# Patient Record
Sex: Female | Born: 1985
Health system: Southern US, Community
[De-identification: ages and names within clinical notes are randomized; demographics above are authoritative.]

## PROBLEM LIST (undated history)

## (undated) ENCOUNTER — Inpatient Hospital Stay (HOSPITAL_COMMUNITY): Payer: Self-pay

## (undated) DIAGNOSIS — R112 Nausea with vomiting, unspecified: Secondary | ICD-10-CM

## (undated) DIAGNOSIS — E063 Autoimmune thyroiditis: Secondary | ICD-10-CM

## (undated) DIAGNOSIS — F32A Depression, unspecified: Secondary | ICD-10-CM

## (undated) DIAGNOSIS — N979 Female infertility, unspecified: Secondary | ICD-10-CM

## (undated) DIAGNOSIS — K219 Gastro-esophageal reflux disease without esophagitis: Secondary | ICD-10-CM

## (undated) DIAGNOSIS — F458 Other somatoform disorders: Secondary | ICD-10-CM

## (undated) DIAGNOSIS — F419 Anxiety disorder, unspecified: Secondary | ICD-10-CM

## (undated) DIAGNOSIS — E349 Endocrine disorder, unspecified: Secondary | ICD-10-CM

## (undated) DIAGNOSIS — Z9889 Other specified postprocedural states: Secondary | ICD-10-CM

## (undated) DIAGNOSIS — E288 Other ovarian dysfunction: Secondary | ICD-10-CM

## (undated) DIAGNOSIS — N939 Abnormal uterine and vaginal bleeding, unspecified: Secondary | ICD-10-CM

## (undated) DIAGNOSIS — F329 Major depressive disorder, single episode, unspecified: Secondary | ICD-10-CM

## (undated) DIAGNOSIS — N912 Amenorrhea, unspecified: Secondary | ICD-10-CM

## (undated) HISTORY — DX: Endocrine disorder, unspecified: E34.9

## (undated) HISTORY — DX: Depression, unspecified: F32.A

## (undated) HISTORY — PX: WISDOM TOOTH EXTRACTION: SHX21

## (undated) HISTORY — DX: Amenorrhea, unspecified: N91.2

## (undated) HISTORY — PX: DILATION AND CURETTAGE OF UTERUS: SHX78

## (undated) HISTORY — PX: OTHER SURGICAL HISTORY: SHX169

## (undated) HISTORY — DX: Female infertility, unspecified: N97.9

## (undated) HISTORY — DX: Anxiety disorder, unspecified: F41.9

## (undated) HISTORY — PX: TONSILLECTOMY AND ADENOIDECTOMY: SUR1326

## (undated) HISTORY — PX: TONSILLECTOMY: SUR1361

## (undated) HISTORY — DX: Other ovarian dysfunction: E28.8

## (undated) HISTORY — DX: Abnormal uterine and vaginal bleeding, unspecified: N93.9

## (undated) HISTORY — DX: Major depressive disorder, single episode, unspecified: F32.9

---
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
## 2012-08-01 ENCOUNTER — Emergency Department (HOSPITAL_COMMUNITY)
Admission: EM | Admit: 2012-08-01 | Discharge: 2012-08-01 | Disposition: A | Discharge status: Home / Self Care | Payer: Managed Care, Other (non HMO) | Attending: Emergency Medicine | Admitting: Emergency Medicine

## 2012-08-01 ENCOUNTER — Encounter (HOSPITAL_COMMUNITY): Payer: Self-pay | Admitting: Emergency Medicine

## 2012-08-01 DIAGNOSIS — R0602 Shortness of breath: Secondary | ICD-10-CM | POA: Insufficient documentation

## 2012-08-01 DIAGNOSIS — R221 Localized swelling, mass and lump, neck: Secondary | ICD-10-CM | POA: Insufficient documentation

## 2012-08-01 DIAGNOSIS — R209 Unspecified disturbances of skin sensation: Secondary | ICD-10-CM | POA: Insufficient documentation

## 2012-08-01 DIAGNOSIS — R220 Localized swelling, mass and lump, head: Secondary | ICD-10-CM | POA: Insufficient documentation

## 2012-08-01 DIAGNOSIS — T7840XA Allergy, unspecified, initial encounter: Secondary | ICD-10-CM

## 2012-08-01 DIAGNOSIS — Z79899 Other long term (current) drug therapy: Secondary | ICD-10-CM | POA: Insufficient documentation

## 2012-08-01 DIAGNOSIS — R1310 Dysphagia, unspecified: Secondary | ICD-10-CM | POA: Insufficient documentation

## 2012-08-01 DIAGNOSIS — T4995XA Adverse effect of unspecified topical agent, initial encounter: Secondary | ICD-10-CM | POA: Insufficient documentation

## 2012-08-01 DIAGNOSIS — F17200 Nicotine dependence, unspecified, uncomplicated: Secondary | ICD-10-CM | POA: Insufficient documentation

## 2012-08-01 MED ORDER — PREDNISONE 20 MG PO TABS
60.0000 mg | ORAL_TABLET | Freq: Once | ORAL | Status: AC
  Administered 2012-08-01: 60 mg via ORAL
  Filled 2012-08-01: qty 3

## 2012-08-01 MED ORDER — EPINEPHRINE 0.3 MG/0.3ML IJ DEVI: 0.3000 mg | INTRAMUSCULAR | Status: DC | PRN

## 2012-08-01 MED ORDER — PREDNISONE 20 MG PO TABS: 60.0000 mg | ORAL_TABLET | Freq: Every day | ORAL | Status: DC

## 2012-08-01 NOTE — ED Provider Notes (Signed)
History     CSN: [id number]  Arrival date & time 08/01/12  0204   First MD Initiated Contact with Patient 08/01/12 [phone]      Chief Complaint  Patient presents with  . Allergic Reaction    (Consider location/radiation/quality/duration/timing/severity/associated sxs/prior treatment) Patient is a 26 y.o. female presenting with allergic reaction. The history is provided by the patient.  Allergic Reaction The primary symptoms are  shortness of breath. The primary symptoms do not include abdominal pain, nausea, vomiting, diarrhea or rash.   patient states around midnight today she developed swelling and numbness to her face. She states there was difficulty breathing and some difficulty swallowing. She states that she was walled lower jaw. She states that her jaw felt numb. She states she was watching TV what happened. She took Advil p.m. after it she states that she feels better now. No history of previous allergic reaction. No clear allergens. Patient states she has been under a lot of stress and does have a history of panic attack. No rash. She denies possibility of pregnancy.  History reviewed. No pertinent past medical history.  Past Surgical History  Procedure Date  . Tonsillectomy     No family history on file.  History  Substance Use Topics  . Smoking status: Current Some Day Smoker  . Smokeless tobacco: Not on file  . Alcohol Use: Yes     Comment: occasional    OB History    Grav Para Term Preterm Abortions TAB SAB Ect Mult Living                  Review of Systems  Constitutional: Negative for activity change and appetite change.  HENT: Positive for facial swelling and trouble swallowing. Negative for drooling and neck stiffness.   Eyes: Negative for pain.  Respiratory: Positive for shortness of breath. Negative for chest tightness.   Cardiovascular: Negative for chest pain and leg swelling.  Gastrointestinal: Negative for nausea, vomiting, abdominal pain and  diarrhea.  Genitourinary: Negative for flank pain.  Musculoskeletal: Negative for back pain.  Skin: Negative for rash.  Neurological: Negative for weakness, numbness and headaches.  Psychiatric/Behavioral: Negative for behavioral problems.    Allergies  Penicillins  Home Medications   Current Outpatient Rx  Name  Route  Sig  Dispense  Refill  . IBUPROFEN-DIPHENHYDRAMINE HCL 200-25 MG PO CAPS   Oral   Take 1 tablet by mouth at bedtime as needed. For sleep         . LORATADINE-PSEUDOEPHEDRINE ER 10-240 MG PO TB24   Oral   Take 1 tablet by mouth daily.         Marland Kitchen EPINEPHRINE 0.3 MG/0.3ML IJ DEVI   Intramuscular   Inject 0.3 mLs (0.3 mg total) into the muscle as needed.   2 Device   1   . PREDNISONE 20 MG PO TABS   Oral   Take 3 tablets (60 mg total) by mouth daily.   3 tablet   0     BP 131/98  Pulse 104  Temp 97.8 F (36.6 C) (Oral)  Resp 18  Ht 5\' 1"  (1.549 m)  Wt 215 lb (97.523 kg)  BMI 40.62 kg/m2  SpO2 98%  Physical Exam  Nursing note and vitals reviewed. Constitutional: She is oriented to person, place, and time. She appears well-developed and well-nourished.  HENT:  Head: Normocephalic and atraumatic.       Patient states that she is swollen the lower jaw. There is no clear  edema. No swelling of floor of mouth. Good dentition no stridor  Eyes: EOM are normal. Pupils are equal, round, and reactive to light.  Neck: Normal range of motion. Neck supple.  Cardiovascular: Normal rate, regular rhythm and normal heart sounds.   No murmur heard. Pulmonary/Chest: Effort normal and breath sounds normal. No respiratory distress. She has no wheezes. She has no rales.  Abdominal: Soft. Bowel sounds are normal. She exhibits no distension. There is no tenderness. There is no rebound and no guarding.  Musculoskeletal: Normal range of motion.  Neurological: She is alert and oriented to person, place, and time. No cranial nerve deficit.  Skin: Skin is warm and dry.    Psychiatric: She has a normal mood and affect. Her speech is normal.    ED Course  Procedures (including critical care time)  Labs Reviewed - No data to display No results found.   1. Allergic reaction       MDM  Patient with possible allergic reaction. Patient states that her jaw and upper neck was swollen. No clear visible swelling now. We'll treat patient has allergic reaction to the possible swelling. Posterior pharynx is normal. She has no stridor. She had had Benadryl prior to arrival. She was given steroids here and a dose for tomorrow. She's given EpiPen to follow with an allergist        Juliet Rude. Rubin Payor, MD 08/01/12 [phone]

## 2012-08-01 NOTE — ED Notes (Signed)
Patient is alert and oriented x3.  She was given DC instructions and follow up visit instructions.  Patient gave verbal understanding. She was DC ambulatory under her own power to home.  V/S stable.  He was not showing any signs of distress on DC 

## 2012-08-01 NOTE — ED Notes (Signed)
MD at bedside. 

## 2012-08-01 NOTE — ED Notes (Signed)
Pt c/o swelling and numbness to throat and face x 2 hours, pt states it is now difficult to take a deep breath. Unsure of allergin, pt states she took Advil PM @ midnight,

## 2013-02-10 ENCOUNTER — Encounter: Payer: Self-pay | Admitting: Family Medicine

## 2013-02-10 ENCOUNTER — Ambulatory Visit (INDEPENDENT_AMBULATORY_CARE_PROVIDER_SITE_OTHER): Payer: Managed Care, Other (non HMO) | Admitting: Family Medicine

## 2013-02-10 VITALS — BP 116/64 | HR 90 | Temp 98.6°F | Wt 234.0 lb

## 2013-02-10 DIAGNOSIS — R1013 Epigastric pain: Secondary | ICD-10-CM

## 2013-02-10 DIAGNOSIS — K219 Gastro-esophageal reflux disease without esophagitis: Secondary | ICD-10-CM

## 2013-02-10 DIAGNOSIS — R1011 Right upper quadrant pain: Secondary | ICD-10-CM

## 2013-02-10 LAB — BASIC METABOLIC PANEL
BUN: 16 mg/dL (ref 6–23)
CO2: 31 mEq/L (ref 19–32)
Calcium: 9.5 mg/dL (ref 8.4–10.5)
Chloride: 105 mEq/L (ref 96–112)
Creatinine, Ser: 0.7 mg/dL (ref 0.4–1.2)
GFR: 116.93 mL/min (ref >60.00)
Glucose, Bld: 90 mg/dL (ref 70–99)
Potassium: 4.4 mEq/L (ref 3.5–5.1)
Sodium: 142 mEq/L (ref 135–145)

## 2013-02-10 LAB — HEPATIC FUNCTION PANEL
ALT: 33 U/L (ref 0–35)
AST: 15 U/L (ref 0–37)
Albumin: 4 g/dL (ref 3.5–5.2)
Alkaline Phosphatase: 55 U/L (ref 39–117)
Bilirubin, Direct: 0 mg/dL (ref 0.0–0.3)
Total Bilirubin: 0.5 mg/dL (ref 0.3–1.2)
Total Protein: 7.1 g/dL (ref 6.0–8.3)

## 2013-02-10 LAB — CBC WITH DIFFERENTIAL/PLATELET
Basophils Absolute: 0.1 10*3/uL (ref 0.0–0.1)
Basophils Relative: 0.6 % (ref 0.0–3.0)
Eosinophils Absolute: 0.3 10*3/uL (ref 0.0–0.7)
Eosinophils Relative: 3.7 % (ref 0.0–5.0)
HCT: 44.5 % (ref 36.0–46.0)
Hemoglobin: 15 g/dL (ref 12.0–15.0)
Lymphocytes Relative: 29.1 % (ref 12.0–46.0)
Lymphs Abs: 2.5 10*3/uL (ref 0.7–4.0)
MCHC: 33.6 g/dL (ref 30.0–36.0)
MCV: 92 fl (ref 78.0–100.0)
Monocytes Absolute: 0.5 10*3/uL (ref 0.1–1.0)
Monocytes Relative: 5.8 % (ref 3.0–12.0)
Neutro Abs: 5.2 10*3/uL (ref 1.4–7.7)
Neutrophils Relative %: 60.8 % (ref 43.0–77.0)
Platelets: 231 10*3/uL (ref 150.0–400.0)
RBC: 4.84 Mil/uL (ref 3.87–5.11)
RDW: 12 % (ref 11.5–14.6)
WBC: 8.5 10*3/uL (ref 4.5–10.5)

## 2013-02-10 LAB — H. PYLORI ANTIBODY, IGG: H Pylori IgG: NEGATIVE

## 2013-02-10 MED ORDER — GI COCKTAIL ~~LOC~~
30.0000 mL | Freq: Once | ORAL | Status: AC
  Administered 2013-02-10: 30 mL via ORAL

## 2013-02-10 MED ORDER — OMEPRAZOLE MAGNESIUM 20 MG PO TBEC: 20.0000 mg | DELAYED_RELEASE_TABLET | Freq: Every day | ORAL | Status: DC

## 2013-02-10 MED ORDER — GI COCKTAIL ~~LOC~~: 30.0000 mL | Freq: Once | ORAL | Status: DC

## 2013-02-10 NOTE — Patient Instructions (Addendum)

## 2013-02-10 NOTE — Progress Notes (Signed)
  Subjective:     Jessica Silva is an 27 y.o. female who presents for evaluation of heartburn. This has been associated with belching, deep pressure at base of neck, difficulty swallowing, heartburn and midespigastric pain. She denies bilious reflux, chest pain, choking on food, fullness after meals, hematemesis, hoarseness, laryngitis, melena and nausea. Symptoms have been present for several days. She has had dysphagia for solids. She has not lost weight. She denies melena, hematochezia, hematemesis, and coffee ground emesis. Medical therapy in the past has included: antacids.  The following portions of the patient's history were reviewed and updated as appropriate:  She  has no past medical history on file. She  does not have a problem list on file. She  has past surgical history that includes Tonsillectomy. Her family history includes Hiatal hernia in her maternal grandmother and mother; Hypertension in her sister; and Irritable bowel syndrome in her brothers. She  reports that she has been smoking.  She does not have any smokeless tobacco history on file. She reports that she drinks about 1.2 ounces of alcohol per week. She reports that she does not use illicit drugs. She has a current medication list which includes the following prescription(s): epinephrine, loratadine-pseudoephedrine, and omeprazole. Current Outpatient Prescriptions on File Prior to Visit  Medication Sig Dispense Refill  . EPINEPHrine (EPIPEN) 0.3 mg/0.3 mL DEVI Inject 0.3 mLs (0.3 mg total) into the muscle as needed.  2 Device  1  . loratadine-pseudoephedrine (CLARITIN-D 24-HOUR) 10-240 MG per 24 hr tablet Take 1 tablet by mouth daily.       No current facility-administered medications on file prior to visit.   She is allergic to penicillins..  Review of Systems Pertinent items are noted in HPI.   Objective:     BP 116/64  Pulse 90  Temp(Src) 98.6 F (37 C) (Oral)  Wt 234 lb (106.142 kg)  BMI 44.24 kg/m2  SpO2  97% General appearance: alert, cooperative, appears stated age and no distress Throat: lips, mucosa, and tongue normal; teeth and gums normal Lungs: clear to auscultation bilaterally Heart: S1, S2 normal Abdomen: abnormal findings:  moderate tenderness in the epigastrium   Assessment:    Gastroesophageal Reflux Disease     Plan:    Nonpharmacologic treatments were discussed including: eating smaller meals, elevation of the head of bed at night, avoidance of caffeine, chocolate, nicotine and peppermint, and avoiding tight fitting clothing. Will start a trial of proton pump inhibitors. Follow up in a few weeks or sooner as needed. [place] ordered  To ER if pain worsens

## 2013-02-11 ENCOUNTER — Encounter: Payer: Self-pay | Admitting: Family Medicine

## 2013-02-11 MED ORDER — ALPRAZOLAM 0.25 MG PO TABS: 0.2500 mg | ORAL_TABLET | Freq: Three times a day (TID) | ORAL | Status: DC | PRN

## 2013-02-12 ENCOUNTER — Encounter: Payer: Self-pay | Admitting: General Practice

## 2013-02-22 ENCOUNTER — Encounter: Payer: Self-pay | Admitting: Family Medicine

## 2013-03-24 ENCOUNTER — Ambulatory Visit: Payer: Managed Care, Other (non HMO) | Admitting: Family Medicine

## 2013-04-30 ENCOUNTER — Other Ambulatory Visit: Payer: Self-pay | Admitting: Family Medicine

## 2013-04-30 MED ORDER — ALPRAZOLAM 0.25 MG PO TABS: 0.2500 mg | ORAL_TABLET | Freq: Three times a day (TID) | ORAL | Status: DC | PRN

## 2013-04-30 NOTE — Telephone Encounter (Signed)
Last seen 02/10/13 and filled 02/11/13 #60. Please advise      KP

## 2013-05-02 ENCOUNTER — Encounter: Payer: Self-pay | Admitting: Family Medicine

## 2013-06-14 ENCOUNTER — Encounter: Payer: Managed Care, Other (non HMO) | Admitting: Family Medicine

## 2013-06-15 ENCOUNTER — Encounter: Payer: Self-pay | Admitting: Lab

## 2013-06-15 ENCOUNTER — Telehealth: Payer: Self-pay

## 2013-06-15 NOTE — Telephone Encounter (Signed)
LM for CB  HM reviewed. Due as noted:  PAP ? Flu Vaccine

## 2013-06-16 ENCOUNTER — Encounter: Payer: Managed Care, Other (non HMO) | Admitting: Family Medicine

## 2013-06-16 DIAGNOSIS — Z0289 Encounter for other administrative examinations: Secondary | ICD-10-CM

## 2013-06-16 NOTE — Telephone Encounter (Signed)
Unable to reach prior to visit  

## 2013-07-14 ENCOUNTER — Encounter: Payer: Self-pay | Admitting: Gastroenterology

## 2013-07-14 ENCOUNTER — Encounter: Payer: Self-pay | Admitting: Family Medicine

## 2013-07-14 ENCOUNTER — Ambulatory Visit (INDEPENDENT_AMBULATORY_CARE_PROVIDER_SITE_OTHER): Payer: Managed Care, Other (non HMO) | Admitting: Family Medicine

## 2013-07-14 VITALS — BP 110/78 | HR 111 | Temp 97.0°F | Wt 252.0 lb

## 2013-07-14 DIAGNOSIS — R109 Unspecified abdominal pain: Secondary | ICD-10-CM

## 2013-07-14 DIAGNOSIS — R635 Abnormal weight gain: Secondary | ICD-10-CM

## 2013-07-14 LAB — CBC WITH DIFFERENTIAL/PLATELET
Basophils Absolute: 0 10*3/uL (ref 0.0–0.1)
Basophils Relative: 0.5 % (ref 0.0–3.0)
Eosinophils Absolute: 0.4 10*3/uL (ref 0.0–0.7)
Eosinophils Relative: 5.1 % — ABNORMAL HIGH (ref 0.0–5.0)
HCT: 42.1 % (ref 36.0–46.0)
Hemoglobin: 14.4 g/dL (ref 12.0–15.0)
Lymphocytes Relative: 33.5 % (ref 12.0–46.0)
Lymphs Abs: 2.5 10*3/uL (ref 0.7–4.0)
MCHC: 34.1 g/dL (ref 30.0–36.0)
MCV: 89.8 fl (ref 78.0–100.0)
Monocytes Absolute: 0.4 10*3/uL (ref 0.1–1.0)
Monocytes Relative: 5.5 % (ref 3.0–12.0)
Neutro Abs: 4.1 10*3/uL (ref 1.4–7.7)
Neutrophils Relative %: 55.4 % (ref 43.0–77.0)
Platelets: 247 10*3/uL (ref 150.0–400.0)
RBC: 4.68 Mil/uL (ref 3.87–5.11)
RDW: 12.1 % (ref 11.5–14.6)
WBC: 7.4 10*3/uL (ref 4.5–10.5)

## 2013-07-14 LAB — HEPATIC FUNCTION PANEL
ALT: 49 U/L — ABNORMAL HIGH (ref 0–35)
AST: 25 U/L (ref 0–37)
Albumin: 4 g/dL (ref 3.5–5.2)
Alkaline Phosphatase: 55 U/L (ref 39–117)
Bilirubin, Direct: 0 mg/dL (ref 0.0–0.3)
Total Bilirubin: 0.3 mg/dL (ref 0.3–1.2)
Total Protein: 7 g/dL (ref 6.0–8.3)

## 2013-07-14 LAB — BASIC METABOLIC PANEL
BUN: 12 mg/dL (ref 6–23)
CO2: 27 mEq/L (ref 19–32)
Calcium: 9.2 mg/dL (ref 8.4–10.5)
Chloride: 104 mEq/L (ref 96–112)
Creatinine, Ser: 0.7 mg/dL (ref 0.4–1.2)
GFR: 100.35 mL/min (ref >60.00)
Glucose, Bld: 86 mg/dL (ref 70–99)
Potassium: 4 mEq/L (ref 3.5–5.1)
Sodium: 139 mEq/L (ref 135–145)

## 2013-07-14 LAB — TSH: TSH: 0.67 u[IU]/mL (ref 0.35–5.50)

## 2013-07-14 LAB — T4, FREE: Free T4: 0.9 ng/dL (ref 0.60–1.60)

## 2013-07-14 LAB — T3, FREE: T3, Free: 3 pg/mL (ref 2.3–4.2)

## 2013-07-14 MED ORDER — OMEPRAZOLE 40 MG PO CPDR: 40.0000 mg | DELAYED_RELEASE_CAPSULE | Freq: Every day | ORAL | Status: DC

## 2013-07-14 NOTE — Progress Notes (Signed)
  Subjective:     Jessica Silva is a 27 y.o. female who presents for evaluation of abdominal pain. Onset was 5 months ago. Symptoms have been gradually worsening. The pain is described as aching, sharp and bloated feeling, and is 9/10 in intensity. Pain is located in the RUQ, LUQ and epigastric region without radiation.  Aggravating factors: carbs /sugar.  Alleviating factors: antacids. Associated symptoms: belching, diarrhea, flatus and nausea. The patient denies anorexia, arthralagias, chills, constipation, dysuria, fever, frequency, headache, hematochezia, hematuria, melena, myalgias, sweats and vomiting.  The patient's history has been marked as reviewed and updated as appropriate.  Review of Systems Pertinent items are noted in HPI.     Objective:    BP 110/78  Pulse 111  Temp(Src) 97 F (36.1 C) (Oral)  Wt 252 lb (114.306 kg)  SpO2 97% General appearance: alert, cooperative, appears stated age and no distress Throat: lips, mucosa, and tongue normal; teeth and gums normal Lungs: clear to auscultation bilaterally Heart: S1, S2 normal Abdomen: abnormal findings:  moderate tenderness in the epigastrium, in the RUQ and in the LUQ    Assessment:    Abdominal pan---  ? gerd .    Plan:    See orders for lab and imaging studies. Adhere to simple, bland diet. Initiate empiric trial of acid suppression; see orders. Further follow-up plans will be based on outcome of lab/imaging studies; see orders. refer GI

## 2013-07-14 NOTE — Assessment & Plan Note (Signed)
Check labs D/w pt diet and exercise

## 2013-07-14 NOTE — Patient Instructions (Addendum)
F/u prn   Diet for Gastroesophageal Reflux Disease, Adult Reflux (acid reflux) is when acid from your stomach flows up into the esophagus. When acid comes in contact with the esophagus, the acid causes irritation and soreness (inflammation) in the esophagus. When reflux happens often or so severely that it causes damage to the esophagus, it is called gastroesophageal reflux disease (GERD). Nutrition therapy can help ease the discomfort of GERD. FOODS OR DRINKS TO AVOID OR LIMIT  Smoking or chewing tobacco. Nicotine is one of the most potent stimulants to acid production in the gastrointestinal tract.  Caffeinated and decaffeinated coffee and black tea.  Regular or low-calorie carbonated beverages or energy drinks (caffeine-free carbonated beverages are allowed).   Strong spices, such as black pepper, white pepper, red pepper, cayenne, curry powder, and chili powder.  Peppermint or spearmint.  Chocolate.  High-fat foods, including meats and fried foods. Extra added fats including oils, butter, salad dressings, and nuts. Limit these to less than 8 tsp per day.  Fruits and vegetables if they are not tolerated, such as citrus fruits or tomatoes.  Alcohol.  Any food that seems to aggravate your condition. If you have questions regarding your diet, call your caregiver or a registered dietitian. OTHER THINGS THAT MAY HELP GERD INCLUDE:   Eating your meals slowly, in a relaxed setting.  Eating 5 to 6 small meals per day instead of 3 large meals.  Eliminating food for a period of time if it causes distress.  Not lying down until 3 hours after eating a meal.  Keeping the head of your bed raised 6 to 9 inches (15 to 23 cm) by using a foam wedge or blocks under the legs of the bed. Lying flat may make symptoms worse.  Being physically active. Weight loss may be helpful in reducing reflux in overweight or obese adults.  Wear loose fitting clothing EXAMPLE MEAL PLAN This meal plan is  approximately 2,000 calories based on https://www.bernard.org/ meal planning guidelines. Breakfast   cup cooked oatmeal.  1 cup strawberries.  1 cup low-fat milk.  1 oz almonds. Snack  1 cup cucumber slices.  6 oz yogurt (made from low-fat or fat-free milk). Lunch  2 slice whole-wheat bread.  2 oz sliced [place].  2 tsp mayonnaise.  1 cup blueberries.  1 cup snap peas. Snack  6 whole-wheat crackers.  1 oz string cheese. Dinner   cup brown rice.  1 cup mixed veggies.  1 tsp olive oil.  3 oz grilled fish. Document Released: 08/26/2005 Document Revised: 11/18/2011 Document Reviewed: 07/12/2011 [facility] Patient Information 2014 [place], [place].

## 2013-07-15 ENCOUNTER — Other Ambulatory Visit: Payer: Self-pay

## 2013-08-11 ENCOUNTER — Telehealth: Payer: Self-pay | Admitting: Gastroenterology

## 2013-08-11 ENCOUNTER — Ambulatory Visit: Payer: Managed Care, Other (non HMO) | Admitting: Gastroenterology

## 2013-08-11 NOTE — Telephone Encounter (Signed)
No charge this time. 

## 2013-08-11 NOTE — Telephone Encounter (Signed)
Do you want to charge? 

## 2013-09-13 ENCOUNTER — Ambulatory Visit: Payer: Managed Care, Other (non HMO) | Admitting: Gastroenterology

## 2013-10-21 ENCOUNTER — Encounter: Payer: Self-pay | Admitting: Family Medicine

## 2014-01-21 ENCOUNTER — Telehealth: Payer: Self-pay | Admitting: *Deleted

## 2014-01-21 ENCOUNTER — Other Ambulatory Visit: Payer: Self-pay | Admitting: Family Medicine

## 2014-01-21 NOTE — Telephone Encounter (Signed)
Xanax .25mg   Last OV- 07/14/13 Last refilled- 8//22/14 #60 / 0 rf  UDS- 08/10/13 contract.

## 2014-01-24 MED ORDER — ALPRAZOLAM 0.25 MG PO TABS: 0.2500 mg | ORAL_TABLET | Freq: Three times a day (TID) | ORAL | Status: DC | PRN

## 2014-01-24 NOTE — Telephone Encounter (Signed)
Last seen 07/14/13 and filled 04/30/13 #60 No UDS but contract has been signed.  Please advise      KP

## 2014-01-24 NOTE — Telephone Encounter (Signed)
Refill x1 

## 2014-01-24 NOTE — Telephone Encounter (Signed)
RX faxed to [facility] [address]    KP

## 2014-01-24 NOTE — Telephone Encounter (Signed)
duplicate

## 2014-01-25 ENCOUNTER — Other Ambulatory Visit: Payer: Self-pay | Admitting: Family Medicine

## 2014-01-25 NOTE — Telephone Encounter (Signed)
I called pharmacy and tested the fax machine and although we get a confirmation on our end the Medications are not going through to [facility]. Rx for Xanax 0.25 1 po tid #60 with no refills has been phoned in to the pharmacy.      KP

## 2014-11-09 ENCOUNTER — Telehealth: Payer: Self-pay | Admitting: Family Medicine

## 2014-11-09 ENCOUNTER — Telehealth: Payer: Self-pay | Admitting: *Deleted

## 2014-11-09 NOTE — Telephone Encounter (Signed)
Pt needs ov. 

## 2014-11-09 NOTE — Telephone Encounter (Signed)
Appointment Request From: Delle ReiningJena Shaver       With Provider: Loreen FreudYvonne Lowne, DO [-Primary Care Physician-]      Preferred Date Range: From 11/11/2014 To 11/11/2014      Preferred Times: Friday Morning, Friday Afternoon        Reason for visit: Office Visit      Comments:   I've been feeling really strange lately and I cannot tell if it is stress related or something more serious. I am planning a wedding, so stress is at a high, but my heart rate has been consistently over 100 for the past week or so. I've felt lightheaded and shaky at times with headaches frequently.      Forwarded this on to Margaretmary DysAshley L., RN

## 2014-11-09 NOTE — Telephone Encounter (Signed)
FYI Patient left MyChart note stating:  Comments:    I've been feeling really strange lately and I cannot tell if it is stress related or something more serious. I am planning a wedding, so stress is at a high, but my heart rate has been consistently over 100 for the past week or so. I've felt lightheaded and shaky at times with headaches frequently.        Called patient and she denies lightheadedness or shortness of breath at the moment.  She reports her HR has been 110-115 for several months.  No chest pain, blurred vision.  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  She recently quit smoking (6 mo ago) and this is around the time symptoms began.  She states she is experiencing a lot of stress with job and wedding.  She is staying hydrated and has no diet or medication changes, but does report that she feels lightheaded after eating.  She reports that she ran out of Xanax 6 months ago as well which she was using for acute anxiety.   Appt made for Friday 11/11/14 at 4pm.

## 2014-11-11 ENCOUNTER — Telehealth: Payer: Self-pay | Admitting: Family Medicine

## 2014-11-11 ENCOUNTER — Encounter: Payer: Self-pay | Admitting: Family Medicine

## 2014-11-11 ENCOUNTER — Ambulatory Visit (INDEPENDENT_AMBULATORY_CARE_PROVIDER_SITE_OTHER): Payer: 59 | Admitting: Family Medicine

## 2014-11-11 VITALS — BP 108/60 | HR 112 | Temp 98.6°F | Wt 269.4 lb

## 2014-11-11 DIAGNOSIS — F419 Anxiety disorder, unspecified: Secondary | ICD-10-CM

## 2014-11-11 DIAGNOSIS — E569 Vitamin deficiency, unspecified: Secondary | ICD-10-CM

## 2014-11-11 LAB — CBC WITH DIFFERENTIAL/PLATELET
Basophils Absolute: 0 10*3/uL (ref 0.0–0.1)
Basophils Relative: 0 % (ref 0–1)
Eosinophils Absolute: 0.2 10*3/uL (ref 0.0–0.7)
Eosinophils Relative: 2 % (ref 0–5)
HCT: 42.7 % (ref 36.0–46.0)
Hemoglobin: 14.5 g/dL (ref 12.0–15.0)
Lymphocytes Relative: 39 % (ref 12–46)
Lymphs Abs: 3.1 10*3/uL (ref 0.7–4.0)
MCH: 30 pg (ref 26.0–34.0)
MCHC: 34 g/dL (ref 30.0–36.0)
MCV: 88.4 fL (ref 78.0–100.0)
MPV: 9.6 fL (ref 8.6–12.4)
Monocytes Absolute: 0.5 10*3/uL (ref 0.1–1.0)
Monocytes Relative: 6 % (ref 3–12)
Neutro Abs: 4.2 10*3/uL (ref 1.7–7.7)
Neutrophils Relative %: 53 % (ref 43–77)
Platelets: 277 10*3/uL (ref 150–400)
RBC: 4.83 MIL/uL (ref 3.87–5.11)
RDW: 12.5 % (ref 11.5–15.5)
WBC: 8 10*3/uL (ref 4.0–10.5)

## 2014-11-11 MED ORDER — ALPRAZOLAM 0.25 MG PO TABS: 0.2500 mg | ORAL_TABLET | Freq: Three times a day (TID) | ORAL | Status: DC | PRN

## 2014-11-11 NOTE — Progress Notes (Signed)
Pre visit review using our clinic review tool, if applicable. No additional management support is needed unless otherwise documented below in the visit note. 

## 2014-11-11 NOTE — Patient Instructions (Signed)
Generalized Anxiety Disorder Generalized anxiety disorder (GAD) is a mental disorder. It interferes with life functions, including relationships, work, and school. GAD is different from normal anxiety, which everyone experiences at some point in their lives in response to specific life events and activities. Normal anxiety actually helps us prepare for and get through these life events and activities. Normal anxiety goes away after the event or activity is over.  GAD causes anxiety that is not necessarily related to specific events or activities. It also causes excess anxiety in proportion to specific events or activities. The anxiety associated with GAD is also difficult to control. GAD can vary from mild to severe. People with severe GAD can have intense waves of anxiety with physical symptoms (panic attacks).  SYMPTOMS The anxiety and worry associated with GAD are difficult to control. This anxiety and worry are related to many life events and activities and also occur more days than not for 6 months or longer. People with GAD also have three or more of the following symptoms (one or more in children):  Restlessness.   Fatigue.  Difficulty concentrating.   Irritability.  Muscle tension.  Difficulty sleeping or unsatisfying sleep. DIAGNOSIS GAD is diagnosed through an assessment by your health care provider. Your health care provider will ask you questions aboutyour mood,physical symptoms, and events in your life. Your health care provider may ask you about your medical history and use of alcohol or drugs, including prescription medicines. Your health care provider may also do a physical exam and blood tests. Certain medical conditions and the use of certain substances can cause symptoms similar to those associated with GAD. Your health care provider may refer you to a mental health specialist for further evaluation. TREATMENT The following therapies are usually used to treat GAD:    Medication. Antidepressant medication usually is prescribed for long-term daily control. Antianxiety medicines may be added in severe cases, especially when panic attacks occur.   Talk therapy (psychotherapy). Certain types of talk therapy can be helpful in treating GAD by providing support, education, and guidance. A form of talk therapy called cognitive behavioral therapy can teach you healthy ways to think about and react to daily life events and activities.  Stress managementtechniques. These include yoga, meditation, and exercise and can be very helpful when they are practiced regularly. A mental health specialist can help determine which treatment is best for you. Some people see improvement with one therapy. However, other people require a combination of therapies. Document Released: 12/21/2012 Document Revised: 01/10/2014 Document Reviewed: 12/21/2012 [employer] Patient Information 2015 [employer]. This information is not intended to replace advice given to you by your health care provider. Make sure you discuss any questions you have with your health care provider.  

## 2014-11-11 NOTE — Telephone Encounter (Signed)
emmi emailed °

## 2014-11-12 LAB — HEPATIC FUNCTION PANEL
ALT: 40 U/L — ABNORMAL HIGH (ref 0–35)
AST: 26 U/L (ref 0–37)
Albumin: 4.3 g/dL (ref 3.5–5.2)
Alkaline Phosphatase: 65 U/L (ref 39–117)
Bilirubin, Direct: 0.1 mg/dL (ref 0.0–0.3)
Indirect Bilirubin: 0.2 mg/dL (ref 0.2–1.2)
Total Bilirubin: 0.3 mg/dL (ref 0.2–1.2)
Total Protein: 6.7 g/dL (ref 6.0–8.3)

## 2014-11-12 LAB — BASIC METABOLIC PANEL
BUN: 11 mg/dL (ref 6–23)
CO2: 26 mEq/L (ref 19–32)
Calcium: 9.4 mg/dL (ref 8.4–10.5)
Chloride: 104 mEq/L (ref 96–112)
Creat: 0.74 mg/dL (ref 0.50–1.10)
Glucose, Bld: 90 mg/dL (ref 70–99)
Potassium: 3.9 mEq/L (ref 3.5–5.3)
Sodium: 140 mEq/L (ref 135–145)

## 2014-11-12 LAB — VITAMIN B12: Vitamin B-12: 1123 pg/mL — ABNORMAL HIGH (ref 211–911)

## 2014-11-12 LAB — THYROID PANEL WITH TSH
Free Thyroxine Index: 2 (ref 1.4–3.8)
T3 Uptake: 28 % (ref 22–35)
T4, Total: 7 ug/dL (ref 4.5–12.0)
TSH: 2.483 u[IU]/mL (ref 0.350–4.500)

## 2014-11-13 NOTE — Progress Notes (Signed)
Patient ID: Jessica Silva, female    DOB: 07-02-1986  Age: 29 y.o. MRN: [id number]    Subjective:  Subjective HPI Jessica Silva presents for f/u anxiety.  She is complaing about stress over planning a wedding  Review of Systems  Constitutional: Negative for activity change, appetite change, fatigue and unexpected weight change.  Respiratory: Negative for cough and shortness of breath.   Cardiovascular: Negative for chest pain and palpitations.  Psychiatric/Behavioral: Negative for behavioral problems and dysphoric mood. The patient is not nervous/anxious.     History History reviewed. No pertinent past medical history.  She has past surgical history that includes Tonsillectomy.   Her family history includes Hiatal hernia in her maternal grandmother and mother; Hypertension in her sister; Irritable bowel syndrome in her brother and brother.She reports that she has been smoking.  She does not have any smokeless tobacco history on file. She reports that she drinks about 1.2 oz of alcohol per week. She reports that she does not use illicit drugs.  Current Outpatient Prescriptions on File Prior to Visit  Medication Sig Dispense Refill  . EPINEPHrine (EPIPEN) 0.3 mg/0.3 mL DEVI Inject 0.3 mLs (0.3 mg total) into the muscle as needed. (Patient not taking: Reported on 11/11/2014) 2 Device 1  . loratadine-pseudoephedrine (CLARITIN-D 24-HOUR) 10-240 MG per 24 hr tablet Take 1 tablet by mouth daily.     No current facility-administered medications on file prior to visit.     Objective:  Objective Physical Exam  Constitutional: She is oriented to person, place, and time. She appears well-developed and well-nourished. No distress.  HENT:  Right Ear: External ear normal.  Left Ear: External ear normal.  Nose: Nose normal.  Mouth/Throat: Oropharynx is clear and moist.  Eyes: EOM are normal. Pupils are equal, round, and reactive to light.  Neck: Normal range of motion. Neck supple.  Cardiovascular:  Normal rate, regular rhythm and normal heart sounds.   No murmur heard. Pulmonary/Chest: Effort normal and breath sounds normal. No respiratory distress. She has no wheezes. She has no rales. She exhibits no tenderness.  Neurological: She is alert and oriented to person, place, and time.  Psychiatric: She has a normal mood and affect. Her behavior is normal. Judgment and thought content normal.   BP 108/60 mmHg  Pulse 112  Temp(Src) 98.6 F (37 C) (Oral)  Wt 269 lb 6.4 oz (122.199 kg)  SpO2 97% Wt Readings from Last 3 Encounters:  11/11/14 269 lb 6.4 oz (122.199 kg)  07/14/13 252 lb (114.306 kg)  02/10/13 234 lb (106.142 kg)     Lab Results  Component Value Date   WBC 8.0 11/11/2014   HGB 14.5 11/11/2014   HCT 42.7 11/11/2014   PLT 277 11/11/2014   GLUCOSE 90 11/11/2014   ALT 40* 11/11/2014   AST 26 11/11/2014   NA 140 11/11/2014   K 3.9 11/11/2014   CL 104 11/11/2014   CREATININE 0.74 11/11/2014   BUN 11 11/11/2014   CO2 26 11/11/2014   TSH 2.483 11/11/2014    No results found.   Assessment & Plan:  Plan I have discontinued Jessica Silva's omeprazole. I am also having her maintain her loratadine-pseudoephedrine, EPINEPHrine, and ALPRAZolam.  Meds ordered this encounter  Medications  . ALPRAZolam (XANAX) 0.25 MG tablet    Sig: Take 1 tablet (0.25 mg total) by mouth 3 (three) times daily as needed for sleep.    Dispense:  60 tablet    Refill:  0    Problem List Items  Addressed This Visit    None    Visit Diagnoses    Anxiety disorder, unspecified anxiety disorder type    -  Primary    Relevant Medications    ALPRAZolam  Prudy Feeler) tablet    Other Relevant Orders    Basic metabolic panel (Completed)    CBC with Differential/Platelet (Completed)    Hepatic function panel (Completed)    Thyroid Panel With TSH (Completed)    B12 (Completed)    Vitamin deficiency        Relevant Orders    B12 (Completed)       Follow-up: Return in about 2 months (around  01/11/2015), or if symptoms worsen or fail to improve.  Loreen Freud, DO

## 2015-01-13 ENCOUNTER — Ambulatory Visit: Payer: 59 | Admitting: Family Medicine

## 2015-05-01 ENCOUNTER — Other Ambulatory Visit: Payer: Self-pay | Admitting: Family Medicine

## 2015-05-01 DIAGNOSIS — F419 Anxiety disorder, unspecified: Secondary | ICD-10-CM

## 2015-05-02 MED ORDER — ALPRAZOLAM 0.25 MG PO TABS: 0.2500 mg | ORAL_TABLET | Freq: Three times a day (TID) | ORAL | Status: DC | PRN

## 2015-05-02 NOTE — Telephone Encounter (Signed)
Last seen and filled 11/11/14 #60 UDS 07/14/13 low risk   Please advise     KP

## 2015-06-12 ENCOUNTER — Ambulatory Visit: Payer: Self-pay | Admitting: Family Medicine

## 2015-07-17 ENCOUNTER — Ambulatory Visit (INDEPENDENT_AMBULATORY_CARE_PROVIDER_SITE_OTHER): Payer: BLUE CROSS/BLUE SHIELD | Admitting: Family Medicine

## 2015-07-17 ENCOUNTER — Encounter: Payer: Self-pay | Admitting: Family Medicine

## 2015-07-17 VITALS — BP 110/80 | HR 98 | Temp 98.9°F | Ht 60.0 in | Wt 256.0 lb

## 2015-07-17 DIAGNOSIS — R1310 Dysphagia, unspecified: Secondary | ICD-10-CM | POA: Diagnosis not present

## 2015-07-17 DIAGNOSIS — F329 Major depressive disorder, single episode, unspecified: Secondary | ICD-10-CM | POA: Diagnosis not present

## 2015-07-17 DIAGNOSIS — N912 Amenorrhea, unspecified: Secondary | ICD-10-CM | POA: Diagnosis not present

## 2015-07-17 DIAGNOSIS — F32A Depression, unspecified: Secondary | ICD-10-CM

## 2015-07-17 DIAGNOSIS — F339 Major depressive disorder, recurrent, unspecified: Secondary | ICD-10-CM | POA: Insufficient documentation

## 2015-07-17 LAB — POCT URINE PREGNANCY: Preg Test, Ur: NEGATIVE

## 2015-07-17 MED ORDER — BUPROPION HCL ER (XL) 300 MG PO TB24: 300.0000 mg | ORAL_TABLET | Freq: Every day | ORAL | Status: DC

## 2015-07-17 MED ORDER — PANTOPRAZOLE SODIUM 40 MG PO TBEC
40.0000 mg | DELAYED_RELEASE_TABLET | Freq: Every day | ORAL | Status: DC
Start: 2015-07-17 — End: 2015-08-15

## 2015-07-17 MED ORDER — BUPROPION HCL ER (XL) 150 MG PO TB24: ORAL_TABLET | ORAL | Status: DC

## 2015-07-17 NOTE — Progress Notes (Signed)
Pre visit review using our clinic review tool, if applicable. No additional management support is needed unless otherwise documented below in the visit note. 

## 2015-07-17 NOTE — Assessment & Plan Note (Signed)
Start wellbutrin F/u 4-6 weekws

## 2015-07-17 NOTE — Patient Instructions (Signed)
Bupropion extended-release tablets (Depression/Mood Disorders)  What is this medicine?  BUPROPION (byoo PROE pee on) is used to treat depression.  This medicine may be used for other purposes; ask your health care provider or pharmacist if you have questions.  What should I tell my health care provider before I take this medicine?  They need to know if you have any of these conditions:  -an eating disorder, such as anorexia or bulimia  -bipolar disorder or psychosis  -diabetes or high blood sugar, treated with medication  -glaucoma  -head injury or brain tumor  -heart disease, previous heart attack, or irregular heart beat  -high blood pressure  -kidney or liver disease  -seizures (convulsions)  -suicidal thoughts or a previous suicide attempt  -Tourette's syndrome  -weight loss  -an unusual or allergic reaction to bupropion, other medicines, foods, dyes, or preservatives  -breast-feeding  -pregnant or trying to become pregnant  How should I use this medicine?  Take this medicine by mouth with a glass of water. Follow the directions on the prescription label. You can take it with or without food. If it upsets your stomach, take it with food. Do not crush, chew, or cut these tablets. This medicine is taken once daily at the same time each day. Do not take your medicine more often than directed. Do not stop taking this medicine suddenly except upon the advice of your doctor. Stopping this medicine too quickly may cause serious side effects or your condition may worsen.  A special MedGuide will be given to you by the pharmacist with each prescription and refill. Be sure to read this information carefully each time.  Talk to your pediatrician regarding the use of this medicine in children. Special care may be needed.  Overdosage: If you think you have taken too much of this medicine contact a poison control center or emergency room at once.  NOTE: This medicine is only for you. Do not share this medicine with  others.  What if I miss a dose?  If you miss a dose, skip the missed dose and take your next tablet at the regular time. Do not take double or extra doses.  What may interact with this medicine?  Do not take this medicine with any of the following medications:  -linezolid  -MAOIs like Azilect, Carbex, Eldepryl, Marplan, Nardil, and Parnate  -methylene blue (injected into a vein)  -other medicines that contain bupropion like Zyban  This medicine may also interact with the following medications:  -alcohol  -certain medicines for anxiety or sleep  -certain medicines for blood pressure like metoprolol, propranolol  -certain medicines for depression or psychotic disturbances  -certain medicines for HIV or AIDS like efavirenz, lopinavir, nelfinavir, ritonavir  -certain medicines for irregular heart beat like propafenone, flecainide  -certain medicines for Parkinson's disease like amantadine, levodopa  -certain medicines for seizures like carbamazepine, phenytoin, phenobarbital  -cimetidine  -clopidogrel  -cyclophosphamide  -furazolidone  -isoniazid  -nicotine  -orphenadrine  -procarbazine  -steroid medicines like prednisone or cortisone  -stimulant medicines for attention disorders, weight loss, or to stay awake  -tamoxifen  -theophylline  -thiotepa  -ticlopidine  -tramadol  -warfarin  This list may not describe all possible interactions. Give your health care provider a list of all the medicines, herbs, non-prescription drugs, or dietary supplements you use. Also tell them if you smoke, drink alcohol, or use illegal drugs. Some items may interact with your medicine.  What should I watch for while using this medicine?    Tell your doctor if your symptoms do not get better or if they get worse. Visit your doctor or health care professional for regular checks on your progress. Because it may take several weeks to see the full effects of this medicine, it is important to continue your treatment as prescribed by your  doctor.  Patients and their families should watch out for new or worsening thoughts of suicide or depression. Also watch out for sudden changes in feelings such as feeling anxious, agitated, panicky, irritable, hostile, aggressive, impulsive, severely restless, overly excited and hyperactive, or not being able to sleep. If this happens, especially at the beginning of treatment or after a change in dose, call your health care professional.  Avoid alcoholic drinks while taking this medicine. Drinking large amounts of alcoholic beverages, using sleeping or anxiety medicines, or quickly stopping the use of these agents while taking this medicine may increase your risk for a seizure.  Do not drive or use heavy machinery until you know how this medicine affects you. This medicine can impair your ability to perform these tasks.  Do not take this medicine close to bedtime. It may prevent you from sleeping.  Your mouth may get dry. Chewing sugarless gum or sucking hard candy, and drinking plenty of water may help. Contact your doctor if the problem does not go away or is severe.  The tablet shell for some brands of this medicine does not dissolve. This is normal. The tablet shell may appear whole in the stool. This is not a cause for concern.  What side effects may I notice from receiving this medicine?  Side effects that you should report to your doctor or health care professional as soon as possible:  -allergic reactions like skin rash, itching or hives, swelling of the face, lips, or tongue  -breathing problems  -changes in vision  -confusion  -fast or irregular heartbeat  -hallucinations  -increased blood pressure  -redness, blistering, peeling or loosening of the skin, including inside the mouth  -seizures  -suicidal thoughts or other mood changes  -unusually weak or tired  -vomiting  Side effects that usually do not require medical attention (report to your doctor or health care professional if they continue or are  bothersome):  -change in sex drive or performance  -constipation  -headache  -loss of appetite  -nausea  -tremors  -weight loss  This list may not describe all possible side effects. Call your doctor for medical advice about side effects. You may report side effects to FDA at 1-800-FDA-1088.  Where should I keep my medicine?  Keep out of the reach of children.  Store at room temperature between 15 and 30 degrees C (59 and 86 degrees F). Throw away any unused medicine after the expiration date.  NOTE: This sheet is a summary. It may not cover all possible information. If you have questions about this medicine, talk to your doctor, pharmacist, or health care provider.     © 2016, [employer] Standard. (2013-03-19 12:39:42)

## 2015-07-17 NOTE — Progress Notes (Signed)
Patient ID: Jessica Silva, female    DOB: [birth year]  Age: 29 y.o. MRN: [id number]    Subjective:  Subjective HPI Jessica Silva presents for multiple complaints.  She c/o sob that feels like when she had issues with a possible hiatal hernia--- (she was never dx).  She has been taking omeprazole with no relief.   She also c/o depression She is also c/o a Magazine features editor.    Review of Systems  Constitutional: Negative for diaphoresis, appetite change, fatigue and unexpected weight change.  HENT: Positive for ear discharge and ear pain. Negative for congestion and dental problem.   Eyes: Negative for pain, redness and visual disturbance.  Respiratory: Negative for cough, chest tightness, shortness of breath and wheezing.   Cardiovascular: Negative for chest pain, palpitations and leg swelling.  Endocrine: Negative for cold intolerance, heat intolerance, polydipsia, polyphagia and polyuria.  Genitourinary: Negative for dysuria, frequency and difficulty urinating.  Neurological: Negative for dizziness, light-headedness, numbness and headaches.  Psychiatric/Behavioral: Positive for dysphoric mood. The patient is nervous/anxious.     History No past medical history on file.  She has past surgical history that includes Tonsillectomy.   Her family history includes Hiatal hernia in her maternal grandmother and mother; Hypertension in her sister; Irritable bowel syndrome in her brother and brother.She reports that she has been smoking.  She does not have any smokeless tobacco history on file. She reports that she drinks about 1.2 oz of alcohol per week. She reports that she does not use illicit drugs.  Current Outpatient Prescriptions on File Prior to Visit  Medication Sig Dispense Refill  . ALPRAZolam (XANAX) 0.25 MG tablet Take 1 tablet (0.25 mg total) by mouth 3 (three) times daily as needed for sleep. 60 tablet 0  . EPINEPHrine (EPIPEN) 0.3 mg/0.3 mL DEVI Inject 0.3 mLs (0.3 mg total) into the  muscle as needed. 2 Device 1   No current facility-administered medications on file prior to visit.     Objective:  Objective Physical Exam  HENT:  Right Ear: Hearing, tympanic membrane, external ear and ear canal normal.  Left Ear: Hearing, tympanic membrane, external ear and ear canal normal.  Nose: Nose normal.  Mouth/Throat: Oropharynx is clear and moist. No oropharyngeal exudate or posterior oropharyngeal erythema.  Psychiatric: Her behavior is normal. Thought content normal. Her mood appears not anxious. Her affect is not angry, not blunt, not labile and not inappropriate. She exhibits a depressed mood.  Nursing note and vitals reviewed.  BP 110/80 mmHg  Pulse 98  Temp(Src) 98.9 F (37.2 C) (Oral)  Ht 5' (1.524 m)  Wt 256 lb (116.121 kg)  BMI 50.00 kg/m2  SpO2 98% Wt Readings from Last 3 Encounters:  07/17/15 256 lb (116.121 kg)  11/11/14 269 lb 6.4 oz (122.199 kg)  07/14/13 252 lb (114.306 kg)     Lab Results  Component Value Date   WBC 8.0 11/11/2014   HGB 14.5 11/11/2014   HCT 42.7 11/11/2014   PLT 277 11/11/2014   GLUCOSE 90 11/11/2014   ALT 40* 11/11/2014   AST 26 11/11/2014   NA 140 11/11/2014   K 3.9 11/11/2014   CL 104 11/11/2014   CREATININE 0.74 11/11/2014   BUN 11 11/11/2014   CO2 26 11/11/2014   TSH 2.483 11/11/2014    No results found.   Assessment & Plan:  Plan I have discontinued Jessica Silva's loratadine-pseudoephedrine. I am also having her start on buPROPion, buPROPion, and pantoprazole. Additionally, I am having her  maintain her EPINEPHrine and ALPRAZolam.  Meds ordered this encounter  Medications  . buPROPion (WELLBUTRIN XL) 150 MG 24 hr tablet    Sig: 1 po qd x 1 week then 2 po qd    Dispense:  60 tablet    Refill:  0  . buPROPion (WELLBUTRIN XL) 300 MG 24 hr tablet    Sig: Take 1 tablet (300 mg total) by mouth daily.    Dispense:  90 tablet    Refill:  1  . pantoprazole (PROTONIX) 40 MG tablet    Sig: Take 1 tablet (40 mg  total) by mouth daily.    Dispense:  30 tablet    Refill:  3    Problem List Items Addressed This Visit    Depression    Start wellbutrin F/u 4-6 weekws      Relevant Medications   buPROPion (WELLBUTRIN XL) 150 MG 24 hr tablet   buPROPion (WELLBUTRIN XL) 300 MG 24 hr tablet    Other Visit Diagnoses    Amenorrhea    -  Primary    Relevant Orders    POCT urine pregnancy (Completed)    Dysphagia        Relevant Medications    pantoprazole (PROTONIX) 40 MG tablet    Other Relevant Orders    Comp Met (CMET)    CBC with Differential/Platelet    TSH    H. pylori antibody, IgG       Follow-up: Return in about 6 weeks (around 08/28/2015), or if symptoms worsen or fail to improve, for f/u depression.  Jessica Koyanagi, DO

## 2015-07-18 LAB — TSH: TSH: 2.56 u[IU]/mL (ref 0.35–4.50)

## 2015-07-18 LAB — COMPREHENSIVE METABOLIC PANEL
ALT: 38 U/L — ABNORMAL HIGH (ref 0–35)
AST: 20 U/L (ref 0–37)
Albumin: 4.4 g/dL (ref 3.5–5.2)
Alkaline Phosphatase: 64 U/L (ref 39–117)
BUN: 14 mg/dL (ref 6–23)
CO2: 27 mEq/L (ref 19–32)
Calcium: 9.8 mg/dL (ref 8.4–10.5)
Chloride: 104 mEq/L (ref 96–112)
Creatinine, Ser: 0.71 mg/dL (ref 0.40–1.20)
GFR: 103.72 mL/min (ref >60.00)
Glucose, Bld: 70 mg/dL (ref 70–99)
Potassium: 3.9 mEq/L (ref 3.5–5.1)
Sodium: 141 mEq/L (ref 135–145)
Total Bilirubin: 0.4 mg/dL (ref 0.2–1.2)
Total Protein: 7.4 g/dL (ref 6.0–8.3)

## 2015-07-18 LAB — CBC WITH DIFFERENTIAL/PLATELET
Basophils Absolute: 0.1 10*3/uL (ref 0.0–0.1)
Basophils Relative: 0.8 % (ref 0.0–3.0)
Eosinophils Absolute: 0.2 10*3/uL (ref 0.0–0.7)
Eosinophils Relative: 2.1 % (ref 0.0–5.0)
HCT: 44 % (ref 36.0–46.0)
Hemoglobin: 14.8 g/dL (ref 12.0–15.0)
Lymphocytes Relative: 33.4 % (ref 12.0–46.0)
Lymphs Abs: 3.3 10*3/uL (ref 0.7–4.0)
MCHC: 33.6 g/dL (ref 30.0–36.0)
MCV: 89.3 fl (ref 78.0–100.0)
Monocytes Absolute: 0.5 10*3/uL (ref 0.1–1.0)
Monocytes Relative: 5.2 % (ref 3.0–12.0)
Neutro Abs: 5.7 10*3/uL (ref 1.4–7.7)
Neutrophils Relative %: 58.5 % (ref 43.0–77.0)
Platelets: 289 10*3/uL (ref 150.0–400.0)
RBC: 4.93 Mil/uL (ref 3.87–5.11)
RDW: 11.8 % (ref 11.5–15.5)
WBC: 9.8 10*3/uL (ref 4.0–10.5)

## 2015-07-18 LAB — H. PYLORI ANTIBODY, IGG: H Pylori IgG: NEGATIVE

## 2015-07-20 ENCOUNTER — Encounter: Payer: Self-pay | Admitting: Family Medicine

## 2015-07-24 ENCOUNTER — Encounter: Payer: Self-pay | Admitting: Family Medicine

## 2015-08-08 ENCOUNTER — Encounter: Payer: Self-pay | Admitting: Family Medicine

## 2015-08-08 DIAGNOSIS — F329 Major depressive disorder, single episode, unspecified: Secondary | ICD-10-CM

## 2015-08-08 DIAGNOSIS — F32A Depression, unspecified: Secondary | ICD-10-CM

## 2015-08-09 MED ORDER — BUPROPION HCL ER (XL) 300 MG PO TB24: 300.0000 mg | ORAL_TABLET | Freq: Every day | ORAL | Status: DC

## 2015-08-09 NOTE — Addendum Note (Signed)
Addended by: Marcelline MatesMARTIN, Iridian Reader on: 08/09/2015 12:04 PM   Modules accepted: Orders

## 2015-08-15 ENCOUNTER — Telehealth: Payer: Self-pay

## 2015-08-15 DIAGNOSIS — R1310 Dysphagia, unspecified: Secondary | ICD-10-CM

## 2015-08-15 MED ORDER — PANTOPRAZOLE SODIUM 40 MG PO TBEC: 40.0000 mg | DELAYED_RELEASE_TABLET | Freq: Every day | ORAL | Status: DC

## 2015-08-15 NOTE — Telephone Encounter (Signed)
Pharmacy request sent for 90 day supply on pantoprazole (PROTONIX) 40 MG tablet  90 day supply sent.

## 2015-08-24 ENCOUNTER — Encounter: Payer: Self-pay | Admitting: Family Medicine

## 2015-08-28 ENCOUNTER — Ambulatory Visit: Payer: BLUE CROSS/BLUE SHIELD | Admitting: Family Medicine

## 2015-10-16 ENCOUNTER — Encounter: Payer: Self-pay | Admitting: Family Medicine

## 2015-10-16 NOTE — Telephone Encounter (Signed)
i don't know who they would be calling but I have not gotten any messages

## 2016-01-05 ENCOUNTER — Other Ambulatory Visit: Payer: Self-pay | Admitting: Family Medicine

## 2016-01-05 DIAGNOSIS — R1310 Dysphagia, unspecified: Secondary | ICD-10-CM

## 2016-01-05 DIAGNOSIS — F419 Anxiety disorder, unspecified: Secondary | ICD-10-CM

## 2016-01-05 MED ORDER — PANTOPRAZOLE SODIUM 40 MG PO TBEC: 40.0000 mg | DELAYED_RELEASE_TABLET | Freq: Every day | ORAL | Status: DC

## 2016-01-05 NOTE — Telephone Encounter (Signed)
Needs OV prior to additional refills.  

## 2016-01-05 NOTE — Telephone Encounter (Signed)
Last alprazolam Rx 05/10/15, #30 No UDS on file Last OV:  07/2015 Next OV: past due, Advised f/u 3-4 weeks after last visit. Will need to schedule pt follow up.  Please advise Rx request?

## 2016-02-15 ENCOUNTER — Other Ambulatory Visit: Payer: Self-pay | Admitting: Physician Assistant

## 2016-02-15 NOTE — Telephone Encounter (Signed)
Will defer further refills of patient's medications to PCP  

## 2016-03-18 ENCOUNTER — Ambulatory Visit (INDEPENDENT_AMBULATORY_CARE_PROVIDER_SITE_OTHER): Payer: BLUE CROSS/BLUE SHIELD | Admitting: Family Medicine

## 2016-03-18 VITALS — BP 108/70 | HR 101 | Temp 98.3°F | Ht 60.0 in | Wt 238.0 lb

## 2016-03-18 DIAGNOSIS — R1012 Left upper quadrant pain: Secondary | ICD-10-CM | POA: Diagnosis not present

## 2016-03-18 DIAGNOSIS — E872 Acidosis: Secondary | ICD-10-CM

## 2016-03-18 DIAGNOSIS — R0989 Other specified symptoms and signs involving the circulatory and respiratory systems: Secondary | ICD-10-CM

## 2016-03-18 LAB — CBC WITH DIFFERENTIAL/PLATELET
Basophils Absolute: 0 10*3/uL (ref 0.0–0.1)
Basophils Relative: 0.4 % (ref 0.0–3.0)
Eosinophils Absolute: 0.2 10*3/uL (ref 0.0–0.7)
Eosinophils Relative: 2.7 % (ref 0.0–5.0)
HCT: 42.1 % (ref 36.0–46.0)
Hemoglobin: 14.4 g/dL (ref 12.0–15.0)
Lymphocytes Relative: 34.7 % (ref 12.0–46.0)
Lymphs Abs: 3 10*3/uL (ref 0.7–4.0)
MCHC: 34.1 g/dL (ref 30.0–36.0)
MCV: 88.5 fl (ref 78.0–100.0)
Monocytes Absolute: 0.5 10*3/uL (ref 0.1–1.0)
Monocytes Relative: 6.2 % (ref 3.0–12.0)
Neutro Abs: 4.8 10*3/uL (ref 1.4–7.7)
Neutrophils Relative %: 56 % (ref 43.0–77.0)
Platelets: 298 10*3/uL (ref 150.0–400.0)
RBC: 4.76 Mil/uL (ref 3.87–5.11)
RDW: 11.9 % (ref 11.5–15.5)
WBC: 8.6 10*3/uL (ref 4.0–10.5)

## 2016-03-18 LAB — HEPATIC FUNCTION PANEL
ALT: 19 U/L (ref 0–35)
AST: 13 U/L (ref 0–37)
Albumin: 4.4 g/dL (ref 3.5–5.2)
Alkaline Phosphatase: 71 U/L (ref 39–117)
Bilirubin, Direct: 0.1 mg/dL (ref 0.0–0.3)
Total Bilirubin: 0.4 mg/dL (ref 0.2–1.2)
Total Protein: 6.8 g/dL (ref 6.0–8.3)

## 2016-03-18 LAB — LIPASE: Lipase: 24 U/L (ref 11.0–59.0)

## 2016-03-18 NOTE — Progress Notes (Signed)
Pre visit review using our clinic review tool, if applicable. No additional management support is needed unless otherwise documented below in the visit note. 

## 2016-03-18 NOTE — Progress Notes (Signed)
   Subjective:    Patient ID: Jessica Silva, female    DOB: May 04, 1986, 30 y.o.   MRN: [id number]  HPI Acute visit for left upper quadrant abdominal pain. Onset about 1-2 weeks ago. Pain is somewhat intermittent. 5 out of 10 intensity at it's worst. Pain seems to be worse with sitting. She sometimes feels "full" especially after eating. Denies any vomiting. Minimal inermittent nausea. No change in appetite. No alleviating factors. Denies associated fever, chills, cough.   She thinks she may have had some slightly looser stools over past couple days otherwise no change in bowel habits. No known injury. She has done some reading online and was concerned about possible pancreatitis though she has not had any vomiting and no epigastric pain.  Patient has also had recent sensation of air hunger with sitting at rest. No dyspnea whatsoever with exertion. No chest pains. No pleuritic pain. Denies any new stressors.  No past medical history on file. Past Surgical History  Procedure Laterality Date  . Tonsillectomy      reports that she has been smoking.  She does not have any smokeless tobacco history on file. She reports that she drinks about 1.2 oz of alcohol per week. She reports that she does not use illicit drugs. family history includes Hiatal hernia in her maternal grandmother and mother; Hypertension in her sister; Irritable bowel syndrome in her brother and brother. Allergies  Allergen Reactions  . Penicillins Hives       Review of Systems  Constitutional: Negative for fever, chills, appetite change and unexpected weight change.  Respiratory: Negative for cough and shortness of breath.   Cardiovascular: Negative for chest pain.  Gastrointestinal: Positive for abdominal pain. Negative for vomiting, diarrhea, constipation, blood in stool and abdominal distention.  Genitourinary: Negative for dysuria.  Neurological: Negative for weakness.       Objective:   Physical Exam    Constitutional: She appears well-developed and well-nourished. No distress.  Neck: Neck supple. No thyromegaly present.  Cardiovascular: Normal rate and regular rhythm.   Pulmonary/Chest: Effort normal and breath sounds normal. No respiratory distress. She has no wheezes. She has no rales.  Abdominal: Soft. Bowel sounds are normal. She exhibits no distension and no mass. There is no tenderness. There is no rebound and no guarding.  Musculoskeletal: She exhibits no edema.  Skin: No rash noted.          Assessment & Plan:  Abdominal pain left upper quadrant. Symptoms have been very intermittent and relatively mild. Not reproducible on exam today. Etiology unclear. Check labs- CBC, lipase, hepatic panel. Follow-up promptly for any fever, progressive pain, or new symptoms  She is describing some intermittent air hunger which occurs only at rest and never with exertion. This sounds to be mostly anxiety related.  Pulse oximetry 99%. We discussed trying to re-direct her focus when noting those symptoms   Kristian CoveyBruce W Kamora Vossler MD [facility]

## 2016-03-18 NOTE — Patient Instructions (Signed)

## 2016-03-19 ENCOUNTER — Encounter: Payer: Self-pay | Admitting: Family Medicine

## 2016-04-11 ENCOUNTER — Ambulatory Visit (INDEPENDENT_AMBULATORY_CARE_PROVIDER_SITE_OTHER): Payer: BLUE CROSS/BLUE SHIELD | Admitting: Obstetrics and Gynecology

## 2016-04-11 ENCOUNTER — Encounter: Payer: Self-pay | Admitting: Obstetrics and Gynecology

## 2016-04-11 VITALS — BP 122/80 | HR 72 | Resp 12 | Ht 60.5 in | Wt 235.4 lb

## 2016-04-11 DIAGNOSIS — Z124 Encounter for screening for malignant neoplasm of cervix: Secondary | ICD-10-CM | POA: Diagnosis not present

## 2016-04-11 DIAGNOSIS — N926 Irregular menstruation, unspecified: Secondary | ICD-10-CM

## 2016-04-11 DIAGNOSIS — Z0000 Encounter for general adult medical examination without abnormal findings: Secondary | ICD-10-CM

## 2016-04-11 DIAGNOSIS — Z8742 Personal history of other diseases of the female genital tract: Secondary | ICD-10-CM | POA: Diagnosis not present

## 2016-04-11 DIAGNOSIS — Z01419 Encounter for gynecological examination (general) (routine) without abnormal findings: Secondary | ICD-10-CM

## 2016-04-11 DIAGNOSIS — E663 Overweight: Secondary | ICD-10-CM

## 2016-04-11 LAB — LIPID PANEL
Cholesterol: 171 mg/dL (ref 125–200)
HDL: 50 mg/dL (ref >=46)
LDL Cholesterol: 98 mg/dL (ref <130)
Total CHOL/HDL Ratio: 3.4 Ratio (ref <=5.0)
Triglycerides: 117 mg/dL (ref <150)
VLDL: 23 mg/dL (ref <30)

## 2016-04-11 LAB — TSH: TSH: 2 mIU/L

## 2016-04-11 LAB — POCT URINE PREGNANCY: Preg Test, Ur: NEGATIVE

## 2016-04-11 NOTE — Progress Notes (Signed)
30 y.o. G0P0000 MarriedCaucasianF here for annual exam.  Patient wants to discuss Premature Ovarian Failure dx. She was told she had POF as a teen, she had an elevated FSH and ultrasound with minimal follicles.  She was never put on medication. She has been on OCP's for short periods in the past. Didn't like them.  She had one cycle at the age of 11-12, she had 2 other random cycles and then started having monthly cycles in the last year. Cycles started after she started Wellbutrin around 11/16. She has lost about 50 lbs in the last year.  Cycles coming monthly. 2 x in the last 6 months she has bleed over 10 days. Not sure if she has had bleeding between her cycles.  No acne or abnormal hair growth. No h/o hot flashes, night sweats or vaginal dryness at any time.  In high school she did bleed after a course of provera, then went on Yaz for a few months.  She has been sexually active with her husband for 3-4 years, used condoms until a little over a year ago. Wants to get pregnant. Not actively trying, not preventing.  Period Cycle (Days): 30 Period Duration (Days): 4-10 Period Pattern: Regular Menstrual Flow: Light Menstrual Control: Maxi pad Menstrual Control Change Freq (Hours): 2-3 times a day Dysmenorrhea: (!) Mild Dysmenorrhea Symptoms: Cramping  Patient's last menstrual period was 03/13/2016.          Sexually active: Yes.    The current method of family planning is none.    Exercising: Yes.    walking Smoker:  no  Health Maintenance: Pap:  2013 WNL per patient History of abnormal Pap:  no MMG:  n/a Colonoscopy:  n/a BMD:   n/a TDaP:  Unsure, thinks UTD Gardasil: No.    reports that she has quit smoking. She has a 0.10 pack-year smoking history. She has never used smokeless tobacco. She reports that she drinks alcohol. She reports that she does not use drugs.rare ETOH. She is a substance abuse counselor.   Past Medical History:  Diagnosis Date  . Abnormal uterine bleeding    . Amenorrhea   . Anxiety   . Depression   . Hormone disorder   . Infertility, female   . Premature ovarian failure 2002  . Premature ovarian failure 2002    Past Surgical History:  Procedure Laterality Date  . TONSILLECTOMY    . WISDOM TOOTH EXTRACTION      Current Outpatient Prescriptions  Medication Sig Dispense Refill  . buPROPion (WELLBUTRIN XL) 300 MG 24 hr tablet TAKE 1 TABLET (300 MG TOTAL) BY MOUTH DAILY. 90 tablet 0  . EPINEPHrine (EPIPEN) 0.3 mg/0.3 mL DEVI Inject 0.3 mLs (0.3 mg total) into the muscle as needed. 2 Device 1   No current facility-administered medications for this visit.   Allergy to bee stings  Family History  Problem Relation Age of Onset  . Hiatal hernia Mother   . Hypertension Sister   . Polycystic ovary syndrome Sister   . Irritable bowel syndrome Brother   . Hiatal hernia Maternal Grandmother   . Irritable bowel syndrome Brother     Review of Systems  Constitutional: Negative.   HENT: Negative.   Eyes: Negative.   Respiratory: Negative.   Gastrointestinal: Negative.   Endocrine: Negative.   Genitourinary: Negative.   Musculoskeletal: Negative.   Skin: Negative.   Allergic/Immunologic: Negative.   Neurological: Negative.   Hematological: Negative.   Psychiatric/Behavioral: Negative.     Exam:  BP 122/80 (BP Location: Right Arm, Patient Position: Sitting, Cuff Size: Large)   Pulse 72   Resp 12   Ht 5' 0.5" (1.537 m)   Wt 235 lb 6.4 oz (106.8 kg)   LMP 03/13/2016   BMI 45.22 kg/m   Weight change: @WEIGHTCHANGE @ Height:   Height: 5' 0.5" (153.7 cm)  Ht Readings from Last 3 Encounters:  04/11/16 5' 0.5" (1.537 m)  03/18/16 5' (1.524 m)  07/17/15 5' (1.524 m)    General appearance: alert, cooperative and appears stated age Head: Normocephalic, without obvious abnormality, atraumatic Neck: no adenopathy, supple, symmetrical, trachea midline and thyroid normal to inspection and palpation Lungs: clear to auscultation  bilaterally Breasts: normal appearance, no masses or tenderness Heart: regular rate and rhythm Abdomen: soft, non-tender; bowel sounds normal; no masses,  no organomegaly Extremities: extremities normal, atraumatic, no cyanosis or edema Skin: Skin color, texture, turgor normal. No rashes or lesions Lymph nodes: Cervical, supraclavicular, and axillary nodes normal. No abnormal inguinal nodes palpated Neurologic: Grossly normal   Pelvic: External genitalia:  no lesions              Urethra:  normal appearing urethra with no masses, tenderness or lesions              Bartholins and Skenes: normal                 Vagina: normal appearing vagina with normal color and discharge, no lesions              Cervix: no lesions               Bimanual Exam:  Uterus:  normal size, contour, position, consistency, mobility, non-tender              Adnexa: no mass, fullness, tenderness               Rectovaginal: Confirms               Anus:  normal sphincter tone, no lesions  Chaperone was present for exam.  A:  Well Woman with normal exam   Irregular cycles, after years of amenorrhea  H/O "premature ovarian failure", diagnosis seems unlikely. She had a w/d to provera in her teens. She has never  had a DEXA, no symptoms of menopause, no long term hormonal tretment  No pregnancy with over a year of unprotected intercourse, infertility. Suspect anovulation  Overweight, working on weight loss   P:   UPT negative  Start Prenatal vitamins  TSH, prolactin, FSH, estradiol and AMH  Lipids   Other labs with her primary (normal glucose in the last year)  Calendar all bleeding  BBT charts given  Discussed getting an ultrasound to look at her lining and her ovaries  If she doesn't get an ultrasound now, may need to schedule after further review of her cycles in 3 months

## 2016-04-11 NOTE — Patient Instructions (Signed)

## 2016-04-12 ENCOUNTER — Telehealth: Payer: Self-pay | Admitting: Obstetrics and Gynecology

## 2016-04-12 ENCOUNTER — Encounter: Payer: Self-pay | Admitting: Obstetrics and Gynecology

## 2016-04-12 ENCOUNTER — Other Ambulatory Visit: Payer: Self-pay | Admitting: Obstetrics and Gynecology

## 2016-04-12 DIAGNOSIS — E2839 Other primary ovarian failure: Secondary | ICD-10-CM

## 2016-04-12 DIAGNOSIS — N912 Amenorrhea, unspecified: Secondary | ICD-10-CM

## 2016-04-12 DIAGNOSIS — E288 Other ovarian dysfunction: Principal | ICD-10-CM

## 2016-04-12 LAB — PROLACTIN: Prolactin: 3.7 ng/mL

## 2016-04-12 LAB — FOLLICLE STIMULATING HORMONE: FSH: 45.7 m[IU]/mL

## 2016-04-12 LAB — ESTRADIOL: Estradiol: 27 pg/mL

## 2016-04-12 LAB — IPS PAP TEST WITH REFLEX TO HPV

## 2016-04-12 NOTE — Telephone Encounter (Signed)
I spoke with the patient, recommended she come in for further blood work and an ultrasound. She will also need a DEXA. I will discuss this with her at her next appointment.  I also recommended she get in to see an Infertility specialist, I will set this up for her after completing her w/u.

## 2016-04-15 LAB — ANTI MULLERIAN HORMONE: AMH AssessR: <0.03 ng/mL

## 2016-04-15 NOTE — Addendum Note (Signed)
Addended by: Joeseph AmorFAST, Bryn Perkin L on: 04/15/2016 12:27 PM   Modules accepted: Orders

## 2016-04-16 ENCOUNTER — Other Ambulatory Visit: Payer: Self-pay | Admitting: Obstetrics and Gynecology

## 2016-04-16 ENCOUNTER — Ambulatory Visit (INDEPENDENT_AMBULATORY_CARE_PROVIDER_SITE_OTHER): Payer: BLUE CROSS/BLUE SHIELD | Admitting: Obstetrics and Gynecology

## 2016-04-16 ENCOUNTER — Ambulatory Visit (INDEPENDENT_AMBULATORY_CARE_PROVIDER_SITE_OTHER): Payer: BLUE CROSS/BLUE SHIELD

## 2016-04-16 ENCOUNTER — Encounter: Payer: Self-pay | Admitting: Obstetrics and Gynecology

## 2016-04-16 VITALS — BP 110/80 | HR 100 | Ht 60.5 in | Wt 231.0 lb

## 2016-04-16 DIAGNOSIS — E2839 Other primary ovarian failure: Secondary | ICD-10-CM

## 2016-04-16 DIAGNOSIS — N939 Abnormal uterine and vaginal bleeding, unspecified: Secondary | ICD-10-CM | POA: Diagnosis not present

## 2016-04-16 DIAGNOSIS — N912 Amenorrhea, unspecified: Secondary | ICD-10-CM

## 2016-04-16 DIAGNOSIS — E288 Other ovarian dysfunction: Secondary | ICD-10-CM

## 2016-04-16 DIAGNOSIS — N979 Female infertility, unspecified: Secondary | ICD-10-CM

## 2016-04-16 LAB — T4, FREE: Free T4: 1.3 ng/dL (ref 0.8–1.8)

## 2016-04-16 NOTE — Progress Notes (Signed)
    ADDENDUM ABOVE

## 2016-04-16 NOTE — Progress Notes (Signed)
GYNECOLOGY  VISIT   HPI: 30 y.o.   Married  Caucasian  female   G0P0000 with Patient's last menstrual period was 03/13/2016.   here for   Ultrasound for h/o POF and recent onset of monthly bleeding (not calendaring). Her FSH, estradiol and AMH are all in a postmenopausal range.   GYNECOLOGIC HISTORY: Patient's last menstrual period was 03/13/2016. Contraception: None Menopausal hormone therapy: None        OB History    Gravida Para Term Preterm AB Living   0 0 0 0 0 0   SAB TAB Ectopic Multiple Live Births   0 0 0 0 0         Patient Active Problem List   Diagnosis Date Noted  . Depression 07/17/2015  . Weight gain 07/14/2013    Past Medical History:  Diagnosis Date  . Abnormal uterine bleeding   . Amenorrhea   . Anxiety   . Depression   . Hormone disorder   . Infertility, female   . Premature ovarian failure 2002  . Premature ovarian failure 2002    Past Surgical History:  Procedure Laterality Date  . TONSILLECTOMY    . WISDOM TOOTH EXTRACTION      Current Outpatient Prescriptions  Medication Sig Dispense Refill  . buPROPion (WELLBUTRIN XL) 300 MG 24 hr tablet TAKE 1 TABLET (300 MG TOTAL) BY MOUTH DAILY. 90 tablet 0  . EPINEPHrine (EPIPEN) 0.3 mg/0.3 mL DEVI Inject 0.3 mLs (0.3 mg total) into the muscle as needed. (Patient not taking: Reported on 04/16/2016) 2 Device 1   No current facility-administered medications for this visit.      ALLERGIES: Penicillins  Family History  Problem Relation Age of Onset  . Hiatal hernia Mother   . Hypertension Sister   . Polycystic ovary syndrome Sister   . Irritable bowel syndrome Brother   . Hiatal hernia Maternal Grandmother   . Irritable bowel syndrome Brother     Social History   Social History  . Marital status: Married    Spouse name: N/A  . Number of children: N/A  . Years of education: N/A   Occupational History  . [employer]   Social History Main Topics  . Smoking status:  Former Smoker    Packs/day: 0.10    Years: 1.00  . Smokeless tobacco: Never Used     Comment: has not smoked last 2 days  . Alcohol use 0.0 - 0.6 oz/week     Comment: occasional  . Drug use: No  . Sexual activity: Yes    Partners: Male    Birth control/ protection: None     Comment: has premature ovarian failure   Other Topics Concern  . Not on file   Social History Narrative  . No narrative on file    ROS  PHYSICAL EXAMINATION:    BP 110/80 (BP Location: Left Arm, Patient Position: Sitting, Cuff Size: Large)   Pulse 100   Ht 5' 0.5" (1.537 m)   Wt 231 lb (104.8 kg)   LMP 03/13/2016   BMI 44.37 kg/m     General appearance: alert, cooperative and appears stated age   Pelvic: External genitalia:  no lesions              Urethra:  normal appearing urethra with no masses, tenderness or lesions              Bartholins and Skenes: normal  Vagina: normal appearing vagina with normal color and discharge, no lesions              Cervix: no lesions   UPT: negative            Sonohysterogram The procedure and risks of the procedure were reviewed with the patient, consent form was signed. A speculum was placed in the vagina and the cervix was cleansed with betadine. The sonohysterogram catheter was inserted into the uterine cavity without difficulty. Saline was infused under direct observation with the ultrasound. A large endometrial polyp was noted. The catheter was removed.     Chaperone was present for exam.  ASSESSMENT Premature ovarian failure Irregular vaginal bleeding, endometrial polyp seen on ultrasound Desire for fertility    PLAN Lab work today to check for autoimmune disorders, karyotype, fragile X Recommend hysteroscopy, polypectomy, dilation and curettage Will then send her for evaluation with infertility, will likely need donor egg Reviewed risks of bleeding, infection, uterine perforation, fluid overload     An After Visit Summary was  printed and given to the patient.  20 minutes face to face time of which over 50% was spent in counseling.   Addendum: Patient will need a DEXA, called to tell her this, no answer and mailbox not set up. Will call again

## 2016-04-17 LAB — THYROGLOBULIN ANTIBODY PANEL
Thyroglobulin Ab: 1 IU/mL (ref <2)
Thyroglobulin: 4.5 ng/mL (ref 2.8–40.9)
Thyroperoxidase Ab SerPl-aCnc: 26 IU/mL — ABNORMAL HIGH (ref <9)

## 2016-04-18 ENCOUNTER — Telehealth: Payer: Self-pay | Admitting: *Deleted

## 2016-04-18 ENCOUNTER — Encounter: Payer: Self-pay | Admitting: Obstetrics and Gynecology

## 2016-04-18 NOTE — Telephone Encounter (Signed)
Call to patient. Confirmed [facility] date [facility]. Surgery instructions reviewed with patient and printed instruction sheet mailed. See copy scanned to chart.   Routing to provider for final review. Patient agreeable to disposition. Will close encounter.

## 2016-04-18 NOTE — Telephone Encounter (Signed)
Spoke with Harland DingwallSuzy Dixon regarding patient's [employer] Group[employer] message. Thomasene LotSuzy is aware the patient can be reached today before 2:30 pm and will be contacting her today.

## 2016-04-22 ENCOUNTER — Encounter: Payer: Self-pay | Admitting: Obstetrics and Gynecology

## 2016-04-23 ENCOUNTER — Telehealth: Payer: Self-pay

## 2016-04-23 LAB — FRAGILE X WITH REFLEX PCR

## 2016-04-23 NOTE — Telephone Encounter (Signed)
Routing to Dr.Jertson for review and advise.  Visit Follow-Up Question  Message [phone]  From [place] BeachJena R Sagun To Romualdo BolkJill Evelyn Jertson, MD Sent 04/22/2016 12:08 PM  Hey Dr Oscar LaJertson,  I don't know if we are waiting on other test results to come back, but I don't remember if we talked about what was actually seen on the ultrasound aside from the polyp. [facility] and it looks pretty dismal, but I was wondering if there were any eggs or follicles on the ultrasound from what you could see.   Responsible Party   Pool - [facility] Clinical Pool No one has taken responsibility for this message.  No actions have been taken on this message.

## 2016-04-23 NOTE — Telephone Encounter (Signed)
Phone message created for encounter.   Will close encounter.

## 2016-04-23 NOTE — Telephone Encounter (Signed)
I spoke with the patient. Discussed her ultrasound visualization of her ovaries without follicles. Discussed that most her labs are still pending. 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  Once the rest of her labs return, I will set her up to see the Infertility specialist and an Endocrinologist.

## 2016-04-25 ENCOUNTER — Encounter: Payer: Self-pay | Admitting: Obstetrics and Gynecology

## 2016-04-25 LAB — CHROMOSOME ANALYSIS, PERIPHERAL BLOOD

## 2016-04-25 LAB — 21-HYDROXYLASE ANTIBODIES: 21-Hydroxylase Antibody: NEGATIVE

## 2016-04-26 LAB — ADRENAL ANTIBODY SCREEN RFLX TITER: Adrenal Ab: NEGATIVE

## 2016-04-26 NOTE — Telephone Encounter (Signed)
Additional My Chart response from patient:  Just as a follow up to the test results.Jessica Silva.Jessica Silva.I'm so glad to see there aren't chromosomal abnormalities. I'm interested to see what the endo will say about the thyroid antibodies. I've been progressively more fatigued with a ton of brain fog over the past year or two. 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 Hopefully I can find some answers !

## 2016-04-27 ENCOUNTER — Telehealth: Payer: Self-pay | Admitting: Obstetrics and Gynecology

## 2016-04-27 DIAGNOSIS — E288 Other ovarian dysfunction: Principal | ICD-10-CM

## 2016-04-27 DIAGNOSIS — E2839 Other primary ovarian failure: Secondary | ICD-10-CM

## 2016-04-27 NOTE — Telephone Encounter (Signed)
Please contact the patient and tell her I recommend she have a bone density secondary to her history of premature ovarian failure. This increases her risk of bone loss and early osteoporosis.  Please set up the dexa.  Thanks

## 2016-04-27 NOTE — H&P (Addendum)
GYNECOLOGY  VISIT   HPI: 30 y.o.   Married  Caucasian  female   G0P0000 with Patient's last menstrual period was 03/13/2016.   here for   Ultrasound for h/o POF and recent onset of monthly bleeding (not calendaring). Her FSH, estradiol and AMH are all in a postmenopausal range.   GYNECOLOGIC HISTORY: Patient's last menstrual period was 03/13/2016. Contraception: None Menopausal hormone therapy: None                OB History    Gravida Para Term Preterm AB Living   0 0 0 0 0 0   SAB TAB Ectopic Multiple Live Births   0 0 0 0 0             Patient Active Problem List   Diagnosis Date Noted  . Depression 07/17/2015  . Weight gain 07/14/2013        Past Medical History:  Diagnosis Date  . Abnormal uterine bleeding   . Amenorrhea   . Anxiety   . Depression   . Hormone disorder   . Infertility, female   . Premature ovarian failure 2002  . Premature ovarian failure 2002         Past Surgical History:  Procedure Laterality Date  . TONSILLECTOMY    . WISDOM TOOTH EXTRACTION            Current Outpatient Prescriptions  Medication Sig Dispense Refill  . buPROPion (WELLBUTRIN XL) 300 MG 24 hr tablet TAKE 1 TABLET (300 MG TOTAL) BY MOUTH DAILY. 90 tablet 0  . EPINEPHrine (EPIPEN) 0.3 mg/0.3 mL DEVI Inject 0.3 mLs (0.3 mg total) into the muscle as needed. (Patient not taking: Reported on 04/16/2016) 2 Device 1   No current facility-administered medications for this visit.      ALLERGIES: Penicillins       Family History  Problem Relation Age of Onset  . Hiatal hernia Mother   . Hypertension Sister   . Polycystic ovary syndrome Sister   . Irritable bowel syndrome Brother   . Hiatal hernia Maternal Grandmother   . Irritable bowel syndrome Brother     Social History        Social History  . Marital status: Married    Spouse name: N/A  . Number of children: N/A  . Years of education: N/A         Occupational History  . [employer]         Social History Main Topics  . Smoking status: Former Smoker    Packs/day: 0.10    Years: 1.00  . Smokeless tobacco: Never Used     Comment: has not smoked last 2 days  . Alcohol use 0.0 - 0.6 oz/week     Comment: occasional  . Drug use: No  . Sexual activity: Yes    Partners: Male    Birth control/ protection: None     Comment: has premature ovarian failure   Other Topics Concern  . Not on file      Social History Narrative  . No narrative on file    ROS  PHYSICAL EXAMINATION:    BP 110/80 (BP Location: Left Arm, Patient Position: Sitting, Cuff Size: Large)   Pulse 100   Ht 5' 0.5" (1.537 m)   Wt 231 lb (104.8 kg)   LMP 03/13/2016   BMI 44.37 kg/m     General appearance: alert, cooperative and appears stated age   Pelvic:  External genitalia:  no lesions              Urethra:  normal appearing urethra with no masses, tenderness or lesions              Bartholins and Skenes: normal                 Vagina: normal appearing vagina with normal color and discharge, no lesions              Cervix: no lesions   UPT: negative            Sonohysterogram The procedure and risks of the procedure were reviewed with the patient, consent form was signed. A speculum was placed in the vagina and the cervix was cleansed with betadine. The sonohysterogram catheter was inserted into the uterine cavity without difficulty. Saline was infused under direct observation with the ultrasound. A large endometrial polyp was noted. The catheter was removed.     Chaperone was present for exam.  ASSESSMENT Premature ovarian failure Irregular vaginal bleeding, endometrial polyp seen on ultrasound Desire for fertility    PLAN Lab work today to check for autoimmune disorders, karyotype, fragile X Recommend hysteroscopy, polypectomy, dilation and curettage Will then send her for evaluation with  infertility, will likely need donor egg Reviewed risks of bleeding, infection, uterine perforation, fluid overload     An After Visit Summary was printed and given to the patient.  20 minutes face to face time of which over 50% was spent in counseling.   Addendum: Patient will need a DEXA, called to tell her this, no answer and mailbox not set up. Will call again          GYNECOLOGY  VISIT   HPI: 30 y.o.   Married  Caucasian  female   G0P0000 with Patient's last menstrual period was 03/13/2016.   here for   Ultrasound for h/o POF and recent onset of monthly bleeding (not calendaring). Her FSH, estradiol and AMH are all in a postmenopausal range.   GYNECOLOGIC HISTORY: Patient's last menstrual period was 03/13/2016. Contraception: None Menopausal hormone therapy: None                OB History    Gravida Para Term Preterm AB Living   0 0 0 0 0 0   SAB TAB Ectopic Multiple Live Births   0 0 0 0 0             Patient Active Problem List   Diagnosis Date Noted  . Depression 07/17/2015  . Weight gain 07/14/2013        Past Medical History:  Diagnosis Date  . Abnormal uterine bleeding   . Amenorrhea   . Anxiety   . Depression   . Hormone disorder   . Infertility, female   . Premature ovarian failure 2002  . Premature ovarian failure 2002         Past Surgical History:  Procedure Laterality Date  . TONSILLECTOMY    . WISDOM TOOTH EXTRACTION            Current Outpatient Prescriptions  Medication Sig Dispense Refill  . buPROPion (WELLBUTRIN XL) 300 MG 24 hr tablet TAKE 1 TABLET (300 MG TOTAL) BY MOUTH DAILY. 90 tablet 0  . EPINEPHrine (EPIPEN) 0.3 mg/0.3 mL DEVI Inject 0.3 mLs (0.3 mg total) into the muscle as needed. (Patient not taking: Reported on 04/16/2016) 2 Device 1   No current facility-administered medications for this  visit.      ALLERGIES: Penicillins       Family History  Problem Relation Age of Onset    . Hiatal hernia Mother   . Hypertension Sister   . Polycystic ovary syndrome Sister   . Irritable bowel syndrome Brother   . Hiatal hernia Maternal Grandmother   . Irritable bowel syndrome Brother     Social History        Social History  . Marital status: Married    Spouse name: N/A  . Number of children: N/A  . Years of education: N/A       Occupational History  . [employer]         Social History Main Topics  . Smoking status: Former Smoker    Packs/day: 0.10    Years: 1.00  . Smokeless tobacco: Never Used     Comment: has not smoked last 2 days  . Alcohol use 0.0 - 0.6 oz/week     Comment: occasional  . Drug use: No  . Sexual activity: Yes    Partners: Male    Birth control/ protection: None     Comment: has premature ovarian failure   Other Topics Concern  . Not on file      Social History Narrative  . No narrative on file    ROS  PHYSICAL EXAMINATION:    BP 110/80 (BP Location: Left Arm, Patient Position: Sitting, Cuff Size: Large)   Pulse 100   Ht 5' 0.5" (1.537 m)   Wt 231 lb (104.8 kg)   LMP 03/13/2016   BMI 44.37 kg/m     General appearance: alert, cooperative and appears stated age   Pelvic: External genitalia:  no lesions              Urethra:  normal appearing urethra with no masses, tenderness or lesions              Bartholins and Skenes: normal                 Vagina: normal appearing vagina with normal color and discharge, no lesions              Cervix: no lesions   UPT: negative            Sonohysterogram The procedure and risks of the procedure were reviewed with the patient, consent form was signed. A speculum was placed in the vagina and the cervix was cleansed with betadine. The sonohysterogram catheter was inserted into the uterine cavity without difficulty. Saline was infused under direct observation with the ultrasound. A large endometrial polyp was noted. The catheter  was removed.     Chaperone was present for exam.  ASSESSMENT Premature ovarian failure Irregular vaginal bleeding, endometrial polyp seen on ultrasound Desire for fertility    PLAN Lab work today to check for autoimmune disorders, karyotype, fragile X Recommend hysteroscopy, polypectomy, dilation and curettage Will then send her for evaluation with infertility, will likely need donor egg Reviewed risks of bleeding, infection, uterine perforation, fluid overload     An After Visit Summary was printed and given to the patient.  20 minutes face to face time of which over 50% was spent in counseling.   Addendum: Patient will need a DEXA, called to tell her this, no answer and mailbox not set up. Will call again          GYNECOLOGY  VISIT   HPI: 30 y.o.   Married  Caucasian  female   G0P0000 with Patient's last menstrual period was 03/13/2016.   here for   Ultrasound for h/o POF and recent onset of monthly bleeding (not calendaring). Her FSH, estradiol and AMH are all in a postmenopausal range.   GYNECOLOGIC HISTORY: Patient's last menstrual period was 03/13/2016. Contraception: None Menopausal hormone therapy: None                OB History    Gravida Para Term Preterm AB Living   0 0 0 0 0 0   SAB TAB Ectopic Multiple Live Births   0 0 0 0 0             Patient Active Problem List   Diagnosis Date Noted  . Depression 07/17/2015  . Weight gain 07/14/2013        Past Medical History:  Diagnosis Date  . Abnormal uterine bleeding   . Amenorrhea   . Anxiety   . Depression   . Hormone disorder   . Infertility, female   . Premature ovarian failure 2002  . Premature ovarian failure 2002         Past Surgical History:  Procedure Laterality Date  . TONSILLECTOMY    . WISDOM TOOTH EXTRACTION            Current Outpatient Prescriptions  Medication Sig Dispense Refill  . buPROPion (WELLBUTRIN XL) 300 MG 24 hr  tablet TAKE 1 TABLET (300 MG TOTAL) BY MOUTH DAILY. 90 tablet 0  . EPINEPHrine (EPIPEN) 0.3 mg/0.3 mL DEVI Inject 0.3 mLs (0.3 mg total) into the muscle as needed. (Patient not taking: Reported on 04/16/2016) 2 Device 1   No current facility-administered medications for this visit.      ALLERGIES: Penicillins       Family History  Problem Relation Age of Onset  . Hiatal hernia Mother   . Hypertension Sister   . Polycystic ovary syndrome Sister   . Irritable bowel syndrome Brother   . Hiatal hernia Maternal Grandmother   . Irritable bowel syndrome Brother     Social History        Social History  . Marital status: Married    Spouse name: N/A  . Number of children: N/A  . Years of education: N/A       Occupational History  . [employer]         Social History Main Topics  . Smoking status: Former Smoker    Packs/day: 0.10    Years: 1.00  . Smokeless tobacco: Never Used     Comment: has not smoked last 2 days  . Alcohol use 0.0 - 0.6 oz/week     Comment: occasional  . Drug use: No  . Sexual activity: Yes    Partners: Male    Birth control/ protection: None     Comment: has premature ovarian failure   Other Topics Concern  . Not on file      Social History Narrative  . No narrative on file    ROS  PHYSICAL EXAMINATION:    BP 110/80 (BP Location: Left Arm, Patient Position: Sitting, Cuff Size: Large)   Pulse 100   Ht 5' 0.5" (1.537 m)   Wt 231 lb (104.8 kg)   LMP 03/13/2016   BMI 44.37 kg/m     General appearance: alert, cooperative and appears stated age   Pelvic: External genitalia:  no lesions  Urethra:  normal appearing urethra with no masses, tenderness or lesions              Bartholins and Skenes: normal                 Vagina: normal appearing vagina with normal color and discharge, no lesions              Cervix: no lesions   UPT: negative            Sonohysterogram The  procedure and risks of the procedure were reviewed with the patient, consent form was signed. A speculum was placed in the vagina and the cervix was cleansed with betadine. The sonohysterogram catheter was inserted into the uterine cavity without difficulty. Saline was infused under direct observation with the ultrasound. A large endometrial polyp was noted. The catheter was removed.     Chaperone was present for exam.  ASSESSMENT Premature ovarian failure Irregular vaginal bleeding, endometrial polyp seen on ultrasound Desire for fertility    PLAN Lab work today to check for autoimmune disorders, karyotype, fragile X Recommend hysteroscopy, polypectomy, dilation and curettage Will then send her for evaluation with infertility, will likely need donor egg Reviewed risks of bleeding, infection, uterine perforation, fluid overload     An After Visit Summary was printed and given to the patient.  20 minutes face to face time of which over 50% was spent in counseling.   Addendum: Patient will need a DEXA, called to tell her this, no answer and mailbox not set up. Will call again      Addendum: Full exam from 04/11/16 General appearance: alert, cooperative and appears stated age Head: Normocephalic, without obvious abnormality, atraumatic Neck: no adenopathy, supple, symmetrical, trachea midline and thyroid normal to inspection and palpation Lungs: clear to auscultation bilaterally Breasts: normal appearance, no masses or tenderness Heart: regular rate and rhythm Abdomen: soft, non-tender; bowel sounds normal; no masses,  no organomegaly Extremities: extremities normal, atraumatic, no cyanosis or edema Skin: Skin color, texture, turgor normal. No rashes or lesions Lymph nodes: Cervical, supraclavicular, and axillary nodes normal. No abnormal inguinal nodes palpated Neurologic: Grossly normal

## 2016-04-29 NOTE — Telephone Encounter (Signed)
Spoke with patient. Advised of message as seen below from Dr.Jertson. She is agreeable. Reports her next day off following surgery with Dr.Jertson is 05/13/2016 requesting to schedule BMD on this day. Advised that is labor day and imaging location will not be open. Reports she will have to check her schedule and return call to schedule at a later date.  Routing to provider for final review. Patient agreeable to disposition. Will close encounter.

## 2016-04-30 LAB — FRAGILE X REFLEX

## 2016-04-30 NOTE — Patient Instructions (Signed)
Your procedure is scheduled on:  Tuesday, May 07, 2016  Enter through the Main Entrance of [facility] at:  6:00 AM  Pick up the phone at the desk and dial [phone].  Call this number if you have problems the morning of surgery: [phone].  Remember: Do NOT eat food or drink after:  Midnight Monday  Take these medicines the morning of surgery with a SIP OF WATER:  Wellbutrin  Do NOT wear jewelry (body piercing), metal hair clips/bobby pins, make-up, or nail polish. Do NOT wear lotions, powders, or perfumes.  You may wear deodorant. Do NOT shave for 48 hours prior to surgery. Do NOT bring valuables to the hospital. Contacts, dentures, or bridgework may not be worn into surgery.  Have a responsible adult drive you home and stay with you for 24 hours after your procedure

## 2016-05-01 ENCOUNTER — Other Ambulatory Visit: Payer: Self-pay | Admitting: *Deleted

## 2016-05-01 ENCOUNTER — Encounter (HOSPITAL_COMMUNITY): Payer: Self-pay

## 2016-05-01 ENCOUNTER — Encounter (HOSPITAL_COMMUNITY)
Admission: RE | Admit: 2016-05-01 | Discharge: 2016-05-01 | Disposition: A | Discharge status: Home / Self Care | Payer: BLUE CROSS/BLUE SHIELD | Source: Ambulatory Visit | Attending: Obstetrics and Gynecology | Admitting: Obstetrics and Gynecology

## 2016-05-01 DIAGNOSIS — E2839 Other primary ovarian failure: Secondary | ICD-10-CM | POA: Insufficient documentation

## 2016-05-01 DIAGNOSIS — Z01812 Encounter for preprocedural laboratory examination: Secondary | ICD-10-CM | POA: Insufficient documentation

## 2016-05-01 DIAGNOSIS — E288 Other ovarian dysfunction: Principal | ICD-10-CM

## 2016-05-01 HISTORY — DX: Other specified postprocedural states: R11.2

## 2016-05-01 HISTORY — DX: Nausea with vomiting, unspecified: Z98.890

## 2016-05-01 HISTORY — DX: Gastro-esophageal reflux disease without esophagitis: K21.9

## 2016-05-01 HISTORY — DX: Other somatoform disorders: F45.8

## 2016-05-01 LAB — CBC
HCT: 40.9 % (ref 36.0–46.0)
Hemoglobin: 14.5 g/dL (ref 12.0–15.0)
MCH: 31 pg (ref 26.0–34.0)
MCHC: 35.5 g/dL (ref 30.0–36.0)
MCV: 87.6 fL (ref 78.0–100.0)
Platelets: 270 10*3/uL (ref 150–400)
RBC: 4.67 MIL/uL (ref 3.87–5.11)
RDW: 11.8 % (ref 11.5–15.5)
WBC: 8.6 10*3/uL (ref 4.0–10.5)

## 2016-05-06 ENCOUNTER — Encounter: Payer: Self-pay | Admitting: Family Medicine

## 2016-05-06 ENCOUNTER — Telehealth: Payer: Self-pay | Admitting: Obstetrics and Gynecology

## 2016-05-06 NOTE — Anesthesia Preprocedure Evaluation (Addendum)
Anesthesia Evaluation  Patient identified by MRN, date of birth, ID band Patient awake    Reviewed: Allergy & Precautions, NPO status , Patient's Chart, lab work & pertinent test results  History of Anesthesia Complications (+) PONV and history of anesthetic complications  Airway Mallampati: III  TM Distance: >3 FB Neck ROM: Full    Dental no notable dental hx.    Pulmonary former smoker,    Pulmonary exam normal        Cardiovascular negative cardio ROS Normal cardiovascular exam     Neuro/Psych PSYCHIATRIC DISORDERS Anxiety Depression    GI/Hepatic Neg liver ROS, GERD  ,  Endo/Other  Morbid obesity  Renal/GU negative Renal ROS     Musculoskeletal   Abdominal   Peds  Hematology negative hematology ROS (+)   Anesthesia Other Findings   Reproductive/Obstetrics                            Anesthesia Physical Anesthesia Plan  ASA: II  Anesthesia Plan: General   Post-op Pain Management:    Induction: Intravenous  Airway Management Planned: LMA  Additional Equipment:   Intra-op Plan:   Post-operative Plan: Extubation in OR  Informed Consent: I have reviewed the patients History and Physical, chart, labs and discussed the procedure including the risks, benefits and alternatives for the proposed anesthesia with the patient or authorized representative who has indicated his/her understanding and acceptance.   Dental advisory given  Plan Discussed with: CRNA and Surgeon  Anesthesia Plan Comments:        Anesthesia Quick Evaluation scopalamine patch, zofran, decadron, and propofol gtt for PONV prophylaxis

## 2016-05-06 NOTE — Telephone Encounter (Signed)
Left voicemail regarding referral appointment. The information is listed below. Should the patient need to cancel or reschedule this appointment, please advise them to call the office they've been referred to in order to reschedule.  [facility] [address] Phone: [phone]  Appointment with Dr Talmage NapBalan  07/04/16 at 10am  Please arrive 15 minutes early with your insurance card, picture ID and any co-pay or deductible that is required by your insurance.

## 2016-05-07 ENCOUNTER — Encounter (HOSPITAL_COMMUNITY)
Admission: RE | Disposition: A | Discharge status: Home / Self Care | Payer: Self-pay | Source: Ambulatory Visit | Attending: Obstetrics and Gynecology

## 2016-05-07 ENCOUNTER — Ambulatory Visit (HOSPITAL_COMMUNITY): Payer: BLUE CROSS/BLUE SHIELD | Admitting: Anesthesiology

## 2016-05-07 ENCOUNTER — Encounter (HOSPITAL_COMMUNITY): Payer: Self-pay

## 2016-05-07 ENCOUNTER — Ambulatory Visit (HOSPITAL_COMMUNITY)
Admission: RE | Admit: 2016-05-07 | Discharge: 2016-05-07 | Disposition: A | Discharge status: Home / Self Care | Payer: BLUE CROSS/BLUE SHIELD | Source: Ambulatory Visit | Attending: Obstetrics and Gynecology | Admitting: Obstetrics and Gynecology

## 2016-05-07 DIAGNOSIS — Z87891 Personal history of nicotine dependence: Secondary | ICD-10-CM | POA: Diagnosis not present

## 2016-05-07 DIAGNOSIS — N840 Polyp of corpus uteri: Secondary | ICD-10-CM | POA: Diagnosis not present

## 2016-05-07 DIAGNOSIS — K219 Gastro-esophageal reflux disease without esophagitis: Secondary | ICD-10-CM | POA: Insufficient documentation

## 2016-05-07 DIAGNOSIS — N939 Abnormal uterine and vaginal bleeding, unspecified: Secondary | ICD-10-CM | POA: Diagnosis not present

## 2016-05-07 DIAGNOSIS — Z6841 Body Mass Index (BMI) 40.0 and over, adult: Secondary | ICD-10-CM | POA: Diagnosis not present

## 2016-05-07 DIAGNOSIS — N938 Other specified abnormal uterine and vaginal bleeding: Secondary | ICD-10-CM | POA: Insufficient documentation

## 2016-05-07 DIAGNOSIS — E2839 Other primary ovarian failure: Secondary | ICD-10-CM | POA: Insufficient documentation

## 2016-05-07 HISTORY — PX: DILATATION & CURETTAGE/HYSTEROSCOPY WITH MYOSURE: SHX6511

## 2016-05-07 LAB — PREGNANCY, URINE: Preg Test, Ur: NEGATIVE

## 2016-05-07 SURGERY — DILATATION & CURETTAGE/HYSTEROSCOPY WITH MYOSURE
Anesthesia: General | Site: Vagina

## 2016-05-07 MED ORDER — HYDROMORPHONE HCL 1 MG/ML IJ SOLN
0.2500 mg | INTRAMUSCULAR | Status: DC | PRN
  Administered 2016-05-07: 0.25 mg via INTRAVENOUS
  Administered 2016-05-07: 0.5 mg via INTRAVENOUS
  Administered 2016-05-07: 0.25 mg via INTRAVENOUS

## 2016-05-07 MED ORDER — PHENYLEPHRINE HCL 10 MG/ML IJ SOLN: INTRAMUSCULAR | Status: DC | PRN

## 2016-05-07 MED ORDER — HYDROMORPHONE HCL 1 MG/ML IJ SOLN
INTRAMUSCULAR | Status: AC
  Administered 2016-05-07: 0.25 mg via INTRAVENOUS
  Filled 2016-05-07: qty 1

## 2016-05-07 MED ORDER — LIDOCAINE HCL (CARDIAC) 20 MG/ML IV SOLN
INTRAVENOUS | Status: AC
  Filled 2016-05-07: qty 5

## 2016-05-07 MED ORDER — PROMETHAZINE HCL 25 MG/ML IJ SOLN
6.2500 mg | INTRAMUSCULAR | Status: DC | PRN
  Administered 2016-05-07: 6.25 mg via INTRAVENOUS

## 2016-05-07 MED ORDER — KETOROLAC TROMETHAMINE 30 MG/ML IJ SOLN
INTRAMUSCULAR | Status: DC | PRN
  Administered 2016-05-07: 30 mg via INTRAVENOUS

## 2016-05-07 MED ORDER — LACTATED RINGERS IV SOLN: INTRAVENOUS | Status: DC

## 2016-05-07 MED ORDER — LACTATED RINGERS IV SOLN
INTRAVENOUS | Status: DC
  Administered 2016-05-07 (×3): via INTRAVENOUS

## 2016-05-07 MED ORDER — DEXAMETHASONE SODIUM PHOSPHATE 10 MG/ML IJ SOLN
INTRAMUSCULAR | Status: DC | PRN
  Administered 2016-05-07: 4 mg via INTRAVENOUS

## 2016-05-07 MED ORDER — MIDAZOLAM HCL 2 MG/2ML IJ SOLN
INTRAMUSCULAR | Status: DC | PRN
  Administered 2016-05-07: 2 mg via INTRAVENOUS

## 2016-05-07 MED ORDER — PROMETHAZINE HCL 25 MG/ML IJ SOLN
INTRAMUSCULAR | Status: AC
  Administered 2016-05-07: 6.25 mg via INTRAVENOUS
  Filled 2016-05-07: qty 1

## 2016-05-07 MED ORDER — ONDANSETRON HCL 4 MG/2ML IJ SOLN
INTRAMUSCULAR | Status: DC | PRN
  Administered 2016-05-07: 4 mg via INTRAVENOUS

## 2016-05-07 MED ORDER — PROPOFOL 10 MG/ML IV BOLUS
INTRAVENOUS | Status: DC | PRN
  Administered 2016-05-07: 200 mg via INTRAVENOUS
  Administered 2016-05-07 (×2): 20 mg via INTRAVENOUS

## 2016-05-07 MED ORDER — PROPOFOL 10 MG/ML IV BOLUS
INTRAVENOUS | Status: AC
  Filled 2016-05-07: qty 40

## 2016-05-07 MED ORDER — SCOPOLAMINE 1 MG/3DAYS TD PT72
1.0000 | MEDICATED_PATCH | Freq: Once | TRANSDERMAL | Status: DC
  Administered 2016-05-07: 1.5 mg via TRANSDERMAL

## 2016-05-07 MED ORDER — SCOPOLAMINE 1 MG/3DAYS TD PT72
MEDICATED_PATCH | TRANSDERMAL | Status: AC
  Administered 2016-05-07: 1.5 mg via TRANSDERMAL
  Filled 2016-05-07: qty 1

## 2016-05-07 MED ORDER — PHENYLEPHRINE 40 MCG/ML (10ML) SYRINGE FOR IV PUSH (FOR BLOOD PRESSURE SUPPORT)
PREFILLED_SYRINGE | INTRAVENOUS | Status: AC
  Filled 2016-05-07: qty 10

## 2016-05-07 MED ORDER — FENTANYL CITRATE (PF) 100 MCG/2ML IJ SOLN
INTRAMUSCULAR | Status: AC
  Filled 2016-05-07: qty 2

## 2016-05-07 MED ORDER — FENTANYL CITRATE (PF) 100 MCG/2ML IJ SOLN
INTRAMUSCULAR | Status: DC | PRN
  Administered 2016-05-07: 100 ug via INTRAVENOUS

## 2016-05-07 MED ORDER — PHENYLEPHRINE HCL 10 MG/ML IJ SOLN
INTRAMUSCULAR | Status: DC | PRN
  Administered 2016-05-07: 80 ug via INTRAVENOUS

## 2016-05-07 MED ORDER — MIDAZOLAM HCL 2 MG/2ML IJ SOLN
INTRAMUSCULAR | Status: AC
  Filled 2016-05-07: qty 2

## 2016-05-07 MED ORDER — DEXAMETHASONE SODIUM PHOSPHATE 4 MG/ML IJ SOLN
INTRAMUSCULAR | Status: AC
  Filled 2016-05-07: qty 1

## 2016-05-07 MED ORDER — ONDANSETRON HCL 4 MG/2ML IJ SOLN
INTRAMUSCULAR | Status: AC
  Filled 2016-05-07: qty 2

## 2016-05-07 MED ORDER — LIDOCAINE HCL (CARDIAC) 20 MG/ML IV SOLN
INTRAVENOUS | Status: DC | PRN
  Administered 2016-05-07: 60 mg via INTRAVENOUS

## 2016-05-07 SURGICAL SUPPLY — 19 items
CANISTER SUCT 3000ML (MISCELLANEOUS) ×2 IMPLANT
CATH ROBINSON RED A/P 16FR (CATHETERS) ×2 IMPLANT
CLOTH BEACON ORANGE TIMEOUT ST (SAFETY) ×2 IMPLANT
CONTAINER PREFILL 10% NBF 60ML (FORM) ×6 IMPLANT
DEVICE MYOSURE LITE (MISCELLANEOUS) ×2 IMPLANT
DEVICE MYOSURE REACH (MISCELLANEOUS) IMPLANT
FILTER ARTHROSCOPY CONVERTOR (FILTER) ×2 IMPLANT
GLOVE BIO SURGEON STRL SZ 6.5 (GLOVE) ×2 IMPLANT
GLOVE BIOGEL PI IND STRL 7.0 (GLOVE) ×2 IMPLANT
GLOVE BIOGEL PI INDICATOR 7.0 (GLOVE) ×2
GOWN STRL REUS W/TWL LRG LVL3 (GOWN DISPOSABLE) ×4 IMPLANT
PACK VAGINAL MINOR WOMEN LF (CUSTOM PROCEDURE TRAY) ×2 IMPLANT
PAD OB MATERNITY 4.3X12.25 (PERSONAL CARE ITEMS) ×2 IMPLANT
SEAL ROD LENS SCOPE MYOSURE (ABLATOR) ×2 IMPLANT
SYR 20CC LL (SYRINGE) IMPLANT
TOWEL OR 17X24 6PK STRL BLUE (TOWEL DISPOSABLE) ×4 IMPLANT
TUBING AQUILEX INFLOW (TUBING) ×2 IMPLANT
TUBING AQUILEX OUTFLOW (TUBING) ×2 IMPLANT
WATER STERILE IRR 1000ML POUR (IV SOLUTION) ×2 IMPLANT

## 2016-05-07 NOTE — Transfer of Care (Signed)
Immediate Anesthesia Transfer of Care Note  Patient: Jessica Silva  Procedure(s) Performed: Procedure(s): DILATATION & CURETTAGE/HYSTEROSCOPY WITH MYOSURE (N/A)  Patient Location: PACU  Anesthesia Type:General  Level of Consciousness: awake, alert  and oriented  Airway & Oxygen Therapy: Patient Spontanous Breathing and Patient connected to nasal cannula oxygen  Post-op Assessment: Report given to RN and Post -op Vital signs reviewed and stable  Post vital signs: Reviewed and stable  Last Vitals:  Vitals:   05/07/16 0622  BP: 113/67  Pulse: 76  Resp: 16  Temp: 36.9 C    Last Pain:  Vitals:   05/07/16 0622  TempSrc: Oral      Patients Stated Pain Goal: 3 (05/07/16 0622)  Complications: No apparent anesthesia complications

## 2016-05-07 NOTE — Anesthesia Procedure Notes (Signed)
Procedure Name: LMA Insertion Performed by: Nyima Vanacker M Pre-anesthesia Checklist: Patient identified, Emergency Drugs available, Suction available, Patient being monitored and Timeout performed Patient Re-evaluated:Patient Re-evaluated prior to inductionOxygen Delivery Method: Circle system utilized Preoxygenation: Pre-oxygenation with 100% oxygen Intubation Type: IV induction LMA: LMA inserted LMA Size: 4.0 Number of attempts: 1 Placement Confirmation: positive ETCO2 and breath sounds checked- equal and bilateral Tube secured with: Tape Dental Injury: Teeth and Oropharynx as per pre-operative assessment        

## 2016-05-07 NOTE — Op Note (Signed)
Preoperative Diagnosis: Premature ovarian failure, abnormal vaginal bleeding, endometrial polyp  Postoperative Diagnosis: Same  Procedure: Hysteroscopy, polypectomy, dilation and curettage  Surgeon: Dr Gertie ExonJill Briann Sarchet  Assistants: None  Anesthesia: General via LMA  EBL: 5 cc  Fluids: 1,500 cc LR  Fluid deficit: 115 cc  Urine output: 200 cc  Indications for surgery: The patient is a 30 yo female, who presented with premature ovarian failure and vaginal bleeding. Work up included labs consistent with premature ovarian failure and a sonohysterogram that revealed a large endometrial polyp The risks of the surgery were reviewed with the patient and the consent form was signed prior to her surgery.  Findings: EUA: normal sized uterus, no adnexal masses. Hysteroscopy: large endometrial polyp, otherwise thin endometrium, normal tubal ostia bilaterally  Specimens: endometrial polyp, endocervical curettage, endometrial curettage    Procedure: The patient was taken to the operating room with an IV in place. She was placed in the dorsal lithotomy position and anesthesia was administered. She was prepped and draped in the usual sterile fashion for a vaginal procedure. She was straight catheterized. A weighted speculum was placed in the vagina and a single tooth tenaculum was placed on the anterior lip of the cervix. The cervix was dilated to a #7 hagar dilator. The uterus was sounded to 7 cm. The myosure hysteroscope was inserted into the uterine cavity. With continuous infusion of normal saline, the uterine cavity was visualized with the above findings. The myosure light was used to resect the endometrial polyp. The myosure was then removed. The endocervical curettage was obtained. The endometrial cavity was then curetted with the small sharp curette, minimal tissue was obtained. The cavity had the characteristically gritty texture at the end of the procedure. The curette and the single tooth tenaculum  were removed. Oozing from the tenaculum site was stopped with pressure. The speculum was removed. The patients perineum was cleansed of betadine and she was taken out of the dorsal lithotomy position.  Upon awakening the LMA was removed and the patient was transferred to the recovery room in stable and awake condition.  The sponge and instrument count were correct. There were no complications.

## 2016-05-07 NOTE — Interval H&P Note (Signed)
History and Physical Interval Note:  05/07/2016 7:14 AM  Jessica Silva  has presented today for surgery, with the diagnosis of endometrial polyp  The various methods of treatment have been discussed with the patient and family. After consideration of risks, benefits and other options for treatment, the patient has consented to  Procedure(s): DILATATION & CURETTAGE/HYSTEROSCOPY WITH MYOSURE (N/A) as a surgical intervention .  The patient's history has been reviewed, patient examined, no change in status, stable for surgery.  I have reviewed the patient's chart and labs.  Questions were answered to the patient's satisfaction.     Romualdo BolkJill Evelyn Krystopher Silva

## 2016-05-07 NOTE — Discharge Instructions (Signed)
PATIENT INSTRUCTIONS POST-ANESTHESIA  IMMEDIATELY FOLLOWING SURGERY:  Do not drive or operate machinery for the first twenty four hours after surgery.  Do not make any important decisions for twenty four hours after surgery or while taking narcotic pain medications or sedatives.  If you develop intractable nausea and vomiting or a severe headache please notify your doctor immediately.  FOLLOW-UP:  Please make an appointment with your surgeon as instructed. You do not need to follow up with anesthesia unless specifically instructed to do so.  QUESTIONS?:  Please feel free to call your physician or the hospital operator if you have any questions, and they will be happy to assist you.      Dilation and Curettage or Vacuum Curettage, Care After Refer to this sheet in the next few weeks. These instructions provide you with information on caring for yourself after your procedure. Your health care provider may also give you more specific instructions. Your treatment has been planned according to current medical practices, but problems sometimes occur. Call your health care provider if you have any problems or questions after your procedure. WHAT TO EXPECT AFTER THE PROCEDURE After your procedure, it is typical to have light cramping and bleeding. This may last for 2 days to 2 weeks after the procedure. HOME CARE INSTRUCTIONS   Do not drive for 24 hours.  Wait 1 week before returning to strenuous activities.  Take your temperature 2 times a day for 4 days and write it down. Provide these temperatures to your health care provider if you develop a fever.  Avoid long periods of standing.  Avoid heavy lifting, pushing, or pulling. Do not lift anything heavier than 10 pounds (4.5 kg).  Limit stair climbing to once or twice a day.  Take rest periods often.  You may resume your usual diet.  Drink enough fluids to keep your urine clear or pale yellow.  Your usual bowel function should return. If  you have constipation, you may:  Take a mild laxative with permission from your health care provider.  Add fruit and bran to your diet.  Drink more fluids.  Take showers instead of baths until your health care provider gives you permission to take baths.  Do not go swimming or use a hot tub until your health care provider approves.  Try to have someone with you or available to you the first 24-48 hours, especially if you were given a general anesthetic.  Do not douche, use tampons, or have sex (intercourse) for 2 weeks after the procedure.  Only take over-the-counter or prescription medicines as directed by your health care provider. Do not take aspirin. It can cause bleeding.  Follow up with your health care provider as directed. SEEK MEDICAL CARE IF:   You have increasing cramps or pain that is not relieved with medicine.  You have abdominal pain that does not seem to be related to the same area of earlier cramping and pain.  You have bad smelling vaginal discharge.  You have a rash.  You are having problems with any medicine. SEEK IMMEDIATE MEDICAL CARE IF:   You have bleeding that is heavier than a normal menstrual period.  You have a fever.  You have chest pain.  You have shortness of breath.  You feel dizzy or feel like fainting.  You pass out.  You have pain in your shoulder strap area.  You have heavy vaginal bleeding with or without blood clots. MAKE SURE YOU:   Understand these instructions.  Will watch your condition.  Will get help right away if you are not doing well or get worse.   This information is not intended to replace advice given to you by your health care provider. Make sure you discuss any questions you have with your health care provider.   Document Released: 08/23/2000 Document Revised: 08/31/2013 Document Reviewed: 03/25/2013 Elsevier Interactive Patient Education 2016 [employer]   Post Anesthesia Home Care  Instructions  Activity: Get plenty of rest for the remainder of the day. A responsible adult should stay with you for 24 hours following the procedure.  For the next 24 hours, DO NOT: -Drive a car -Advertising copywriterperate machinery -Drink alcoholic beverages -Take any medication unless instructed by your physician -Make any legal decisions or sign important papers.  Meals: Start with liquid foods such as gelatin or soup. Progress to regular foods as tolerated. Avoid greasy, spicy, heavy foods. If nausea and/or vomiting occur, drink only clear liquids until the nausea and/or vomiting subsides. Call your physician if vomiting continues.  Special Instructions/Symptoms: Your throat may feel dry or sore from the anesthesia or the breathing tube placed in your throat during surgery. If this causes discomfort, gargle with warm salt water. The discomfort should disappear within 24 hours.  If you had a scopolamine patch placed behind your ear for the management of post- operative nausea and/or vomiting:  1. The medication in the patch is effective for 72 hours, after which it should be removed.  Wrap patch in a tissue and discard in the trash. Wash hands thoroughly with soap and water. 2. You may remove the patch earlier than 72 hours if you experience unpleasant side effects which may include dry mouth, dizziness or visual disturbances. 3. Avoid touching the patch. Wash your hands with soap and water after contact with the patch.

## 2016-05-07 NOTE — Anesthesia Postprocedure Evaluation (Signed)
Anesthesia Post Note  Patient: Jessica Silva  Procedure(s) Performed: Procedure(s) (LRB): DILATATION & CURETTAGE/HYSTEROSCOPY WITH MYOSURE (N/A)  Patient location during evaluation: PACU Anesthesia Type: General Level of consciousness: awake and alert Pain management: pain level controlled Vital Signs Assessment: post-procedure vital signs reviewed and stable Respiratory status: spontaneous breathing, nonlabored ventilation, respiratory function stable and patient connected to nasal cannula oxygen Cardiovascular status: blood pressure returned to baseline and stable Postop Assessment: no signs of nausea or vomiting Anesthetic complications: no     Last Vitals:  Vitals:   05/07/16 0815 05/07/16 0830  BP: 115/73 (!) 114/57  Pulse: 96 88  Resp: 13 14  Temp:      Last Pain:  Vitals:   05/07/16 0830  TempSrc:   PainSc: 6    Pain Goal: Patients Stated Pain Goal: 3 (05/07/16 0622)               Bonita Quinichard S Carsin Randazzo

## 2016-05-08 ENCOUNTER — Encounter (HOSPITAL_COMMUNITY): Payer: Self-pay | Admitting: Obstetrics and Gynecology

## 2016-05-10 ENCOUNTER — Telehealth: Payer: Self-pay

## 2016-05-10 DIAGNOSIS — E288 Other ovarian dysfunction: Principal | ICD-10-CM

## 2016-05-10 DIAGNOSIS — E2839 Other primary ovarian failure: Secondary | ICD-10-CM

## 2016-05-10 DIAGNOSIS — N979 Female infertility, unspecified: Secondary | ICD-10-CM

## 2016-05-10 NOTE — Telephone Encounter (Signed)
-----   Message from Romualdo BolkJill Evelyn Jertson, MD sent at 05/09/2016  5:23 PM EDT ----- Spoke with the patient, she has felt slightly dizzy since she took the scopolamine patch off today. The bleeding has stopped, slight cramping. She will call if the dizziness doesn't improve by tomorrow.   Please set the patient up to see the infertility specialist for premature ovarian failure and associated infertility. She would like to get pregnant.

## 2016-05-10 NOTE — Telephone Encounter (Signed)
Referral entered into EPIC for patient to be seen with Dr.Yalcinkaya. Routing to Braxton Feathersebecca Frahm for referral scheduling.  Cc: Dr.Jertson  Routing to provider for final review. Patient agreeable to disposition. Will close encounter.

## 2016-05-20 ENCOUNTER — Ambulatory Visit (INDEPENDENT_AMBULATORY_CARE_PROVIDER_SITE_OTHER): Payer: BLUE CROSS/BLUE SHIELD | Admitting: Obstetrics and Gynecology

## 2016-05-20 ENCOUNTER — Encounter: Payer: Self-pay | Admitting: Obstetrics and Gynecology

## 2016-05-20 VITALS — BP 118/80 | HR 74 | Resp 12 | Ht 61.0 in | Wt 235.6 lb

## 2016-05-20 DIAGNOSIS — E288 Other ovarian dysfunction: Secondary | ICD-10-CM | POA: Diagnosis not present

## 2016-05-20 DIAGNOSIS — E2839 Other primary ovarian failure: Secondary | ICD-10-CM | POA: Insufficient documentation

## 2016-05-20 DIAGNOSIS — Z9889 Other specified postprocedural states: Secondary | ICD-10-CM | POA: Diagnosis not present

## 2016-05-20 NOTE — Progress Notes (Signed)
GYNECOLOGY  VISIT   HPI: 30 y.o.   Married  Caucasian  female   [id number] with Patient's last menstrual period was 04/19/2016 (exact date).   here for 2 week f/u of D&C Hysteroscopy with Myosure. The patient has a h/o premature ovarian failure, she was having irregular bleeding. Evaluation revealed a large endometrial polyp. She is 2 weeks s/p hysteroscopy, polypectomy, dilation and curettage. Pathology was benign. Doing well since surgery, no c/o.  GYNECOLOGIC HISTORY: Patient's last menstrual period was 04/19/2016 (exact date). Contraception:none Menopausal hormone therapy: none        OB History    Gravida Para Term Preterm AB Living   0 0 0 0 0 0   SAB TAB Ectopic Multiple Live Births   0 0 0 0 0         Patient Active Problem List   Diagnosis Date Noted  . Depression 07/17/2015  . Weight gain 07/14/2013    Past Medical History:  Diagnosis Date  . Abnormal uterine bleeding   . Amenorrhea   . Anxiety   . Depression   . GERD (gastroesophageal reflux disease)   . Hormone disorder   . Infertility, female   . PONV (postoperative nausea and vomiting)   . Premature ovarian failure 2002  . Premature ovarian failure 2002  . Psychogenic air hunger     Past Surgical History:  Procedure Laterality Date  . DILATATION & CURETTAGE/HYSTEROSCOPY WITH MYOSURE N/A 05/07/2016   Procedure: DILATATION & CURETTAGE/HYSTEROSCOPY WITH MYOSURE;  Surgeon: Romualdo BolkJill Evelyn Jertson, MD;  Location: [facility];  Service: Gynecology;  Laterality: N/A;  . TONSILLECTOMY    . TONSILLECTOMY AND ADENOIDECTOMY    . WISDOM TOOTH EXTRACTION      Current Outpatient Prescriptions  Medication Sig Dispense Refill  . buPROPion (WELLBUTRIN XL) 300 MG 24 hr tablet TAKE 1 TABLET (300 MG TOTAL) BY MOUTH DAILY. 90 tablet 0  . calcium carbonate (TUMS - DOSED IN MG ELEMENTAL CALCIUM) 500 MG chewable tablet Chew 1 tablet by mouth daily as needed for indigestion or heartburn.    Marland Kitchen. EPINEPHrine (EPIPEN) 0.3 mg/0.3 mL DEVI  Inject 0.3 mLs (0.3 mg total) into the muscle as needed. 2 Device 1  . ibuprofen (ADVIL,MOTRIN) 200 MG tablet Take 400 mg by mouth every 6 (six) hours as needed for mild pain.    . Prenatal Vit-Fe Fumarate-FA (PRENATAL MULTIVITAMIN) TABS tablet Take 1 tablet by mouth daily at 12 noon.     No current facility-administered medications for this visit.      ALLERGIES: Bee venom and Penicillins  Family History  Problem Relation Age of Onset  . Hiatal hernia Mother   . Hypertension Sister   . Polycystic ovary syndrome Sister   . Irritable bowel syndrome Brother   . Hiatal hernia Maternal Grandmother   . Irritable bowel syndrome Brother     Social History   Social History  . Marital status: Married    Spouse name: N/A  . Number of children: N/A  . Years of education: N/A   Occupational History  . [employer]   Social History Main Topics  . Smoking status: Former Smoker    Packs/day: 0.10    Years: 1.00  . Smokeless tobacco: Never Used     Comment: has not smoked last 2 days  . Alcohol use 0.0 - 0.6 oz/week     Comment: occasional  . Drug use: No  . Sexual activity: Yes    Partners: Male  Birth control/ protection: None     Comment: has premature ovarian failure   Other Topics Concern  . Not on file   Social History Narrative  . No narrative on file    Review of Systems  Constitutional: Negative.   HENT: Negative.   Eyes: Negative.   Respiratory: Negative.   Cardiovascular: Negative.   Gastrointestinal: Negative.   Genitourinary: Negative.   Musculoskeletal: Negative.   Skin: Negative.   Neurological: Negative.   Endo/Heme/Allergies: Negative.   Psychiatric/Behavioral: Negative.     PHYSICAL EXAMINATION:    BP 118/80 (BP Location: Right Arm, Patient Position: Sitting, Cuff Size: Large)   Pulse 74   Resp 12   Ht 5\' 1"  (1.549 m)   Wt 235 lb 9.6 oz (106.9 kg)   LMP 04/19/2016 (Exact Date)   BMI 44.52 kg/m     General appearance: alert,  cooperative and appears stated age Abdomen: soft, non-tender;  no masses,  no organomegaly   ASSESSMENT S/P hysteroscopy, polypectomy, D&C. Benign pathology. Thin endometrium c/w POF Premature ovarian failure, she does have some thyroid antibodies Desires pregnancy    PLAN She has an appointment to f/u with endocrinology She has an appointment to f/u with the infertility specialist, interested in donor egg She will schedule her DEXA, (aware of the risk of bone loss) We discussed the importance of being on OCP's or HRT if she is not undergoing infertility treatment She wants to hold off on OCP's until after infertility evaluation She will call with any bleeding   An After Visit Summary was printed and given to the patient.  15 minutes face to face time of which over 50% was spent in counseling.

## 2016-05-24 NOTE — Telephone Encounter (Signed)
Call to patient to discuss attempts to refer to Dr Lorenza ChickYalcinkayas office. Attempts unreturned. Left voicemail to return call.

## 2016-05-28 ENCOUNTER — Other Ambulatory Visit: Payer: Self-pay | Admitting: Family Medicine

## 2016-07-04 DIAGNOSIS — E063 Autoimmune thyroiditis: Secondary | ICD-10-CM | POA: Diagnosis not present

## 2016-07-04 DIAGNOSIS — E319 Polyglandular dysfunction, unspecified: Secondary | ICD-10-CM | POA: Diagnosis not present

## 2016-07-04 DIAGNOSIS — E288 Other ovarian dysfunction: Secondary | ICD-10-CM | POA: Diagnosis not present

## 2016-07-11 DIAGNOSIS — E2839 Other primary ovarian failure: Secondary | ICD-10-CM | POA: Diagnosis not present

## 2016-08-12 ENCOUNTER — Other Ambulatory Visit: Payer: Self-pay | Admitting: Family Medicine

## 2016-08-15 ENCOUNTER — Other Ambulatory Visit: Payer: Self-pay | Admitting: Family Medicine

## 2016-08-16 MED ORDER — BUPROPION HCL ER (XL) 300 MG PO TB24: 300.0000 mg | ORAL_TABLET | Freq: Every day | ORAL | 0 refills | Status: DC

## 2016-08-16 NOTE — Telephone Encounter (Signed)
Jessica Silva. Beals would like a refill of the following medications:     buPROPion (WELLBUTRIN XL) 300 MG 24 hr tablet [Yvonne R Carollee Herter, DO]   Patient Comment: I was told I need a doctor visit before a refill can be made. I've been to multiple doctors for endocrinology purposes since we last met and blood work has been done. I cannot make it to an appointment before January, but I don't want to run out and experience the symptoms that come from that. What can I do?    Preferred pharmacy: [facility], [place] [address]   See Pt message above. Please advise.

## 2016-08-29 DIAGNOSIS — E2839 Other primary ovarian failure: Secondary | ICD-10-CM | POA: Diagnosis not present

## 2016-08-29 DIAGNOSIS — E039 Hypothyroidism, unspecified: Secondary | ICD-10-CM | POA: Diagnosis not present

## 2016-09-30 DIAGNOSIS — N8500 Endometrial hyperplasia, unspecified: Secondary | ICD-10-CM | POA: Diagnosis not present

## 2016-09-30 DIAGNOSIS — Z113 Encounter for screening for infections with a predominantly sexual mode of transmission: Secondary | ICD-10-CM | POA: Diagnosis not present

## 2016-09-30 DIAGNOSIS — Z3141 Encounter for fertility testing: Secondary | ICD-10-CM | POA: Diagnosis not present

## 2016-09-30 DIAGNOSIS — Z319 Encounter for procreative management, unspecified: Secondary | ICD-10-CM | POA: Diagnosis not present

## 2016-10-03 ENCOUNTER — Encounter: Payer: Self-pay | Admitting: Obstetrics and Gynecology

## 2016-10-09 ENCOUNTER — Telehealth: Payer: Self-pay

## 2016-10-09 NOTE — Telephone Encounter (Signed)
Yes, please give her those recommendations. Let her know I'm very excited for her and ask her to please let me know how it goes.

## 2016-10-09 NOTE — Telephone Encounter (Signed)
Non-Urgent Medical Question  Message [phone]  From [place] To Romualdo BolkJill Evelyn Jertson, MD Sent 10/03/2016 11:49 AM  Hey Dr. Oscar LaJertson!  I wanted to let you know that we are in the process of starting our IVF with donor egg treatment. We are looking at a transfer date of Mar 10th at this point. I absolutely loved working with you, but I know you are gynecology only. Do you have a recommendation of who I can go see for OB?   Thanks!  [place]   Responsible Party   Pool - [facility] Pool No one has taken responsibility for this message.  No actions have been taken on this message.   Dr.Jertson, okay to refer patient to [facility] or [facility]?

## 2016-10-09 NOTE — Telephone Encounter (Signed)
Left message to call Kaitlyn at [phone]. 

## 2016-10-09 NOTE — Telephone Encounter (Signed)
Telephone encounter created to review with Dr.Jertson. 

## 2016-10-16 NOTE — Telephone Encounter (Signed)
Spoke with patient. Advised of recommendations to see [facility] [phone] or [facility] [phone]. Patient is agreeable and verbalizes understanding. Will call to give Dr.Jertson an update on how IVF goes.  Routing to provider for final review. Patient agreeable to disposition. Will close encounter.

## 2016-11-18 ENCOUNTER — Other Ambulatory Visit: Payer: Self-pay | Admitting: Family Medicine

## 2016-11-19 ENCOUNTER — Telehealth: Payer: Self-pay | Admitting: Family Medicine

## 2016-11-19 NOTE — Telephone Encounter (Signed)
OK with me.

## 2016-11-19 NOTE — Telephone Encounter (Signed)
Pt would like to switch to Dr Caryl NeverBurchette from Dr Laury AxonLowne.   Pt made her own appointment on Mychart for 3/19 to switch providers, but she only saw Dr Caryl NeverBurchette for an acute issue 03/1716.  She states her husband sees you. Please advise if ok for pt to switch.  Thanks!

## 2016-11-20 NOTE — Telephone Encounter (Signed)
Ok with you dr Laury AxonLowne for pt to switch to Dr Caryl NeverBurchette?

## 2016-11-20 NOTE — Telephone Encounter (Signed)
Fine with me

## 2016-11-22 NOTE — Telephone Encounter (Signed)
Pt is aware.  

## 2016-11-25 ENCOUNTER — Ambulatory Visit (INDEPENDENT_AMBULATORY_CARE_PROVIDER_SITE_OTHER): Payer: BLUE CROSS/BLUE SHIELD | Admitting: Family Medicine

## 2016-11-25 ENCOUNTER — Encounter: Payer: Self-pay | Admitting: Family Medicine

## 2016-11-25 VITALS — BP 102/70 | HR 96 | Temp 98.8°F | Wt 245.6 lb

## 2016-11-25 DIAGNOSIS — R5383 Other fatigue: Secondary | ICD-10-CM

## 2016-11-25 DIAGNOSIS — Z113 Encounter for screening for infections with a predominantly sexual mode of transmission: Secondary | ICD-10-CM | POA: Diagnosis not present

## 2016-11-25 MED ORDER — BUPROPION HCL ER (XL) 150 MG PO TB24: 150.0000 mg | ORAL_TABLET | Freq: Every day | ORAL | 3 refills | Status: DC

## 2016-11-25 NOTE — Patient Instructions (Signed)
Go ahead and reduce the Wellbutrin to 150 mg. Let me know if you have an excessive fatigue or increased depressed mood.

## 2016-11-25 NOTE — Progress Notes (Signed)
Pre visit review using our clinic review tool, if applicable. No additional management support is needed unless otherwise documented below in the visit note. 

## 2016-11-25 NOTE — Progress Notes (Signed)
Subjective:     Patient ID: Jessica Silva, female   DOB: November 20, 1985, 31 y.o.   MRN: [id number]  HPI Patient is seen to establish care. Have seen her husband for several years. She has history of hypothyroidism and is currently undergoing infertility treatment.  She was placed on Wellbutrin XL 300 mg daily about 2 and one half years ago and apparently this was more for lethargy symptoms and maybe some difficulty focusing but only mild depression. Denies any chronic anxiety issues. She is interested in trying to taper this back. She sometimes notices increased pulse. No other side effects. Denies any history of major depression  Past Medical History:  Diagnosis Date  . Abnormal uterine bleeding   . Amenorrhea   . Anxiety   . Depression   . GERD (gastroesophageal reflux disease)   . Hormone disorder   . Infertility, female   . PONV (postoperative nausea and vomiting)   . Premature ovarian failure 2002  . Premature ovarian failure 2002  . Psychogenic air hunger    Past Surgical History:  Procedure Laterality Date  . DILATATION & CURETTAGE/HYSTEROSCOPY WITH MYOSURE N/A 05/07/2016   Procedure: DILATATION & CURETTAGE/HYSTEROSCOPY WITH MYOSURE;  Surgeon: Romualdo BolkJill Evelyn Jertson, MD;  Location: [facility];  Service: Gynecology;  Laterality: N/A;  . TONSILLECTOMY    . TONSILLECTOMY AND ADENOIDECTOMY    . WISDOM TOOTH EXTRACTION      reports that she has quit smoking. She has a 0.10 pack-year smoking history. She has never used smokeless tobacco. She reports that she drinks alcohol. She reports that she does not use drugs. family history includes Hiatal hernia in her maternal grandmother and mother; Hypertension in her sister; Irritable bowel syndrome in her brother and brother; Polycystic ovary syndrome in her sister. Allergies  Allergen Reactions  . Bee Venom Hives  . Penicillins Hives    Has patient had a PCN reaction causing immediate rash, facial/tongue/throat swelling, SOB or lightheadedness  with hypotension: unknown Has patient had a PCN reaction causing severe rash involving mucus membranes or skin necrosis: unknown Has patient had a PCN reaction that required hospitalization no Has patient had a PCN reaction occurring within the last 10 years: no If all of the above answers are "NO", then may proceed with Cephalosporin use.      Review of Systems  Constitutional: Negative for fatigue.  Eyes: Negative for visual disturbance.  Respiratory: Negative for cough, chest tightness, shortness of breath and wheezing.   Cardiovascular: Negative for chest pain, palpitations and leg swelling.  Neurological: Negative for dizziness, seizures, syncope, weakness, light-headedness and headaches.       Objective:   Physical Exam  Constitutional: She appears well-developed and well-nourished.  Eyes: Pupils are equal, round, and reactive to light.  Neck: Neck supple. No JVD present. No thyromegaly present.  Cardiovascular: Normal rate and regular rhythm.  Exam reveals no gallop.   Pulmonary/Chest: Effort normal and breath sounds normal. No respiratory distress. She has no wheezes. She has no rales.  Musculoskeletal: She exhibits no edema.  Neurological: She is alert.       Assessment:     History of fatigue and mild depressive symptoms currently stable    Plan:     -Try tapering back Wellbutrin XL to 150 mgs once daily -may eventually try tapering off depending on how she is doing.  Kristian CoveyBruce W Arisbeth Purrington MD [facility]

## 2016-11-27 DIAGNOSIS — Z113 Encounter for screening for infections with a predominantly sexual mode of transmission: Secondary | ICD-10-CM | POA: Diagnosis not present

## 2016-11-27 DIAGNOSIS — Z3201 Encounter for pregnancy test, result positive: Secondary | ICD-10-CM | POA: Diagnosis not present

## 2016-12-02 ENCOUNTER — Telehealth: Payer: Self-pay | Admitting: Obstetrics and Gynecology

## 2016-12-02 NOTE — Telephone Encounter (Signed)
The patient underwent an embryo transfer a few weeks ago. She is 4+ weeks pregnant. Doing well so far. I wished her luck.

## 2016-12-10 DIAGNOSIS — O034 Incomplete spontaneous abortion without complication: Secondary | ICD-10-CM | POA: Diagnosis not present

## 2017-01-03 DIAGNOSIS — O039 Complete or unspecified spontaneous abortion without complication: Secondary | ICD-10-CM | POA: Diagnosis not present

## 2017-01-03 DIAGNOSIS — N939 Abnormal uterine and vaginal bleeding, unspecified: Secondary | ICD-10-CM | POA: Diagnosis not present

## 2017-01-16 DIAGNOSIS — O039 Complete or unspecified spontaneous abortion without complication: Secondary | ICD-10-CM | POA: Diagnosis not present

## 2017-01-22 DIAGNOSIS — N840 Polyp of corpus uteri: Secondary | ICD-10-CM | POA: Diagnosis not present

## 2017-01-22 DIAGNOSIS — O039 Complete or unspecified spontaneous abortion without complication: Secondary | ICD-10-CM | POA: Diagnosis not present

## 2017-01-31 DIAGNOSIS — O021 Missed abortion: Secondary | ICD-10-CM | POA: Diagnosis not present

## 2017-01-31 DIAGNOSIS — O032 Embolism following incomplete spontaneous abortion: Secondary | ICD-10-CM | POA: Diagnosis not present

## 2017-02-21 DIAGNOSIS — E28310 Symptomatic premature menopause: Secondary | ICD-10-CM | POA: Diagnosis not present

## 2017-02-21 DIAGNOSIS — Z319 Encounter for procreative management, unspecified: Secondary | ICD-10-CM | POA: Diagnosis not present

## 2017-02-21 DIAGNOSIS — N8500 Endometrial hyperplasia, unspecified: Secondary | ICD-10-CM | POA: Diagnosis not present

## 2017-02-21 DIAGNOSIS — Z3141 Encounter for fertility testing: Secondary | ICD-10-CM | POA: Diagnosis not present

## 2017-03-17 DIAGNOSIS — E288 Other ovarian dysfunction: Secondary | ICD-10-CM | POA: Diagnosis not present

## 2017-04-07 DIAGNOSIS — Z3200 Encounter for pregnancy test, result unknown: Secondary | ICD-10-CM | POA: Diagnosis not present

## 2017-05-23 DIAGNOSIS — E2839 Other primary ovarian failure: Secondary | ICD-10-CM | POA: Diagnosis not present

## 2017-05-23 DIAGNOSIS — Z319 Encounter for procreative management, unspecified: Secondary | ICD-10-CM | POA: Diagnosis not present

## 2017-06-06 ENCOUNTER — Ambulatory Visit: Payer: Self-pay | Admitting: Family Medicine

## 2017-06-09 ENCOUNTER — Encounter: Payer: Self-pay | Admitting: Family Medicine

## 2017-06-09 ENCOUNTER — Other Ambulatory Visit: Payer: Self-pay | Admitting: *Deleted

## 2017-06-09 MED ORDER — BUPROPION HCL ER (XL) 300 MG PO TB24: 300.0000 mg | ORAL_TABLET | Freq: Every day | ORAL | 0 refills | Status: DC

## 2017-06-09 NOTE — Telephone Encounter (Signed)
Rx done. 

## 2017-07-09 DIAGNOSIS — Z3200 Encounter for pregnancy test, result unknown: Secondary | ICD-10-CM | POA: Diagnosis not present

## 2017-07-09 DIAGNOSIS — O032 Embolism following incomplete spontaneous abortion: Secondary | ICD-10-CM | POA: Diagnosis not present

## 2017-07-09 DIAGNOSIS — O039 Complete or unspecified spontaneous abortion without complication: Secondary | ICD-10-CM | POA: Diagnosis not present

## 2017-07-09 DIAGNOSIS — Z319 Encounter for procreative management, unspecified: Secondary | ICD-10-CM | POA: Diagnosis not present

## 2017-07-09 DIAGNOSIS — Z3141 Encounter for fertility testing: Secondary | ICD-10-CM | POA: Diagnosis not present

## 2017-07-18 DIAGNOSIS — Z319 Encounter for procreative management, unspecified: Secondary | ICD-10-CM | POA: Diagnosis not present

## 2017-07-18 DIAGNOSIS — N8500 Endometrial hyperplasia, unspecified: Secondary | ICD-10-CM | POA: Diagnosis not present

## 2017-07-18 DIAGNOSIS — Z3141 Encounter for fertility testing: Secondary | ICD-10-CM | POA: Diagnosis not present

## 2017-08-06 DIAGNOSIS — E039 Hypothyroidism, unspecified: Secondary | ICD-10-CM | POA: Diagnosis not present

## 2017-08-26 DIAGNOSIS — Z3200 Encounter for pregnancy test, result unknown: Secondary | ICD-10-CM | POA: Diagnosis not present

## 2017-08-28 DIAGNOSIS — Z3201 Encounter for pregnancy test, result positive: Secondary | ICD-10-CM | POA: Diagnosis not present

## 2017-08-28 DIAGNOSIS — Z3200 Encounter for pregnancy test, result unknown: Secondary | ICD-10-CM | POA: Diagnosis not present

## 2017-09-12 ENCOUNTER — Other Ambulatory Visit: Payer: Self-pay | Admitting: Family Medicine

## 2017-09-12 DIAGNOSIS — E039 Hypothyroidism, unspecified: Secondary | ICD-10-CM | POA: Diagnosis not present

## 2017-09-12 DIAGNOSIS — Z3200 Encounter for pregnancy test, result unknown: Secondary | ICD-10-CM | POA: Diagnosis not present

## 2017-09-12 NOTE — Telephone Encounter (Signed)
Patient was decreased to 150 mg at last OV in March 2018, but 300 mg was last filled in October after patient sent MyChart message requesting to increase dose. Called patient and left message to verify what dose she has been taking and what does she needs filled.

## 2017-09-14 ENCOUNTER — Other Ambulatory Visit: Payer: Self-pay | Admitting: Family Medicine

## 2017-09-18 ENCOUNTER — Encounter: Payer: Self-pay | Admitting: Family Medicine

## 2017-09-22 ENCOUNTER — Other Ambulatory Visit: Payer: Self-pay

## 2017-09-22 MED ORDER — BUPROPION HCL ER (XL) 300 MG PO TB24: 300.0000 mg | ORAL_TABLET | Freq: Every day | ORAL | 0 refills | Status: DC

## 2017-09-30 DIAGNOSIS — O0900 Supervision of pregnancy with history of infertility, unspecified trimester: Secondary | ICD-10-CM | POA: Diagnosis not present

## 2017-10-06 ENCOUNTER — Telehealth: Payer: Self-pay | Admitting: *Deleted

## 2017-10-06 NOTE — Telephone Encounter (Signed)
-----   Message from Romualdo BolkJill Evelyn Jertson, MD sent at 10/02/2017  6:11 PM EST ----- Can you please call and check on this patient, she is overdue for an annual exam and never did her DEXA.  Thanks, Noreene LarssonJill ----- Message ----- From: SYSTEM Sent: 07/04/2017  12:06 AM To: Romualdo BolkJill Evelyn Jertson, MD

## 2017-10-06 NOTE — Telephone Encounter (Signed)
Left message to call regarding scheduling AEX/ DEXA -eh

## 2017-10-14 DIAGNOSIS — Z113 Encounter for screening for infections with a predominantly sexual mode of transmission: Secondary | ICD-10-CM | POA: Diagnosis not present

## 2017-10-14 DIAGNOSIS — Z3A10 10 weeks gestation of pregnancy: Secondary | ICD-10-CM | POA: Diagnosis not present

## 2017-10-14 DIAGNOSIS — O99211 Obesity complicating pregnancy, first trimester: Secondary | ICD-10-CM | POA: Diagnosis not present

## 2017-10-14 DIAGNOSIS — O3680X0 Pregnancy with inconclusive fetal viability, not applicable or unspecified: Secondary | ICD-10-CM | POA: Diagnosis not present

## 2017-10-14 DIAGNOSIS — O09811 Supervision of pregnancy resulting from assisted reproductive technology, first trimester: Secondary | ICD-10-CM | POA: Diagnosis not present

## 2017-10-14 DIAGNOSIS — Z3689 Encounter for other specified antenatal screening: Secondary | ICD-10-CM | POA: Diagnosis not present

## 2017-10-14 DIAGNOSIS — O99341 Other mental disorders complicating pregnancy, first trimester: Secondary | ICD-10-CM | POA: Diagnosis not present

## 2017-10-14 LAB — OB RESULTS CONSOLE HEPATITIS B SURFACE ANTIGEN: Hepatitis B Surface Ag: NEGATIVE

## 2017-10-14 LAB — OB RESULTS CONSOLE ABO/RH: RH Type: POSITIVE

## 2017-10-14 LAB — OB RESULTS CONSOLE GC/CHLAMYDIA
Chlamydia: NEGATIVE
Gonorrhea: NEGATIVE

## 2017-10-14 LAB — OB RESULTS CONSOLE HIV ANTIBODY (ROUTINE TESTING): HIV: NONREACTIVE

## 2017-10-14 LAB — OB RESULTS CONSOLE RUBELLA ANTIBODY, IGM: Rubella: IMMUNE

## 2017-10-14 LAB — OB RESULTS CONSOLE RPR: RPR: NONREACTIVE

## 2017-10-14 NOTE — Telephone Encounter (Signed)
Spoke with patient and she is currently about [redacted] weeks pregnant. She will talk with her OBGYN regarding the DEXA -eh

## 2017-10-17 NOTE — Telephone Encounter (Signed)
Called patient to congratulate her, left a message (okay per DPR)

## 2017-10-24 DIAGNOSIS — O09511 Supervision of elderly primigravida, first trimester: Secondary | ICD-10-CM | POA: Diagnosis not present

## 2017-10-24 DIAGNOSIS — Z3682 Encounter for antenatal screening for nuchal translucency: Secondary | ICD-10-CM | POA: Diagnosis not present

## 2017-10-24 DIAGNOSIS — O99211 Obesity complicating pregnancy, first trimester: Secondary | ICD-10-CM | POA: Diagnosis not present

## 2017-10-24 DIAGNOSIS — O09819 Supervision of pregnancy resulting from assisted reproductive technology, unspecified trimester: Secondary | ICD-10-CM | POA: Diagnosis not present

## 2017-10-24 DIAGNOSIS — O09521 Supervision of elderly multigravida, first trimester: Secondary | ICD-10-CM | POA: Diagnosis not present

## 2017-10-24 DIAGNOSIS — Z3A12 12 weeks gestation of pregnancy: Secondary | ICD-10-CM | POA: Diagnosis not present

## 2017-11-22 ENCOUNTER — Encounter (HOSPITAL_COMMUNITY): Payer: Self-pay

## 2017-11-22 ENCOUNTER — Inpatient Hospital Stay (HOSPITAL_COMMUNITY)
Admission: AD | Admit: 2017-11-22 | Discharge: 2017-11-22 | Disposition: A | Discharge status: Home / Self Care | Payer: BLUE CROSS/BLUE SHIELD | Source: Ambulatory Visit | Attending: Obstetrics and Gynecology | Admitting: Obstetrics and Gynecology

## 2017-11-22 ENCOUNTER — Inpatient Hospital Stay (HOSPITAL_COMMUNITY): Payer: BLUE CROSS/BLUE SHIELD

## 2017-11-22 DIAGNOSIS — O26892 Other specified pregnancy related conditions, second trimester: Secondary | ICD-10-CM

## 2017-11-22 DIAGNOSIS — Z3A16 16 weeks gestation of pregnancy: Secondary | ICD-10-CM | POA: Diagnosis not present

## 2017-11-22 DIAGNOSIS — O4692 Antepartum hemorrhage, unspecified, second trimester: Secondary | ICD-10-CM

## 2017-11-22 DIAGNOSIS — R109 Unspecified abdominal pain: Secondary | ICD-10-CM | POA: Diagnosis not present

## 2017-11-22 DIAGNOSIS — Z87891 Personal history of nicotine dependence: Secondary | ICD-10-CM | POA: Diagnosis not present

## 2017-11-22 HISTORY — DX: Autoimmune thyroiditis: E06.3

## 2017-11-22 LAB — WET PREP, GENITAL
Clue Cells Wet Prep HPF POC: NONE SEEN
Sperm: NONE SEEN
Trich, Wet Prep: NONE SEEN
Yeast Wet Prep HPF POC: NONE SEEN

## 2017-11-22 LAB — URINALYSIS, ROUTINE W REFLEX MICROSCOPIC
Bilirubin Urine: NEGATIVE
Glucose, UA: NEGATIVE mg/dL
Hgb urine dipstick: NEGATIVE
Ketones, ur: NEGATIVE mg/dL
Leukocytes, UA: NEGATIVE
Nitrite: NEGATIVE
Protein, ur: NEGATIVE mg/dL
Specific Gravity, Urine: 1.014 (ref 1.005–1.030)
pH: 6 (ref 5.0–8.0)

## 2017-11-22 LAB — ABO/RH: ABO/RH(D): A POS

## 2017-11-22 LAB — CBC
HCT: 38.6 % (ref 36.0–46.0)
Hemoglobin: 13.6 g/dL (ref 12.0–15.0)
MCH: 30.8 pg (ref 26.0–34.0)
MCHC: 35.2 g/dL (ref 30.0–36.0)
MCV: 87.5 fL (ref 78.0–100.0)
Platelets: 222 10*3/uL (ref 150–400)
RBC: 4.41 MIL/uL (ref 3.87–5.11)
RDW: 12 % (ref 11.5–15.5)
WBC: 10.6 10*3/uL — ABNORMAL HIGH (ref 4.0–10.5)

## 2017-11-22 NOTE — MAU Provider Note (Signed)
Chief Complaint: Vaginal Bleeding   First Provider Initiated Contact with Patient 11/22/17 1314      SUBJECTIVE HPI: [name] is a 32 y.o. G2P0010 at 8828w3d by LMP who presents to maternity admissions reporting vaginal bleeding. She reports vaginal spotting since last night, first noticed bleeding when she was wiping in the restroom. She reports that bleeding was bright red at beginning but it currently "brown stringy discharge". She denies having intercourse in the last 24 hours. She denies abdominal pain or cramping.  She denies vaginal itching/burning, urinary symptoms, h/a, dizziness, n/v, or fever/chills.  [facility]- was seen for initial OB appointment and confirmed IUP.    Past Medical History:  Diagnosis Date  . Abnormal uterine bleeding   . Amenorrhea   . Anxiety   . Depression   . GERD (gastroesophageal reflux disease)   . Hashimoto's thyroiditis   . Hormone disorder   . Infertility, female   . PONV (postoperative nausea and vomiting)   . Premature ovarian failure 2002  . Premature ovarian failure 2002  . Psychogenic air hunger    Past Surgical History:  Procedure Laterality Date  . DILATATION & CURETTAGE/HYSTEROSCOPY WITH MYOSURE N/A 05/07/2016   Procedure: DILATATION & CURETTAGE/HYSTEROSCOPY WITH MYOSURE;  Surgeon: Romualdo BolkJill Evelyn Jertson, MD;  Location: [facility];  Service: Gynecology;  Laterality: N/A;  . TONSILLECTOMY    . TONSILLECTOMY AND ADENOIDECTOMY    . WISDOM TOOTH EXTRACTION     Social History   Socioeconomic History  . Marital status: Married    Spouse name: Not on file  . Number of children: Not on file  . Years of education: Not on file  . Highest education level: Not on file  Social Needs  . Financial resource strain: Not on file  . Food insecurity - worry: Not on file  . Food insecurity - inability: Not on file  . Transportation needs - medical: Not on file  . Transportation needs - non-medical: Not on file   Occupational History  . 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 Smoking status: Former Smoker    Packs/day: 0.10    Years: 1.00    Pack years: 0.10  . Smokeless tobacco: Never Used  . Tobacco comment: has not smoked last 2 days  Substance and Sexual Activity  . Alcohol use: No    Alcohol/week: 0.0 - 0.6 oz    Frequency: Never    Comment: occasional  . Drug use: No  . Sexual activity: Yes    Partners: Male    Birth control/protection: None    Comment: has premature ovarian failure  Other Topics Concern  . Not on file  Social History Narrative  . Not on file   No current facility-administered medications on file prior to encounter.    Current Outpatient Medications on File Prior to Encounter  Medication Sig Dispense Refill  . aspirin EC 81 MG tablet Take 81 mg by mouth daily.    Marland Kitchen. buPROPion (WELLBUTRIN XL) 300 MG 24 hr tablet Take 1 tablet (300 mg total) by mouth daily. 90 tablet 0  . calcium carbonate (TUMS - DOSED IN MG ELEMENTAL CALCIUM) 500 MG chewable tablet Chew 1 tablet by mouth daily as needed for indigestion or heartburn.    Marland Kitchen. EPINEPHrine (EPIPEN) 0.3 mg/0.3 mL DEVI Inject 0.3 mLs (0.3 mg total) into the muscle as needed. 2 Device 1  . estradiol (ESTRACE) 2 MG  tablet Take 2 mg by mouth 2 (two) times daily.  1  . ibuprofen (ADVIL,MOTRIN) 200 MG tablet Take 400 mg by mouth every 6 (six) hours as needed for mild pain.    Marland Kitchen levothyroxine (SYNTHROID, LEVOTHROID) 50 MCG tablet Take 50 mcg by mouth daily.  1  . Prenatal Vit-Fe Fumarate-FA (PRENATAL MULTIVITAMIN) TABS tablet Take 1 tablet by mouth daily at 12 noon.    Marland Kitchen PROGESTERONE IM Inject 1 mL into the muscle daily.     Allergies  Allergen Reactions  . Bee Venom Hives  . Penicillins Hives    Has patient had a PCN reaction causing immediate rash, facial/tongue/throat swelling, SOB or lightheadedness with hypotension: unknown Has patient had a PCN reaction causing severe rash involving mucus  membranes or skin necrosis: unknown Has patient had a PCN reaction that required hospitalization no Has patient had a PCN reaction occurring within the last 10 years: no If all of the above answers are "NO", then may proceed with Cephalosporin use.     ROS:  Review of Systems  Constitutional: Negative.   Respiratory: Negative.   Cardiovascular: Negative.   Gastrointestinal: Negative for abdominal pain, constipation, diarrhea, nausea and vomiting.  Genitourinary: Positive for vaginal bleeding. Negative for difficulty urinating, dysuria, frequency, pelvic pain, urgency and vaginal pain.  Musculoskeletal: Negative.   Neurological: Negative.   Psychiatric/Behavioral: Negative.    I have reviewed patient's Past Medical Hx, Surgical Hx, Family Hx, Social Hx, medications and allergies.   Physical Exam   Patient Vitals for the past 24 hrs:  BP Temp Temp src Pulse Resp Height Weight  11/22/17 1442 118/78 - - - - - -  11/22/17 1325 118/79 - - (!) 114 - - -  11/22/17 1300 (!) 128/92 98.7 F (37.1 C) Oral (!) 115 18 - -  11/22/17 1239 - - - - - 5\' 1"  (1.549 m) 269 lb 12 oz (122.4 kg)   Constitutional: Well-developed, obese female in no acute distress.  Cardiovascular: normal rate Respiratory: normal effort GI: Abd soft, non-tender. Pos BS x 4 MS: Extremities nontender, no edema, normal ROM Neurologic: Alert and oriented x 4.  GU: Neg CVAT.  PELVIC EXAM: Cervix pink, visually closed, without lesion, scant brown mucus discharge, no vaginal bleeding noted from cervix, no old blood noted in vagina, vaginal walls and external genitalia normal Bimanual exam: Cervix 0/long/high, firm, anterior, neg CMT, uterus nontender, nonenlarged, adnexa without tenderness, enlargement, or mass  LAB RESULTS Results for orders placed or performed during the hospital encounter of 11/22/17 (from the past 24 hour(s))  Urinalysis, Routine w reflex microscopic     Status: None   Collection Time: 11/22/17 12:36  PM  Result Value Ref Range   Color, Urine YELLOW YELLOW   APPearance CLEAR CLEAR   Specific Gravity, Urine 1.014 1.005 - 1.030   pH 6.0 5.0 - 8.0   Glucose, UA NEGATIVE NEGATIVE mg/dL   Hgb urine dipstick NEGATIVE NEGATIVE   Bilirubin Urine NEGATIVE NEGATIVE   Ketones, ur NEGATIVE NEGATIVE mg/dL   Protein, ur NEGATIVE NEGATIVE mg/dL   Nitrite NEGATIVE NEGATIVE   Leukocytes, UA NEGATIVE NEGATIVE  Wet prep, genital     Status: Abnormal   Collection Time: 11/22/17  1:20 PM  Result Value Ref Range   Yeast Wet Prep HPF POC NONE SEEN NONE SEEN   Trich, Wet Prep NONE SEEN NONE SEEN   Clue Cells Wet Prep HPF POC NONE SEEN NONE SEEN   WBC, Wet Prep HPF POC FEW (A)  NONE SEEN   Sperm NONE SEEN   ABO/Rh     Status: None (Preliminary result)   Collection Time: 11/22/17  1:44 PM  Result Value Ref Range   ABO/RH(D)      A POS Performed at [facility], [address] [place], [place] [address]   CBC     Status: Abnormal   Collection Time: 11/22/17  1:44 PM  Result Value Ref Range   WBC 10.6 (H) 4.0 - 10.5 K/uL   RBC 4.41 3.87 - 5.11 MIL/uL   Hemoglobin 13.6 12.0 - 15.0 g/dL   HCT 78.2 95.6 - 21.3 %   MCV 87.5 78.0 - 100.0 fL   MCH 30.8 26.0 - 34.0 pg   MCHC 35.2 30.0 - 36.0 g/dL   RDW 08.6 57.8 - 46.9 %   Platelets 222 150 - 400 K/uL    --/--/A POS Performed at [facility], [address], [place], [place] [address]  (03/16 1344)  IMAGING FHR 164 Placenta: anterior, above cervical os  AFV: WNL CL: 3.6cm No signs of bleeding   MAU Management/MDM: Orders Placed This Encounter  Procedures  . Wet prep, genital  . [place] MFM OB LIMITED  . Urinalysis, Routine w reflex microscopic  . CBC  . ABO/Rh  Wet prep- negative UA- negative CBC- WNL  Consult Dr Senaida Ores with assessment and [place] results. Dr Senaida Ores states patient reported stimulation but no penetration prior to vaginal bleeding episode. Okay to discharge home with follow up as scheduled.      Pt discharged  with strict bleeding precautions and pelvic rest.  ASSESSMENT 1. Vaginal bleeding in pregnancy, second trimester   2. Abdominal pain during pregnancy in second trimester     PLAN Discharge home Follow up as scheduled in the office  Return to MAU as needed for emergencies  Pelvic rest   Follow-up Information    [facility] Follow up.   Why:  Follow up as scheduled for prenatal visits  Contact information: [address]  [address] [place] [address] [phone]           Allergies as of 11/22/2017      Reactions   Bee Venom Hives   Penicillins Hives   Has patient had a PCN reaction causing immediate rash, facial/tongue/throat swelling, SOB or lightheadedness with hypotension: unknown Has patient had a PCN reaction causing severe rash involving mucus membranes or skin necrosis: unknown Has patient had a PCN reaction that required hospitalization no Has patient had a PCN reaction occurring within the last 10 years: no If all of the above answers are "NO", then may proceed with Cephalosporin use.      Medication List    TAKE these medications   aspirin EC 81 MG tablet Take 81 mg by mouth daily.   buPROPion 300 MG 24 hr tablet Commonly known as:  WELLBUTRIN XL Take 1 tablet (300 mg total) by mouth daily.   calcium carbonate 500 MG chewable tablet Commonly known as:  TUMS - dosed in mg elemental calcium Chew 1 tablet by mouth daily as needed for indigestion or heartburn.   EPINEPHrine 0.3 mg/0.3 mL Devi Commonly known as:  EPIPEN Inject 0.3 mLs (0.3 mg total) into the muscle as needed.   estradiol 2 MG tablet Commonly known as:  ESTRACE Take 2 mg by mouth 2 (two) times daily.   ibuprofen 200 MG tablet Commonly known as:  ADVIL,MOTRIN Take 400 mg by mouth every 6 (six) hours as needed  for mild pain.   levothyroxine 50 MCG tablet Commonly known as:  SYNTHROID, LEVOTHROID Take 50 mcg by mouth daily.   prenatal multivitamin Tabs  tablet Take 1 tablet by mouth daily at 12 noon.   PROGESTERONE IM Inject 1 mL into the muscle daily.       Steward Drone  Certified Nurse-Midwife 11/22/2017  2:46 PM

## 2017-11-22 NOTE — MAU Note (Signed)
Spotting since last night, noticed when wiping in bathroom. Had some brown/stringy discharge. Some cramps

## 2017-11-24 LAB — GC/CHLAMYDIA PROBE AMP (~~LOC~~) NOT AT ARMC
Chlamydia: NEGATIVE
Neisseria Gonorrhea: NEGATIVE

## 2017-12-04 DIAGNOSIS — O99212 Obesity complicating pregnancy, second trimester: Secondary | ICD-10-CM | POA: Diagnosis not present

## 2017-12-04 DIAGNOSIS — O200 Threatened abortion: Secondary | ICD-10-CM | POA: Diagnosis not present

## 2017-12-04 DIAGNOSIS — Z3A18 18 weeks gestation of pregnancy: Secondary | ICD-10-CM | POA: Diagnosis not present

## 2017-12-04 DIAGNOSIS — Z363 Encounter for antenatal screening for malformations: Secondary | ICD-10-CM | POA: Diagnosis not present

## 2017-12-08 ENCOUNTER — Other Ambulatory Visit (HOSPITAL_COMMUNITY): Payer: Self-pay | Admitting: Obstetrics and Gynecology

## 2017-12-08 DIAGNOSIS — E669 Obesity, unspecified: Secondary | ICD-10-CM

## 2017-12-08 DIAGNOSIS — Z3A19 19 weeks gestation of pregnancy: Secondary | ICD-10-CM

## 2017-12-08 DIAGNOSIS — Z3689 Encounter for other specified antenatal screening: Secondary | ICD-10-CM

## 2017-12-18 ENCOUNTER — Ambulatory Visit (INDEPENDENT_AMBULATORY_CARE_PROVIDER_SITE_OTHER): Payer: BLUE CROSS/BLUE SHIELD | Admitting: Adult Health

## 2017-12-18 ENCOUNTER — Encounter: Payer: Self-pay | Admitting: Adult Health

## 2017-12-18 VITALS — BP 124/84 | HR 122 | Temp 98.0°F | Wt 273.2 lb

## 2017-12-18 DIAGNOSIS — J029 Acute pharyngitis, unspecified: Secondary | ICD-10-CM

## 2017-12-18 MED ORDER — AMOXICILLIN 500 MG PO CAPS: 500.0000 mg | ORAL_CAPSULE | Freq: Two times a day (BID) | ORAL | 0 refills | Status: AC

## 2017-12-18 NOTE — Progress Notes (Signed)
Subjective:    Patient ID: Jessica Silva, female    DOB: 07/22/1986, 32 y.o.   MRN: [id number]  HPI 32 year old female who  has a past medical history of Abnormal uterine bleeding, Amenorrhea, Anxiety, Depression, GERD (gastroesophageal reflux disease), Hashimoto's thyroiditis, Hormone disorder, Infertility, female, PONV (postoperative nausea and vomiting), Premature ovarian failure (2002), Premature ovarian failure (2002), and Psychogenic air hunger.  She presents to the office today for sore throat. She reports that her husband was diagnosed with strep throat and an ear infection today. She started to develop a sore throat and had trouble swallowing this afternoon. She is pregnant and spoke to her GYN who advised to get checked out.    She denies any fevers  Review of Systems See HPI   Past Medical History:  Diagnosis Date  . Abnormal uterine bleeding   . Amenorrhea   . Anxiety   . Depression   . GERD (gastroesophageal reflux disease)   . Hashimoto's thyroiditis   . Hormone disorder   . Infertility, female   . PONV (postoperative nausea and vomiting)   . Premature ovarian failure 2002  . Premature ovarian failure 2002  . Psychogenic air hunger     Social History   Socioeconomic History  . Marital status: Married    Spouse name: Not on file  . Number of children: Not on file  . Years of education: Not on file  . Highest education level: Not on file  Occupational History  . Occupation: lake Psychologist, forensic: [employer]  Social Needs  . Financial resource strain: Not on file  . Food insecurity:    Worry: Not on file    Inability: Not on file  . Transportation needs:    Medical: Not on file    Non-medical: Not on file  Tobacco Use  . Smoking status: Former Smoker    Packs/day: 0.10    Years: 1.00    Pack years: 0.10  . Smokeless tobacco: Never Used  . Tobacco comment: has not smoked last 2 days  Substance and Sexual Activity  . Alcohol use: No   Alcohol/week: 0.0 - 0.6 oz    Frequency: Never    Comment: occasional  . Drug use: No  . Sexual activity: Yes    Partners: Male    Birth control/protection: None    Comment: has premature ovarian failure  Lifestyle  . Physical activity:    Days per week: Not on file    Minutes per session: Not on file  . Stress: Not on file  Relationships  . Social connections:    Talks on phone: Not on file    Gets together: Not on file    Attends religious service: Not on file    Active member of club or organization: Not on file    Attends meetings of clubs or organizations: Not on file    Relationship status: Not on file  . Intimate partner violence:    Fear of current or ex partner: Not on file    Emotionally abused: Not on file    Physically abused: Not on file    Forced sexual activity: Not on file  Other Topics Concern  . Not on file  Social History Narrative  . Not on file    Past Surgical History:  Procedure Laterality Date  . DILATATION & CURETTAGE/HYSTEROSCOPY WITH MYOSURE N/A 05/07/2016   Procedure: DILATATION & CURETTAGE/HYSTEROSCOPY WITH MYOSURE;  Surgeon: Romualdo Bolk, MD;  Location: [facility];  Service: Gynecology;  Laterality: N/A;  . TONSILLECTOMY    . TONSILLECTOMY AND ADENOIDECTOMY    . WISDOM TOOTH EXTRACTION      Family History  Problem Relation Age of Onset  . Hiatal hernia Mother   . Hypertension Sister   . Polycystic ovary syndrome Sister   . Irritable bowel syndrome Brother   . Hiatal hernia Maternal Grandmother   . Irritable bowel syndrome Brother     Allergies  Allergen Reactions  . Bee Venom Hives  . Penicillins Hives    Has patient had a PCN reaction causing immediate rash, facial/tongue/throat swelling, SOB or lightheadedness with hypotension: unknown Has patient had a PCN reaction causing severe rash involving mucus membranes or skin necrosis: unknown Has patient had a PCN reaction that required hospitalization no Has patient had a PCN  reaction occurring within the last 10 years: no If all of the above answers are "NO", then may proceed with Cephalosporin use.     Current Outpatient Medications on File Prior to Visit  Medication Sig Dispense Refill  . buPROPion (WELLBUTRIN XL) 300 MG 24 hr tablet Take 1 tablet (300 mg total) by mouth daily. 90 tablet 0  . calcium carbonate (TUMS - DOSED IN MG ELEMENTAL CALCIUM) 500 MG chewable tablet Chew 1 tablet by mouth daily as needed for indigestion or heartburn.    Marland Kitchen EPINEPHrine (EPIPEN) 0.3 mg/0.3 mL DEVI Inject 0.3 mLs (0.3 mg total) into the muscle as needed. 2 Device 1  . ibuprofen (ADVIL,MOTRIN) 200 MG tablet Take 400 mg by mouth every 6 (six) hours as needed for mild pain.    Marland Kitchen levothyroxine (SYNTHROID, LEVOTHROID) 50 MCG tablet Take 50 mcg by mouth daily.  1  . Prenatal Vit-Fe Fumarate-FA (PRENATAL MULTIVITAMIN) TABS tablet Take 1 tablet by mouth daily at 12 noon.    Marland Kitchen aspirin EC 81 MG tablet Take 81 mg by mouth daily.    Marland Kitchen estradiol (ESTRACE) 2 MG tablet Take 2 mg by mouth 2 (two) times daily.  1  . PROGESTERONE IM Inject 1 mL into the muscle daily.     No current facility-administered medications on file prior to visit.     BP 124/84 (BP Location: Left Arm, Patient Position: Sitting, Cuff Size: Large)   Pulse (!) 122   Temp 98 F (36.7 C) (Oral)   Wt 273 lb 3.2 oz (123.9 kg)   SpO2 98%   BMI 51.62 kg/m       Objective:   Physical Exam  Constitutional: She is oriented to person, place, and time. She appears well-developed and well-nourished. No distress.  HENT:  Right Ear: Hearing, tympanic membrane, external ear and ear canal normal.  Left Ear: Hearing, tympanic membrane, external ear and ear canal normal.  Nose: Nose normal.  Mouth/Throat: Uvula is midline. Posterior oropharyngeal erythema (mild ) present. No oropharyngeal exudate, posterior oropharyngeal edema or tonsillar abscesses.  Cardiovascular: Normal rate, regular rhythm, normal heart sounds and intact  distal pulses. Exam reveals no gallop and no friction rub.  No murmur heard. Pulmonary/Chest: Effort normal and breath sounds normal. No respiratory distress. She has no wheezes. She has no rales. She exhibits no tenderness.  Neurological: She is alert and oriented to person, place, and time.  Skin: Skin is warm and dry. No rash noted. She is not diaphoretic. No erythema. No pallor.  Psychiatric: She has a normal mood and affect. Her behavior is normal. Judgment and thought content normal.  Nursing note and vitals  reviewed.     Assessment & Plan:  1. Sore throat - POC Rapid Strep A- negative  - Likely too early to get accurate results. Due to pregnancy and her husband being diagnosed. Will treat with Amoxicillin 500 mg BID x 10 days.  - She does have an allergy to PCN. She reports taking Amoxicillin in the past without incident.  - Warm salt water gargles, tea and honey for symptom relief.  - Follow up as needed  Shirline Freesory Denessa Cavan, NP

## 2017-12-19 ENCOUNTER — Other Ambulatory Visit: Payer: Self-pay | Admitting: Family Medicine

## 2017-12-19 ENCOUNTER — Encounter: Payer: Self-pay | Admitting: Adult Health

## 2017-12-19 LAB — POCT RAPID STREP A (OFFICE): Rapid Strep A Screen: NEGATIVE

## 2017-12-26 ENCOUNTER — Other Ambulatory Visit (HOSPITAL_COMMUNITY): Payer: Self-pay | Admitting: Obstetrics and Gynecology

## 2017-12-26 ENCOUNTER — Ambulatory Visit (HOSPITAL_COMMUNITY)
Admission: RE | Admit: 2017-12-26 | Discharge: 2017-12-26 | Disposition: A | Discharge status: Home / Self Care | Payer: BLUE CROSS/BLUE SHIELD | Source: Ambulatory Visit | Attending: Obstetrics and Gynecology | Admitting: Obstetrics and Gynecology

## 2017-12-26 DIAGNOSIS — O09812 Supervision of pregnancy resulting from assisted reproductive technology, second trimester: Secondary | ICD-10-CM | POA: Diagnosis not present

## 2017-12-26 DIAGNOSIS — Z3A21 21 weeks gestation of pregnancy: Secondary | ICD-10-CM | POA: Diagnosis not present

## 2017-12-26 DIAGNOSIS — E079 Disorder of thyroid, unspecified: Secondary | ICD-10-CM | POA: Diagnosis not present

## 2017-12-26 DIAGNOSIS — O99282 Endocrine, nutritional and metabolic diseases complicating pregnancy, second trimester: Secondary | ICD-10-CM | POA: Insufficient documentation

## 2017-12-26 DIAGNOSIS — Z3689 Encounter for other specified antenatal screening: Secondary | ICD-10-CM

## 2017-12-26 DIAGNOSIS — O99212 Obesity complicating pregnancy, second trimester: Secondary | ICD-10-CM | POA: Diagnosis not present

## 2017-12-26 DIAGNOSIS — Z363 Encounter for antenatal screening for malformations: Secondary | ICD-10-CM | POA: Insufficient documentation

## 2017-12-26 DIAGNOSIS — Z3A19 19 weeks gestation of pregnancy: Secondary | ICD-10-CM

## 2017-12-26 DIAGNOSIS — E669 Obesity, unspecified: Secondary | ICD-10-CM

## 2018-01-01 ENCOUNTER — Other Ambulatory Visit (HOSPITAL_COMMUNITY): Payer: Self-pay | Admitting: *Deleted

## 2018-01-02 ENCOUNTER — Other Ambulatory Visit (HOSPITAL_COMMUNITY): Payer: Self-pay | Admitting: *Deleted

## 2018-01-02 DIAGNOSIS — E079 Disorder of thyroid, unspecified: Secondary | ICD-10-CM

## 2018-01-02 DIAGNOSIS — O99282 Endocrine, nutritional and metabolic diseases complicating pregnancy, second trimester: Secondary | ICD-10-CM

## 2018-01-14 ENCOUNTER — Encounter: Payer: Self-pay | Admitting: Family Medicine

## 2018-01-14 ENCOUNTER — Ambulatory Visit (INDEPENDENT_AMBULATORY_CARE_PROVIDER_SITE_OTHER): Payer: Self-pay | Admitting: Family Medicine

## 2018-01-14 VITALS — BP 110/80 | HR 105 | Temp 98.3°F | Wt 273.7 lb

## 2018-01-14 DIAGNOSIS — F32A Depression, unspecified: Secondary | ICD-10-CM

## 2018-01-14 DIAGNOSIS — F329 Major depressive disorder, single episode, unspecified: Secondary | ICD-10-CM

## 2018-01-14 MED ORDER — BUPROPION HCL ER (XL) 150 MG PO TB24: 150.0000 mg | ORAL_TABLET | Freq: Every day | ORAL | 3 refills | Status: DC

## 2018-01-14 NOTE — Progress Notes (Signed)
  Subjective:     Patient ID: Jessica Silva, female   DOB: 18-Dec-1985, 32 y.o.   MRN: [id number]  HPI Patient has history of depression and is seen for follow-up requesting refill of Wellbutrin. She's actually [redacted] weeks pregnant. She at a time of her pregnancy tapered over 300 mg 250 mg and feels her depression and anxiety symptoms are stable at that dose. She had had discussions with her OB/GYN getting their permission to remain on Wellbutrin. She also has hypothyroidism and that is being monitored by OB/GYN. Patient feels well his time. No major depressive symptoms.  We did discuss cautions with lactation with Wellbutrin with potential lower seizure threshold with infants.  She is still contemplating tapering off Wellbutrin completely  Past Medical History:  Diagnosis Date  . Abnormal uterine bleeding   . Amenorrhea   . Anxiety   . Depression   . GERD (gastroesophageal reflux disease)   . Hashimoto's thyroiditis   . Hormone disorder   . Infertility, female   . PONV (postoperative nausea and vomiting)   . Premature ovarian failure 2002  . Premature ovarian failure 2002  . Psychogenic air hunger    Past Surgical History:  Procedure Laterality Date  . DILATATION & CURETTAGE/HYSTEROSCOPY WITH MYOSURE N/A 05/07/2016   Procedure: DILATATION & CURETTAGE/HYSTEROSCOPY WITH MYOSURE;  Surgeon: Romualdo Bolk, MD;  Location: [facility];  Service: Gynecology;  Laterality: N/A;  . TONSILLECTOMY    . TONSILLECTOMY AND ADENOIDECTOMY    . WISDOM TOOTH EXTRACTION      reports that she has quit smoking. She has a 0.10 pack-year smoking history. She has never used smokeless tobacco. She reports that she does not drink alcohol or use drugs. family history includes Hiatal hernia in her maternal grandmother and mother; Hypertension in her sister; Irritable bowel syndrome in her brother and brother; Polycystic ovary syndrome in her sister. Allergies  Allergen Reactions  . Bee Venom Hives  . Penicillins  Hives    Has patient had a PCN reaction causing immediate rash, facial/tongue/throat swelling, SOB or lightheadedness with hypotension: unknown Has patient had a PCN reaction causing severe rash involving mucus membranes or skin necrosis: unknown Has patient had a PCN reaction that required hospitalization no Has patient had a PCN reaction occurring within the last 10 years: no If all of the above answers are "NO", then may proceed with Cephalosporin use.      Review of Systems  Constitutional: Negative for appetite change and unexpected weight change.  Psychiatric/Behavioral: Negative for dysphoric mood, sleep disturbance and suicidal ideas. The patient is not nervous/anxious.        Objective:   Physical Exam  Constitutional: She appears well-developed and well-nourished.  Cardiovascular: Normal rate and regular rhythm.  Pulmonary/Chest: Effort normal and breath sounds normal. She has no wheezes. She has no rales.  Psychiatric: She has a normal mood and affect. Her behavior is normal.       Assessment:     History of major depression currently stable on low-dose Wellbutrin    Plan:     -Refilled Wellbutrin XL 150 mg once daily. Suggest she may discuss with her pediatrician pros and cons regarding Wellbutrin while lactating  Kristian Covey MD [facility]

## 2018-01-23 ENCOUNTER — Other Ambulatory Visit (HOSPITAL_COMMUNITY): Payer: Self-pay | Admitting: Maternal and Fetal Medicine

## 2018-01-23 ENCOUNTER — Encounter (HOSPITAL_COMMUNITY): Payer: Self-pay

## 2018-01-23 ENCOUNTER — Ambulatory Visit (HOSPITAL_COMMUNITY)
Admission: RE | Admit: 2018-01-23 | Discharge: 2018-01-23 | Disposition: A | Discharge status: Home / Self Care | Payer: BLUE CROSS/BLUE SHIELD | Source: Ambulatory Visit | Attending: Obstetrics and Gynecology | Admitting: Obstetrics and Gynecology

## 2018-01-23 DIAGNOSIS — O09812 Supervision of pregnancy resulting from assisted reproductive technology, second trimester: Secondary | ICD-10-CM | POA: Diagnosis not present

## 2018-01-23 DIAGNOSIS — Z3A25 25 weeks gestation of pregnancy: Secondary | ICD-10-CM | POA: Diagnosis not present

## 2018-01-23 DIAGNOSIS — Z362 Encounter for other antenatal screening follow-up: Secondary | ICD-10-CM

## 2018-01-23 DIAGNOSIS — O99212 Obesity complicating pregnancy, second trimester: Secondary | ICD-10-CM | POA: Insufficient documentation

## 2018-01-23 DIAGNOSIS — O99282 Endocrine, nutritional and metabolic diseases complicating pregnancy, second trimester: Secondary | ICD-10-CM | POA: Insufficient documentation

## 2018-01-23 DIAGNOSIS — Z136 Encounter for screening for cardiovascular disorders: Secondary | ICD-10-CM | POA: Insufficient documentation

## 2018-01-23 DIAGNOSIS — E079 Disorder of thyroid, unspecified: Secondary | ICD-10-CM | POA: Diagnosis not present

## 2018-01-26 ENCOUNTER — Other Ambulatory Visit (HOSPITAL_COMMUNITY): Payer: Self-pay | Admitting: *Deleted

## 2018-01-26 DIAGNOSIS — E039 Hypothyroidism, unspecified: Secondary | ICD-10-CM

## 2018-02-05 DIAGNOSIS — Z3689 Encounter for other specified antenatal screening: Secondary | ICD-10-CM | POA: Diagnosis not present

## 2018-02-05 DIAGNOSIS — Z23 Encounter for immunization: Secondary | ICD-10-CM | POA: Diagnosis not present

## 2018-02-27 ENCOUNTER — Encounter (HOSPITAL_COMMUNITY): Payer: Self-pay

## 2018-02-27 ENCOUNTER — Ambulatory Visit (HOSPITAL_COMMUNITY)
Admission: RE | Admit: 2018-02-27 | Discharge: 2018-02-27 | Disposition: A | Discharge status: Home / Self Care | Payer: BLUE CROSS/BLUE SHIELD | Source: Ambulatory Visit | Attending: Obstetrics and Gynecology | Admitting: Obstetrics and Gynecology

## 2018-02-27 DIAGNOSIS — Z3A30 30 weeks gestation of pregnancy: Secondary | ICD-10-CM | POA: Insufficient documentation

## 2018-02-27 DIAGNOSIS — Z8489 Family history of other specified conditions: Secondary | ICD-10-CM | POA: Diagnosis not present

## 2018-02-27 DIAGNOSIS — O99212 Obesity complicating pregnancy, second trimester: Secondary | ICD-10-CM | POA: Diagnosis not present

## 2018-02-27 DIAGNOSIS — O09813 Supervision of pregnancy resulting from assisted reproductive technology, third trimester: Secondary | ICD-10-CM | POA: Insufficient documentation

## 2018-02-27 DIAGNOSIS — E039 Hypothyroidism, unspecified: Secondary | ICD-10-CM

## 2018-02-27 DIAGNOSIS — E079 Disorder of thyroid, unspecified: Secondary | ICD-10-CM | POA: Insufficient documentation

## 2018-02-27 DIAGNOSIS — Z362 Encounter for other antenatal screening follow-up: Secondary | ICD-10-CM | POA: Insufficient documentation

## 2018-02-27 DIAGNOSIS — O99283 Endocrine, nutritional and metabolic diseases complicating pregnancy, third trimester: Secondary | ICD-10-CM | POA: Insufficient documentation

## 2018-02-27 DIAGNOSIS — Z3A29 29 weeks gestation of pregnancy: Secondary | ICD-10-CM | POA: Diagnosis not present

## 2018-03-16 DIAGNOSIS — R51 Headache: Secondary | ICD-10-CM | POA: Diagnosis not present

## 2018-03-16 DIAGNOSIS — Z3A32 32 weeks gestation of pregnancy: Secondary | ICD-10-CM | POA: Diagnosis not present

## 2018-03-19 ENCOUNTER — Encounter (HOSPITAL_COMMUNITY): Payer: Self-pay

## 2018-03-20 ENCOUNTER — Ambulatory Visit (HOSPITAL_COMMUNITY)
Admission: RE | Admit: 2018-03-20 | Discharge: 2018-03-20 | Disposition: A | Discharge status: Home / Self Care | Payer: BLUE CROSS/BLUE SHIELD | Source: Ambulatory Visit | Attending: Obstetrics and Gynecology | Admitting: Obstetrics and Gynecology

## 2018-03-20 ENCOUNTER — Encounter (HOSPITAL_COMMUNITY): Payer: Self-pay

## 2018-03-20 DIAGNOSIS — O99283 Endocrine, nutritional and metabolic diseases complicating pregnancy, third trimester: Secondary | ICD-10-CM | POA: Diagnosis not present

## 2018-03-20 DIAGNOSIS — E039 Hypothyroidism, unspecified: Secondary | ICD-10-CM

## 2018-03-20 DIAGNOSIS — O09813 Supervision of pregnancy resulting from assisted reproductive technology, third trimester: Secondary | ICD-10-CM | POA: Diagnosis not present

## 2018-03-20 DIAGNOSIS — Z3A33 33 weeks gestation of pregnancy: Secondary | ICD-10-CM

## 2018-03-20 DIAGNOSIS — O99280 Endocrine, nutritional and metabolic diseases complicating pregnancy, unspecified trimester: Secondary | ICD-10-CM | POA: Diagnosis not present

## 2018-03-20 DIAGNOSIS — Z362 Encounter for other antenatal screening follow-up: Secondary | ICD-10-CM

## 2018-03-20 DIAGNOSIS — O99212 Obesity complicating pregnancy, second trimester: Secondary | ICD-10-CM

## 2018-03-23 ENCOUNTER — Other Ambulatory Visit (HOSPITAL_COMMUNITY): Payer: Self-pay | Admitting: *Deleted

## 2018-03-23 DIAGNOSIS — E063 Autoimmune thyroiditis: Secondary | ICD-10-CM

## 2018-03-25 ENCOUNTER — Other Ambulatory Visit: Payer: Self-pay | Admitting: Family Medicine

## 2018-03-25 NOTE — Telephone Encounter (Signed)
This is Dr Mar Daringburchettes pt

## 2018-03-25 NOTE — Telephone Encounter (Signed)
Do you want to refill this for her?

## 2018-03-26 ENCOUNTER — Other Ambulatory Visit: Payer: Self-pay | Admitting: Family Medicine

## 2018-03-26 DIAGNOSIS — O139 Gestational [pregnancy-induced] hypertension without significant proteinuria, unspecified trimester: Secondary | ICD-10-CM | POA: Diagnosis not present

## 2018-03-26 NOTE — Telephone Encounter (Signed)
Decline.  We have not prescribed this.  Should this request be directed to her GYN?

## 2018-04-02 DIAGNOSIS — Z3685 Encounter for antenatal screening for Streptococcus B: Secondary | ICD-10-CM | POA: Diagnosis not present

## 2018-04-02 LAB — OB RESULTS CONSOLE GBS: GBS: NEGATIVE

## 2018-04-15 ENCOUNTER — Other Ambulatory Visit: Payer: Self-pay | Admitting: Family Medicine

## 2018-04-17 ENCOUNTER — Encounter (HOSPITAL_COMMUNITY): Payer: Self-pay

## 2018-04-17 ENCOUNTER — Ambulatory Visit (HOSPITAL_COMMUNITY)
Admission: RE | Admit: 2018-04-17 | Discharge: 2018-04-17 | Disposition: A | Discharge status: Home / Self Care | Payer: BLUE CROSS/BLUE SHIELD | Source: Ambulatory Visit | Attending: Obstetrics and Gynecology | Admitting: Obstetrics and Gynecology

## 2018-04-17 ENCOUNTER — Other Ambulatory Visit: Payer: Self-pay

## 2018-04-17 ENCOUNTER — Inpatient Hospital Stay (HOSPITAL_COMMUNITY)
Admission: AD | Admit: 2018-04-17 | Discharge: 2018-04-22 | DRG: 786 | Disposition: A | Discharge status: Home / Self Care | Payer: BLUE CROSS/BLUE SHIELD | Attending: Obstetrics and Gynecology | Admitting: Obstetrics and Gynecology

## 2018-04-17 DIAGNOSIS — F419 Anxiety disorder, unspecified: Secondary | ICD-10-CM | POA: Diagnosis present

## 2018-04-17 DIAGNOSIS — Z3A37 37 weeks gestation of pregnancy: Secondary | ICD-10-CM | POA: Diagnosis not present

## 2018-04-17 DIAGNOSIS — Z880 Allergy status to penicillin: Secondary | ICD-10-CM

## 2018-04-17 DIAGNOSIS — Z87891 Personal history of nicotine dependence: Secondary | ICD-10-CM | POA: Diagnosis not present

## 2018-04-17 DIAGNOSIS — O411230 Chorioamnionitis, third trimester, not applicable or unspecified: Secondary | ICD-10-CM | POA: Diagnosis not present

## 2018-04-17 DIAGNOSIS — O324XX0 Maternal care for high head at term, not applicable or unspecified: Secondary | ICD-10-CM | POA: Diagnosis present

## 2018-04-17 DIAGNOSIS — O99284 Endocrine, nutritional and metabolic diseases complicating childbirth: Secondary | ICD-10-CM | POA: Diagnosis present

## 2018-04-17 DIAGNOSIS — O134 Gestational [pregnancy-induced] hypertension without significant proteinuria, complicating childbirth: Secondary | ICD-10-CM | POA: Diagnosis not present

## 2018-04-17 DIAGNOSIS — Z23 Encounter for immunization: Secondary | ICD-10-CM | POA: Diagnosis not present

## 2018-04-17 DIAGNOSIS — O99344 Other mental disorders complicating childbirth: Secondary | ICD-10-CM | POA: Diagnosis present

## 2018-04-17 DIAGNOSIS — F329 Major depressive disorder, single episode, unspecified: Secondary | ICD-10-CM | POA: Diagnosis present

## 2018-04-17 DIAGNOSIS — O99214 Obesity complicating childbirth: Secondary | ICD-10-CM | POA: Diagnosis present

## 2018-04-17 DIAGNOSIS — O9962 Diseases of the digestive system complicating childbirth: Secondary | ICD-10-CM | POA: Diagnosis present

## 2018-04-17 DIAGNOSIS — K219 Gastro-esophageal reflux disease without esophagitis: Secondary | ICD-10-CM | POA: Diagnosis present

## 2018-04-17 DIAGNOSIS — E039 Hypothyroidism, unspecified: Secondary | ICD-10-CM | POA: Diagnosis present

## 2018-04-17 DIAGNOSIS — Z3A00 Weeks of gestation of pregnancy not specified: Secondary | ICD-10-CM | POA: Diagnosis not present

## 2018-04-17 DIAGNOSIS — Z3490 Encounter for supervision of normal pregnancy, unspecified, unspecified trimester: Secondary | ICD-10-CM | POA: Insufficient documentation

## 2018-04-17 DIAGNOSIS — Z98891 History of uterine scar from previous surgery: Secondary | ICD-10-CM

## 2018-04-17 DIAGNOSIS — Z3A25 25 weeks gestation of pregnancy: Secondary | ICD-10-CM | POA: Diagnosis not present

## 2018-04-17 LAB — CBC WITH DIFFERENTIAL/PLATELET
Basophils Absolute: 0 10*3/uL (ref 0.0–0.1)
Basophils Relative: 0 %
Eosinophils Absolute: 0.1 10*3/uL (ref 0.0–0.7)
Eosinophils Relative: 1 %
HCT: 37.5 % (ref 36.0–46.0)
Hemoglobin: 13.1 g/dL (ref 12.0–15.0)
Lymphocytes Relative: 22 %
Lymphs Abs: 2.3 10*3/uL (ref 0.7–4.0)
MCH: 31.2 pg (ref 26.0–34.0)
MCHC: 34.9 g/dL (ref 30.0–36.0)
MCV: 89.3 fL (ref 78.0–100.0)
Monocytes Absolute: 0.3 10*3/uL (ref 0.1–1.0)
Monocytes Relative: 3 %
Neutro Abs: 7.5 10*3/uL (ref 1.7–7.7)
Neutrophils Relative %: 74 %
Platelets: 220 10*3/uL (ref 150–400)
RBC: 4.2 MIL/uL (ref 3.87–5.11)
RDW: 13.2 % (ref 11.5–15.5)
WBC: 10.2 10*3/uL (ref 4.0–10.5)

## 2018-04-17 LAB — TYPE AND SCREEN
ABO/RH(D): A POS
Antibody Screen: NEGATIVE

## 2018-04-17 LAB — COMPREHENSIVE METABOLIC PANEL
ALT: 17 U/L (ref 0–44)
AST: 17 U/L (ref 15–41)
Albumin: 2.9 g/dL — ABNORMAL LOW (ref 3.5–5.0)
Alkaline Phosphatase: 102 U/L (ref 38–126)
Anion gap: 12 (ref 5–15)
BUN: 10 mg/dL (ref 6–20)
CO2: 20 mmol/L — ABNORMAL LOW (ref 22–32)
Calcium: 9.5 mg/dL (ref 8.9–10.3)
Chloride: 104 mmol/L (ref 98–111)
Creatinine, Ser: 0.71 mg/dL (ref 0.44–1.00)
GFR calc Af Amer: >60 mL/min (ref >60)
GFR calc non Af Amer: >60 mL/min (ref >60)
Glucose, Bld: 82 mg/dL (ref 70–99)
Potassium: 4.3 mmol/L (ref 3.5–5.1)
Sodium: 136 mmol/L (ref 135–145)
Total Bilirubin: 0.4 mg/dL (ref 0.3–1.2)
Total Protein: 6 g/dL — ABNORMAL LOW (ref 6.5–8.1)

## 2018-04-17 MED ORDER — OXYCODONE-ACETAMINOPHEN 5-325 MG PO TABS: 2.0000 | ORAL_TABLET | ORAL | Status: DC | PRN

## 2018-04-17 MED ORDER — MISOPROSTOL 50MCG HALF TABLET
50.0000 ug | ORAL_TABLET | Freq: Three times a day (TID) | ORAL | Status: DC
  Administered 2018-04-17: 50 ug via ORAL
  Filled 2018-04-17 (×3): qty 1

## 2018-04-17 MED ORDER — LIDOCAINE HCL (PF) 1 % IJ SOLN
30.0000 mL | INTRAMUSCULAR | Status: DC | PRN
Start: 2018-04-17 — End: 2018-04-19
  Filled 2018-04-17: qty 30

## 2018-04-17 MED ORDER — OXYTOCIN 40 UNITS IN LACTATED RINGERS INFUSION - SIMPLE MED
1.0000 m[IU]/min | INTRAVENOUS | Status: DC
  Administered 2018-04-18: 2 m[IU]/min via INTRAVENOUS
  Filled 2018-04-17: qty 1000

## 2018-04-17 MED ORDER — TERBUTALINE SULFATE 1 MG/ML IJ SOLN: 0.2500 mg | Freq: Once | INTRAMUSCULAR | Status: DC | PRN

## 2018-04-17 MED ORDER — SOD CITRATE-CITRIC ACID 500-334 MG/5ML PO SOLN
30.0000 mL | ORAL | Status: DC | PRN
Start: 2018-04-17 — End: 2018-04-19
  Administered 2018-04-19: 30 mL via ORAL
  Filled 2018-04-17: qty 15

## 2018-04-17 MED ORDER — ZOLPIDEM TARTRATE 5 MG PO TABS
5.0000 mg | ORAL_TABLET | Freq: Every evening | ORAL | Status: DC | PRN
  Administered 2018-04-17: 5 mg via ORAL
  Filled 2018-04-17: qty 1

## 2018-04-17 MED ORDER — OXYTOCIN BOLUS FROM INFUSION: 500.0000 mL | Freq: Once | INTRAVENOUS | Status: DC

## 2018-04-17 MED ORDER — LEVOTHYROXINE SODIUM 50 MCG PO TABS
50.0000 ug | ORAL_TABLET | Freq: Every day | ORAL | Status: DC
Start: 2018-04-17 — End: 2018-04-19
  Administered 2018-04-17 – 2018-04-18 (×2): 50 ug via ORAL
  Filled 2018-04-17 (×3): qty 1

## 2018-04-17 MED ORDER — BUPROPION HCL ER (XL) 150 MG PO TB24
150.0000 mg | ORAL_TABLET | Freq: Every day | ORAL | Status: DC
  Filled 2018-04-17 (×2): qty 1

## 2018-04-17 MED ORDER — OXYCODONE-ACETAMINOPHEN 5-325 MG PO TABS: 1.0000 | ORAL_TABLET | ORAL | Status: DC | PRN

## 2018-04-17 MED ORDER — ONDANSETRON HCL 4 MG/2ML IJ SOLN
4.0000 mg | Freq: Four times a day (QID) | INTRAMUSCULAR | Status: DC | PRN
Start: 2018-04-17 — End: 2018-04-19
  Administered 2018-04-18: 4 mg via INTRAVENOUS
  Filled 2018-04-17: qty 2

## 2018-04-17 MED ORDER — ACETAMINOPHEN 325 MG PO TABS
650.0000 mg | ORAL_TABLET | ORAL | Status: DC | PRN
  Administered 2018-04-18: 650 mg via ORAL
  Filled 2018-04-17 (×3): qty 2

## 2018-04-17 MED ORDER — PANTOPRAZOLE SODIUM 40 MG PO TBEC
40.0000 mg | DELAYED_RELEASE_TABLET | Freq: Every day | ORAL | Status: DC
  Administered 2018-04-17: 40 mg via ORAL
  Filled 2018-04-17: qty 1

## 2018-04-17 MED ORDER — CALCIUM CARBONATE ANTACID 500 MG PO CHEW: 1.0000 | CHEWABLE_TABLET | Freq: Every day | ORAL | Status: DC | PRN

## 2018-04-17 MED ORDER — FLEET ENEMA 7-19 GM/118ML RE ENEM: 1.0000 | ENEMA | RECTAL | Status: DC | PRN

## 2018-04-17 MED ORDER — TERBUTALINE SULFATE 1 MG/ML IJ SOLN
0.2500 mg | Freq: Once | INTRAMUSCULAR | Status: DC | PRN
  Filled 2018-04-17: qty 1

## 2018-04-17 MED ORDER — MISOPROSTOL 25 MCG QUARTER TABLET
25.0000 ug | ORAL_TABLET | Freq: Three times a day (TID) | ORAL | Status: DC
  Administered 2018-04-17: 25 ug via VAGINAL
  Filled 2018-04-17 (×2): qty 1

## 2018-04-17 MED ORDER — OXYTOCIN 40 UNITS IN LACTATED RINGERS INFUSION - SIMPLE MED: 2.5000 [IU]/h | INTRAVENOUS | Status: DC

## 2018-04-17 MED ORDER — LACTATED RINGERS IV SOLN
500.0000 mL | INTRAVENOUS | Status: DC | PRN
  Administered 2018-04-18: 1000 mL via INTRAVENOUS

## 2018-04-17 MED ORDER — LACTATED RINGERS IV SOLN
INTRAVENOUS | Status: DC
  Administered 2018-04-17 – 2018-04-19 (×9): via INTRAVENOUS

## 2018-04-17 NOTE — Progress Notes (Signed)
Patient ID: [name], female   DOB: Jul 13, 1986, 32 y.o.   MRN: [id number]   Pt doing well  AFVSS gen NAD FHTs 130's mod var, +accels, category 1 toco irr  D/w pt cytotec for cervical ripening.  PV dose to be given ASAP  Will write for ambien, d/w pt.  Continue IOL

## 2018-04-17 NOTE — H&P (Signed)
Jessica Silva is a 32 y.o. female G2P0010 at 5837+ with PIH for IOL.  BP in office 160/100; 150/80.  Mild intermittent HA.  No RUQ pain, no scotomata.  Pt with POF - pregnancy by IVF.  Pregnancy complicated by morbid obesity and hypothyroidism.     OB History    Gravida  2   Para  0   Term  0   Preterm  0   AB  1   Living  0     SAB  1   TAB  0   Ectopic  0   Multiple  0   Live Births  0         G1 SAB G2 present;   H/o POF No abn pap No STD  Past Medical History:  Diagnosis Date  . Abnormal uterine bleeding   . Amenorrhea   . Anxiety   . Depression   . GERD (gastroesophageal reflux disease)   . Hashimoto's thyroiditis   . Hashimoto's thyroiditis   . Hormone disorder   . Infertility, female   . PONV (postoperative nausea and vomiting)   . Premature ovarian failure 2002  . Premature ovarian failure 2002  . Psychogenic air hunger    Past Surgical History:  Procedure Laterality Date  . DILATATION & CURETTAGE/HYSTEROSCOPY WITH MYOSURE N/A 05/07/2016   Procedure: DILATATION & CURETTAGE/HYSTEROSCOPY WITH MYOSURE;  Surgeon: Romualdo BolkJill Evelyn Jertson, MD;  Location: [facility];  Service: Gynecology;  Laterality: N/A;  . DILATION AND CURETTAGE OF UTERUS    . TONSILLECTOMY    . TONSILLECTOMY AND ADENOIDECTOMY    . WISDOM TOOTH EXTRACTION     Family History: family history includes Hiatal hernia in her maternal grandmother and mother; Hypertension in her sister; Irritable bowel syndrome in her brother and brother; Polycystic ovary syndrome in her sister. Social History:  reports that she has quit smoking. She has a 0.10 pack-year smoking history. She has never used smokeless tobacco. She reports that she does not drink alcohol or use drugs. married  Meds Wellbutrin, levothyroxine and PNV All: PCN hives     Maternal Diabetes: No Genetic Screening: Normal Maternal Ultrasounds/Referrals: Normal Fetal Ultrasounds or other Referrals:  None Maternal Substance Abuse:   No Significant Maternal Medications:  None Significant Maternal Lab Results:  Lab values include: Group B Strep negative Other Comments:  PIH  Review of Systems  Constitutional: Negative.   HENT: Negative.   Eyes: Negative.   Respiratory: Negative.   Cardiovascular: Negative.   Gastrointestinal: Negative.   Genitourinary: Negative.   Musculoskeletal: Positive for back pain.  Skin: Negative.   Neurological: Negative.   Psychiatric/Behavioral: Negative.    Maternal Medical History:  Contractions: Frequency: irregular.   Perceived severity is mild.    Fetal activity: Perceived fetal activity is normal.    Prenatal complications: PIH.   Prenatal Complications - Diabetes: none.    Dilation: Closed Exam by:: dr. Ellyn Hackbovard Blood pressure (!) 149/87, pulse (!) 109, temperature 98.1 F (36.7 C), temperature source Oral, resp. rate 20, height 5\' 1"  (1.549 m), weight 131.5 kg, SpO2 98 %. Maternal Exam:  Uterine Assessment: Contraction frequency is irregular.   Abdomen: Patient reports no abdominal tenderness. Fundal height is FH = 39cm.   Estimated fetal weight is 7-8#.   Fetal presentation: vertex  Introitus: Normal vulva. Normal vagina.    Physical Exam  Constitutional: She is oriented to person, place, and time. She appears well-developed and well-nourished.  HENT:  Head: Normocephalic and atraumatic.  Cardiovascular:  Normal rate and regular rhythm.  Respiratory: Effort normal. No respiratory distress. She has no wheezes.  GI: Soft. Bowel sounds are normal. She exhibits no distension. There is no tenderness.  Morbidly obese  Musculoskeletal: Normal range of motion.  Neurological: She is alert and oriented to person, place, and time.  Skin: Skin is warm and dry.  Psychiatric: She has a normal mood and affect. Her behavior is normal.    Prenatal labs: ABO, Rh: --/--/A POS (08/09 1710) Antibody: PENDING (08/09 1710) Rubella:  immune RPR:   NR HBsAg:   neg HIV:    neg GBS:   neg  Hgb 14.4/Plt 239/Ur Cx neg/GC neg/Chlneg/Varicella immune/TSH WNL Nl NT glucola 131 Nl anat, female  Growth followed - 88% last check 5#15  Assessment/Plan: 32yo G2P0 at 37+ w PIH Close monitoring cytotec and pitocin for IOL Expect SVD   Jessica Silva 04/17/2018, 6:06 PM

## 2018-04-18 ENCOUNTER — Inpatient Hospital Stay (HOSPITAL_COMMUNITY): Payer: BLUE CROSS/BLUE SHIELD | Admitting: Anesthesiology

## 2018-04-18 LAB — CBC
HCT: 38.8 % (ref 36.0–46.0)
Hemoglobin: 13.2 g/dL (ref 12.0–15.0)
MCH: 30.6 pg (ref 26.0–34.0)
MCHC: 34 g/dL (ref 30.0–36.0)
MCV: 89.8 fL (ref 78.0–100.0)
Platelets: 188 10*3/uL (ref 150–400)
RBC: 4.32 MIL/uL (ref 3.87–5.11)
RDW: 13.1 % (ref 11.5–15.5)
WBC: 12.1 10*3/uL — ABNORMAL HIGH (ref 4.0–10.5)

## 2018-04-18 LAB — RPR: RPR Ser Ql: NONREACTIVE

## 2018-04-18 MED ORDER — PHENYLEPHRINE 40 MCG/ML (10ML) SYRINGE FOR IV PUSH (FOR BLOOD PRESSURE SUPPORT)
80.0000 ug | PREFILLED_SYRINGE | INTRAVENOUS | Status: DC | PRN
  Administered 2018-04-18: 80 ug via INTRAVENOUS
  Filled 2018-04-18: qty 10

## 2018-04-18 MED ORDER — LIDOCAINE HCL (PF) 1 % IJ SOLN
INTRAMUSCULAR | Status: DC | PRN
  Administered 2018-04-18: 5 mL via EPIDURAL

## 2018-04-18 MED ORDER — LACTATED RINGERS IV SOLN: 500.0000 mL | Freq: Once | INTRAVENOUS | Status: DC

## 2018-04-18 MED ORDER — ACETAMINOPHEN 40 MG HALF SUPP: 1000.0000 mg | RECTAL | Status: DC | PRN

## 2018-04-18 MED ORDER — FENTANYL 2.5 MCG/ML BUPIVACAINE 1/10 % EPIDURAL INFUSION (WH - ANES)
14.0000 mL/h | INTRAMUSCULAR | Status: DC | PRN
  Administered 2018-04-18 (×2): 14 mL/h via EPIDURAL
  Filled 2018-04-18 (×3): qty 100

## 2018-04-18 MED ORDER — EPHEDRINE 5 MG/ML INJ: 10.0000 mg | INTRAVENOUS | Status: DC | PRN

## 2018-04-18 MED ORDER — ACETAMINOPHEN 650 MG RE SUPP
975.0000 mg | RECTAL | Status: DC | PRN
  Administered 2018-04-18: 975 mg via RECTAL
  Filled 2018-04-18: qty 2

## 2018-04-18 MED ORDER — PHENYLEPHRINE 40 MCG/ML (10ML) SYRINGE FOR IV PUSH (FOR BLOOD PRESSURE SUPPORT)
80.0000 ug | PREFILLED_SYRINGE | INTRAVENOUS | Status: DC | PRN
  Administered 2018-04-18: 80 ug via INTRAVENOUS

## 2018-04-18 MED ORDER — TERBUTALINE SULFATE 1 MG/ML IJ SOLN: 0.2500 mg | Freq: Once | INTRAMUSCULAR | Status: DC | PRN

## 2018-04-18 MED ORDER — PHENAZOPYRIDINE HCL 100 MG PO TABS
100.0000 mg | ORAL_TABLET | Freq: Three times a day (TID) | ORAL | Status: DC
  Administered 2018-04-18: 100 mg via ORAL
  Filled 2018-04-18 (×3): qty 1

## 2018-04-18 MED ORDER — GENTAMICIN SULFATE 40 MG/ML IJ SOLN
200.0000 mg | Freq: Once | INTRAVENOUS | Status: AC
  Administered 2018-04-18: 200 mg via INTRAVENOUS
  Filled 2018-04-18: qty 5

## 2018-04-18 MED ORDER — DIPHENHYDRAMINE HCL 50 MG/ML IJ SOLN: 12.5000 mg | INTRAMUSCULAR | Status: DC | PRN

## 2018-04-18 MED ORDER — OXYTOCIN 40 UNITS IN LACTATED RINGERS INFUSION - SIMPLE MED: 1.0000 m[IU]/min | INTRAVENOUS | Status: DC

## 2018-04-18 MED ORDER — BUTORPHANOL TARTRATE 1 MG/ML IJ SOLN
1.0000 mg | INTRAMUSCULAR | Status: DC | PRN
  Administered 2018-04-18 (×2): 1 mg via INTRAVENOUS
  Filled 2018-04-18 (×2): qty 1

## 2018-04-18 MED ORDER — GENTAMICIN SULFATE 40 MG/ML IJ SOLN
180.0000 mg | Freq: Three times a day (TID) | INTRAVENOUS | Status: DC
  Administered 2018-04-19: 100 mg via INTRAVENOUS
  Filled 2018-04-18 (×2): qty 4.5

## 2018-04-18 MED ORDER — CLINDAMYCIN PHOSPHATE 900 MG/50ML IV SOLN
900.0000 mg | Freq: Three times a day (TID) | INTRAVENOUS | Status: DC
  Administered 2018-04-18: 900 mg via INTRAVENOUS
  Filled 2018-04-18: qty 50

## 2018-04-18 NOTE — Progress Notes (Signed)
Pharmacy Antibiotic Note  Jessica Silva is a 32 y.o. G2P0010 at 37w admitted on 04/17/2018 for IOLwith [facility], morbid obesity and hypothyroidism. Pt now with increased temperature and Triple I. Pharmacy has been consulted for gentamicin dosing. dosing.  Plan: Gentamicin 200 mg IV loading dose, then 180 mg IV every 8 hours Gentamicin level goals: peaks 6-8 mcg/ml; troughs <1 mcg/ml Monitor serum creatinine every 72 hours while on gentamicin Serum levels as indicated.  Height: 5\' 1"  (154.9 cm) Weight: 290 lb (131.5 kg) IBW/kg (Calculated) : 47.8  Temp (24hrs), Avg:99.2 F (37.3 C), Min:97.8 F (36.6 C), Max:101.5 F (38.6 C)  Recent Labs  Lab 04/17/18 1710 04/18/18 1442  WBC 10.2 12.1*  CREATININE 0.71  --     Estimated Creatinine Clearance: 130.8 mL/min (by C-G formula based on SCr of 0.71 mg/dL).    Allergies  Allergen Reactions  . Bee Venom Hives  . Penicillins Hives    Has patient had a PCN reaction causing immediate rash, facial/tongue/throat swelling, SOB or lightheadedness with hypotension: unknown Has patient had a PCN reaction causing severe rash involving mucus membranes or skin necrosis: unknown Has patient had a PCN reaction that required hospitalization no Has patient had a PCN reaction occurring within the last 10 years: no If all of the above answers are "NO", then may proceed with Cephalosporin use.     Antimicrobials this admission: Clindamycin 8/10>>  Dose adjustments this admission: N/A  Microbiology results:  Thank you for allowing pharmacy to be a part of this patient's care.  Arelia SneddonMason, Annelle Behrendt Anne 04/18/2018 10:36 PM

## 2018-04-18 NOTE — Anesthesia Procedure Notes (Signed)
Epidural Patient location during procedure: OB Start time: 04/18/2018 3:23 PM End time: 04/18/2018 3:29 PM  Staffing Anesthesiologist: Shelton SilvasHollis, Kevin D, MD Performed: anesthesiologist   Preanesthetic Checklist Completed: patient identified, site marked, surgical consent, pre-op evaluation, timeout performed, IV checked, risks and benefits discussed and monitors and equipment checked  Epidural Patient position: sitting Prep: DuraPrep Patient monitoring: heart rate, continuous pulse ox and blood pressure Approach: midline Location: L3-L4 Injection technique: LOR saline  Needle:  Needle type: Tuohy  Needle gauge: 17 G Needle length: 9 cm Catheter type: closed end flexible Catheter size: 20 Guage Test dose: negative and 1.5% lidocaine  Assessment Events: blood not aspirated, injection not painful, no injection resistance and no paresthesia  Additional Notes LOR @ 9  Patient identified. Risks/Benefits/Options discussed with patient including but not limited to bleeding, infection, nerve damage, paralysis, failed block, incomplete pain control, headache, blood pressure changes, nausea, vomiting, reactions to medications, itching and postpartum back pain. Confirmed with bedside nurse the patient's most recent platelet count. Confirmed with patient that they are not currently taking any anticoagulation, have any bleeding history or any family history of bleeding disorders. Patient expressed understanding and wished to proceed. All questions were answered. Sterile technique was used throughout the entire procedure. Please see nursing notes for vital signs. Test dose was given through epidural catheter and negative prior to continuing to dose epidural or start infusion. Warning signs of high block given to the patient including shortness of breath, tingling/numbness in hands, complete motor block, or any concerning symptoms with instructions to call for help. Patient was given instructions on  fall risk and not to get out of bed. All questions and concerns addressed with instructions to call with any issues or inadequate analgesia.    Reason for block:procedure for pain

## 2018-04-18 NOTE — Progress Notes (Addendum)
Patient ID: [name], female   DOB: 1986/01/14, 32 y.o.   MRN: [id number]  Pt comfortable with epidural.  Some decreased BP and vomiting - recovered.  Foley bulb out.    AFVSS gen NAD FHTs 150's mod var, + accels, category 1 toco q 2min  SVE 6/80/0  Continue current mgmt.  Expect SVD

## 2018-04-18 NOTE — Progress Notes (Signed)
POC is to repeat CBC, get an epidural and the AROM.  Pt in agreement with POC.

## 2018-04-18 NOTE — Progress Notes (Signed)
Patient ID: Jessica ReachJena R Halseth, female   DOB: 05/30/1986, 32 y.o.   MRN: [id number]   Pt with some pressure.  A little overwhelmed, but OK  AFVSS gen NAD FHTs 140's, mod var, category 1 toco irr  SVE 1/50/-2  Foley bulb placed w/o diff/comp  Continue induction of labor.

## 2018-04-18 NOTE — Anesthesia Pain Management Evaluation Note (Signed)
  CRNA Pain Management Visit Note  Patient: Jessica Silva, 32 y.o., female  "Hello [facility] am a [facility]. We have an anesthesia team available at all times to provide care throughout the hospital, including epidural management and anesthesia for C-section. I don't know your plan for the delivery whether it a natural birth, water birth, IV sedation, nitrous supplementation, doula or epidural, but we want to meet your pain goals."   1.Was your pain managed to your expectations on prior hospitalizations?   No prior hospitalizations  2.What is your expectation for pain management during this hospitalization?     Epidural  3.How can we help you reach that goal? unsure  Record the patient's initial score and the patient's pain goal.   Pain: 6  Pain Goal: 8 [facility] wants you to be able to say your pain was always managed very well.  Cephus ShellingBURGER,Solan Vosler 04/18/2018

## 2018-04-18 NOTE — Progress Notes (Signed)
Patient ID: Jessica ReachJena R Silva, female   DOB: 1986-02-17, 32 y.o.   MRN: [id number]  Pt w T = 100.4, PCN allergic  Triple I - will treat w Gent/Clinda  Also Tylenol 1g  Pt 8/80/-1 per RN  Will closely monitor

## 2018-04-18 NOTE — Anesthesia Preprocedure Evaluation (Addendum)
Anesthesia Evaluation  Patient identified by MRN, date of birth, ID band Patient awake    Reviewed: Allergy & Precautions, NPO status , Patient's Chart, lab work & pertinent test results  History of Anesthesia Complications (+) PONV and history of anesthetic complications  Airway Mallampati: III       Dental no notable dental hx.    Pulmonary former smoker,    Pulmonary exam normal        Cardiovascular negative cardio ROS Normal cardiovascular exam     Neuro/Psych Anxiety Depression    GI/Hepatic Neg liver ROS, GERD  Medicated,  Endo/Other  negative endocrine ROS  Renal/GU negative Renal ROS     Musculoskeletal negative musculoskeletal ROS (+)   Abdominal (+) + obese,   Peds  Hematology negative hematology ROS (+)   Anesthesia Other Findings   Reproductive/Obstetrics                            Anesthesia Physical Anesthesia Plan  ASA: III  Anesthesia Plan: Epidural   Post-op Pain Management:    Induction:   PONV Risk Score and Plan:   Airway Management Planned: Natural Airway  Additional Equipment: None  Intra-op Plan:   Post-operative Plan:   Informed Consent: I have reviewed the patients History and Physical, chart, labs and discussed the procedure including the risks, benefits and alternatives for the proposed anesthesia with the patient or authorized representative who has indicated his/her understanding and acceptance.     Plan Discussed with: CRNA  Anesthesia Plan Comments: (Lab Results      Component                Value               Date                      WBC                      12.1 (H)            04/18/2018                HGB                      13.2                04/18/2018                HCT                      38.8                04/18/2018                MCV                      89.8                04/18/2018                PLT                       188                 04/18/2018           )  Anesthesia Quick Evaluation  

## 2018-04-19 ENCOUNTER — Encounter (HOSPITAL_COMMUNITY): Payer: Self-pay | Admitting: *Deleted

## 2018-04-19 ENCOUNTER — Encounter (HOSPITAL_COMMUNITY)
Admission: AD | Disposition: A | Discharge status: Home / Self Care | Payer: Self-pay | Source: Home / Self Care | Attending: Obstetrics and Gynecology

## 2018-04-19 DIAGNOSIS — Z98891 History of uterine scar from previous surgery: Secondary | ICD-10-CM

## 2018-04-19 SURGERY — Surgical Case
Anesthesia: Epidural

## 2018-04-19 MED ORDER — NALBUPHINE HCL 10 MG/ML IJ SOLN: 5.0000 mg | Freq: Once | INTRAMUSCULAR | Status: DC | PRN

## 2018-04-19 MED ORDER — SCOPOLAMINE 1 MG/3DAYS TD PT72
MEDICATED_PATCH | TRANSDERMAL | Status: DC | PRN
  Administered 2018-04-19: 1 via TRANSDERMAL

## 2018-04-19 MED ORDER — KETOROLAC TROMETHAMINE 30 MG/ML IJ SOLN
30.0000 mg | Freq: Four times a day (QID) | INTRAMUSCULAR | Status: AC | PRN
  Administered 2018-04-19: 30 mg via INTRAVENOUS

## 2018-04-19 MED ORDER — MENTHOL 3 MG MT LOZG: 1.0000 | LOZENGE | OROMUCOSAL | Status: DC | PRN

## 2018-04-19 MED ORDER — ONDANSETRON HCL 4 MG/2ML IJ SOLN: 4.0000 mg | Freq: Three times a day (TID) | INTRAMUSCULAR | Status: DC | PRN

## 2018-04-19 MED ORDER — KETOROLAC TROMETHAMINE 30 MG/ML IJ SOLN
INTRAMUSCULAR | Status: AC
  Administered 2018-04-19: 30 mg via INTRAMUSCULAR
  Filled 2018-04-19: qty 1

## 2018-04-19 MED ORDER — COCONUT OIL OIL: 1.0000 "application " | TOPICAL_OIL | Status: DC | PRN

## 2018-04-19 MED ORDER — DIPHENHYDRAMINE HCL 25 MG PO CAPS: 25.0000 mg | ORAL_CAPSULE | Freq: Four times a day (QID) | ORAL | Status: DC | PRN

## 2018-04-19 MED ORDER — MEPERIDINE HCL 25 MG/ML IJ SOLN
INTRAMUSCULAR | Status: DC | PRN
  Administered 2018-04-19: 12.5 mg via INTRAVENOUS

## 2018-04-19 MED ORDER — LACTATED RINGERS IV SOLN: INTRAVENOUS | Status: DC

## 2018-04-19 MED ORDER — OXYTOCIN 10 UNIT/ML IJ SOLN
INTRAVENOUS | Status: DC | PRN
  Administered 2018-04-19: 40 [IU] via INTRAVENOUS

## 2018-04-19 MED ORDER — MORPHINE SULFATE (PF) 0.5 MG/ML IJ SOLN
INTRAMUSCULAR | Status: AC
  Filled 2018-04-19: qty 10

## 2018-04-19 MED ORDER — FENTANYL CITRATE (PF) 100 MCG/2ML IJ SOLN
INTRAMUSCULAR | Status: AC
  Filled 2018-04-19: qty 2

## 2018-04-19 MED ORDER — DIPHENHYDRAMINE HCL 25 MG PO CAPS: 25.0000 mg | ORAL_CAPSULE | ORAL | Status: DC | PRN

## 2018-04-19 MED ORDER — ACETAMINOPHEN 325 MG PO TABS
650.0000 mg | ORAL_TABLET | ORAL | Status: DC | PRN
  Administered 2018-04-20 – 2018-04-22 (×6): 650 mg via ORAL
  Filled 2018-04-19 (×6): qty 2

## 2018-04-19 MED ORDER — OXYCODONE HCL 5 MG PO TABS
10.0000 mg | ORAL_TABLET | ORAL | Status: DC | PRN
  Administered 2018-04-21 – 2018-04-22 (×3): 10 mg via ORAL
  Filled 2018-04-19 (×3): qty 2

## 2018-04-19 MED ORDER — ONDANSETRON HCL 4 MG/2ML IJ SOLN
INTRAMUSCULAR | Status: DC | PRN
  Administered 2018-04-19: 4 mg via INTRAVENOUS

## 2018-04-19 MED ORDER — OXYTOCIN 10 UNIT/ML IJ SOLN
INTRAMUSCULAR | Status: AC
  Filled 2018-04-19: qty 4

## 2018-04-19 MED ORDER — MEPERIDINE HCL 25 MG/ML IJ SOLN: 6.2500 mg | INTRAMUSCULAR | Status: DC | PRN

## 2018-04-19 MED ORDER — SIMETHICONE 80 MG PO CHEW: 80.0000 mg | CHEWABLE_TABLET | ORAL | Status: DC | PRN

## 2018-04-19 MED ORDER — DIPHENHYDRAMINE HCL 50 MG/ML IJ SOLN: 12.5000 mg | INTRAMUSCULAR | Status: DC | PRN

## 2018-04-19 MED ORDER — PHENYLEPHRINE HCL 10 MG/ML IJ SOLN
INTRAMUSCULAR | Status: DC | PRN
  Administered 2018-04-19 (×7): 80 ug via INTRAVENOUS
  Administered 2018-04-19: 40 ug via INTRAVENOUS

## 2018-04-19 MED ORDER — OXYTOCIN 40 UNITS IN LACTATED RINGERS INFUSION - SIMPLE MED: 2.5000 [IU]/h | INTRAVENOUS | Status: AC

## 2018-04-19 MED ORDER — CLINDAMYCIN PHOSPHATE 900 MG/50ML IV SOLN: 900.0000 mg | Freq: Three times a day (TID) | INTRAVENOUS | Status: DC

## 2018-04-19 MED ORDER — TETANUS-DIPHTH-ACELL PERTUSSIS 5-2.5-18.5 LF-MCG/0.5 IM SUSP: 0.5000 mL | Freq: Once | INTRAMUSCULAR | Status: DC

## 2018-04-19 MED ORDER — DIBUCAINE 1 % RE OINT: 1.0000 "application " | TOPICAL_OINTMENT | RECTAL | Status: DC | PRN

## 2018-04-19 MED ORDER — FENTANYL CITRATE (PF) 100 MCG/2ML IJ SOLN: 25.0000 ug | INTRAMUSCULAR | Status: DC | PRN

## 2018-04-19 MED ORDER — OXYCODONE HCL 5 MG PO TABS
5.0000 mg | ORAL_TABLET | ORAL | Status: DC | PRN
  Administered 2018-04-20 – 2018-04-22 (×4): 5 mg via ORAL
  Filled 2018-04-19 (×4): qty 1

## 2018-04-19 MED ORDER — SODIUM BICARBONATE 8.4 % IV SOLN
INTRAVENOUS | Status: DC | PRN
  Administered 2018-04-19: 3 mL via EPIDURAL
  Administered 2018-04-19: 5 mL via EPIDURAL
  Administered 2018-04-19: 2 mL via EPIDURAL
  Administered 2018-04-19: 3 mL via EPIDURAL

## 2018-04-19 MED ORDER — GENTAMICIN SULFATE 40 MG/ML IJ SOLN: 180.0000 mg | Freq: Three times a day (TID) | INTRAVENOUS | Status: DC

## 2018-04-19 MED ORDER — FENTANYL CITRATE (PF) 100 MCG/2ML IJ SOLN
INTRAMUSCULAR | Status: DC | PRN
  Administered 2018-04-19 (×2): 50 ug via INTRAVENOUS

## 2018-04-19 MED ORDER — DEXAMETHASONE SODIUM PHOSPHATE 4 MG/ML IJ SOLN
INTRAMUSCULAR | Status: AC
  Filled 2018-04-19: qty 1

## 2018-04-19 MED ORDER — LACTATED RINGERS IV SOLN
INTRAVENOUS | Status: DC | PRN
  Administered 2018-04-19: 07:00:00 via INTRAVENOUS

## 2018-04-19 MED ORDER — MEPERIDINE HCL 25 MG/ML IJ SOLN
INTRAMUSCULAR | Status: AC
  Filled 2018-04-19: qty 1

## 2018-04-19 MED ORDER — LIDOCAINE-EPINEPHRINE (PF) 2 %-1:200000 IJ SOLN
INTRAMUSCULAR | Status: AC
  Filled 2018-04-19: qty 20

## 2018-04-19 MED ORDER — NALBUPHINE HCL 10 MG/ML IJ SOLN: 5.0000 mg | INTRAMUSCULAR | Status: DC | PRN

## 2018-04-19 MED ORDER — GENTAMICIN SULFATE 40 MG/ML IJ SOLN: INTRAMUSCULAR | Status: DC

## 2018-04-19 MED ORDER — KETOROLAC TROMETHAMINE 30 MG/ML IJ SOLN
INTRAMUSCULAR | Status: AC
  Administered 2018-04-19: 30 mg via INTRAVENOUS
  Filled 2018-04-19: qty 1

## 2018-04-19 MED ORDER — DEXTROSE 5 % IV SOLN
1.0000 ug/kg/h | INTRAVENOUS | Status: DC | PRN
  Filled 2018-04-19: qty 5

## 2018-04-19 MED ORDER — NALOXONE HCL 0.4 MG/ML IJ SOLN: 0.4000 mg | INTRAMUSCULAR | Status: DC | PRN

## 2018-04-19 MED ORDER — SIMETHICONE 80 MG PO CHEW
80.0000 mg | CHEWABLE_TABLET | Freq: Three times a day (TID) | ORAL | Status: DC
  Administered 2018-04-19 – 2018-04-22 (×8): 80 mg via ORAL
  Filled 2018-04-19 (×8): qty 1

## 2018-04-19 MED ORDER — WITCH HAZEL-GLYCERIN EX PADS: 1.0000 "application " | MEDICATED_PAD | CUTANEOUS | Status: DC | PRN

## 2018-04-19 MED ORDER — ONDANSETRON HCL 4 MG/2ML IJ SOLN
INTRAMUSCULAR | Status: AC
  Filled 2018-04-19: qty 2

## 2018-04-19 MED ORDER — SCOPOLAMINE 1 MG/3DAYS TD PT72
MEDICATED_PATCH | TRANSDERMAL | Status: AC
  Filled 2018-04-19: qty 1

## 2018-04-19 MED ORDER — MORPHINE SULFATE (PF) 0.5 MG/ML IJ SOLN
INTRAMUSCULAR | Status: DC | PRN
  Administered 2018-04-19: 3 mg via EPIDURAL

## 2018-04-19 MED ORDER — SODIUM CHLORIDE 0.9% FLUSH: 3.0000 mL | INTRAVENOUS | Status: DC | PRN

## 2018-04-19 MED ORDER — GENTAMICIN SULFATE 40 MG/ML IJ SOLN
180.0000 mg | Freq: Three times a day (TID) | INTRAVENOUS | Status: AC
  Administered 2018-04-19 – 2018-04-20 (×2): 180 mg via INTRAVENOUS
  Filled 2018-04-19 (×2): qty 4.5

## 2018-04-19 MED ORDER — SIMETHICONE 80 MG PO CHEW
80.0000 mg | CHEWABLE_TABLET | ORAL | Status: DC
  Administered 2018-04-19 – 2018-04-22 (×3): 80 mg via ORAL
  Filled 2018-04-19 (×3): qty 1

## 2018-04-19 MED ORDER — FAMOTIDINE 20 MG PO TABS
20.0000 mg | ORAL_TABLET | Freq: Every day | ORAL | Status: DC
  Administered 2018-04-20 – 2018-04-22 (×3): 20 mg via ORAL
  Filled 2018-04-19 (×3): qty 1

## 2018-04-19 MED ORDER — IBUPROFEN 800 MG PO TABS
800.0000 mg | ORAL_TABLET | Freq: Three times a day (TID) | ORAL | Status: DC
  Administered 2018-04-19 – 2018-04-22 (×9): 800 mg via ORAL
  Filled 2018-04-19 (×9): qty 1

## 2018-04-19 MED ORDER — ZOLPIDEM TARTRATE 5 MG PO TABS: 5.0000 mg | ORAL_TABLET | Freq: Every evening | ORAL | Status: DC | PRN

## 2018-04-19 MED ORDER — PHENYLEPHRINE 40 MCG/ML (10ML) SYRINGE FOR IV PUSH (FOR BLOOD PRESSURE SUPPORT)
PREFILLED_SYRINGE | INTRAVENOUS | Status: AC
  Filled 2018-04-19: qty 20

## 2018-04-19 MED ORDER — PRENATAL MULTIVITAMIN CH
1.0000 | ORAL_TABLET | Freq: Every day | ORAL | Status: DC
  Administered 2018-04-20: 1 via ORAL
  Filled 2018-04-19 (×2): qty 1

## 2018-04-19 MED ORDER — PROMETHAZINE HCL 25 MG/ML IJ SOLN
6.2500 mg | INTRAMUSCULAR | Status: DC | PRN
Start: 2018-04-19 — End: 2018-04-19

## 2018-04-19 MED ORDER — ALBUMIN HUMAN 5 % IV SOLN
INTRAVENOUS | Status: DC | PRN
  Administered 2018-04-19: 07:00:00 via INTRAVENOUS

## 2018-04-19 MED ORDER — SENNOSIDES-DOCUSATE SODIUM 8.6-50 MG PO TABS
2.0000 | ORAL_TABLET | ORAL | Status: DC
  Administered 2018-04-19 – 2018-04-22 (×3): 2 via ORAL
  Filled 2018-04-19 (×3): qty 2

## 2018-04-19 MED ORDER — DEXAMETHASONE SODIUM PHOSPHATE 4 MG/ML IJ SOLN
INTRAMUSCULAR | Status: DC | PRN
  Administered 2018-04-19: 4 mg via INTRAVENOUS

## 2018-04-19 MED ORDER — SCOPOLAMINE 1 MG/3DAYS TD PT72
1.0000 | MEDICATED_PATCH | Freq: Once | TRANSDERMAL | Status: DC
  Filled 2018-04-19: qty 1

## 2018-04-19 MED ORDER — KETOROLAC TROMETHAMINE 30 MG/ML IJ SOLN
30.0000 mg | Freq: Four times a day (QID) | INTRAMUSCULAR | Status: AC | PRN
  Administered 2018-04-19: 30 mg via INTRAMUSCULAR

## 2018-04-19 SURGICAL SUPPLY — 33 items
BENZOIN TINCTURE PRP APPL 2/3 (GAUZE/BANDAGES/DRESSINGS) ×2 IMPLANT
CHLORAPREP W/TINT 26ML (MISCELLANEOUS) ×2 IMPLANT
CLAMP CORD UMBIL (MISCELLANEOUS) IMPLANT
CLOTH BEACON ORANGE TIMEOUT ST (SAFETY) ×2 IMPLANT
DRSG OPSITE POSTOP 4X10 (GAUZE/BANDAGES/DRESSINGS) ×2 IMPLANT
ELECT REM PT RETURN 9FT ADLT (ELECTROSURGICAL) ×2
ELECTRODE REM PT RTRN 9FT ADLT (ELECTROSURGICAL) ×1 IMPLANT
EXTRACTOR VACUUM M CUP 4 TUBE (SUCTIONS) IMPLANT
GLOVE BIO SURGEON STRL SZ 6.5 (GLOVE) ×2 IMPLANT
GLOVE BIOGEL PI IND STRL 7.0 (GLOVE) ×1 IMPLANT
GLOVE BIOGEL PI INDICATOR 7.0 (GLOVE) ×1
GOWN STRL REUS W/TWL LRG LVL3 (GOWN DISPOSABLE) ×4 IMPLANT
KIT ABG SYR 3ML LUER SLIP (SYRINGE) IMPLANT
KIT PREVENA INCISION MGT20CM45 (CANNISTER) ×2 IMPLANT
NEEDLE HYPO 25X5/8 SAFETYGLIDE (NEEDLE) IMPLANT
NS IRRIG 1000ML POUR BTL (IV SOLUTION) ×2 IMPLANT
PACK C SECTION WH (CUSTOM PROCEDURE TRAY) ×2 IMPLANT
PAD OB MATERNITY 4.3X12.25 (PERSONAL CARE ITEMS) ×2 IMPLANT
PENCIL SMOKE EVAC W/HOLSTER (ELECTROSURGICAL) ×2 IMPLANT
RTRCTR C-SECT PINK 25CM LRG (MISCELLANEOUS) ×2 IMPLANT
STRIP CLOSURE SKIN 1/2X4 (GAUZE/BANDAGES/DRESSINGS) ×2 IMPLANT
SUT MNCRL 0 VIOLET CTX 36 (SUTURE) ×2 IMPLANT
SUT MONOCRYL 0 CTX 36 (SUTURE) ×2
SUT PLAIN 1 NONE 54 (SUTURE) IMPLANT
SUT PLAIN 2 0 XLH (SUTURE) ×2 IMPLANT
SUT VIC AB 0 CT1 27 (SUTURE) ×2
SUT VIC AB 0 CT1 27XBRD ANBCTR (SUTURE) ×2 IMPLANT
SUT VIC AB 2-0 CT1 27 (SUTURE) ×1
SUT VIC AB 2-0 CT1 TAPERPNT 27 (SUTURE) ×1 IMPLANT
SUT VIC AB 4-0 KS 27 (SUTURE) ×2 IMPLANT
SYR BULB IRRIGATION 50ML (SYRINGE) ×2 IMPLANT
TOWEL OR 17X24 6PK STRL BLUE (TOWEL DISPOSABLE) ×2 IMPLANT
TRAY FOLEY W/BAG SLVR 14FR LF (SET/KITS/TRAYS/PACK) ×2 IMPLANT

## 2018-04-19 NOTE — Progress Notes (Signed)
Spoke to Dr. Ellyn HackBovard regarding pain level on PACU and patient receiving Toradol 30IV/30IM. Pharmacist had updated Dr. Ellyn HackBovard by phone.  Dr Ellyn HackBovard states that due to patient's pain level throughout her labor and deliver and recovery the Toradol order can be given x1 in the 1st PACU recovery session as Toradol 30mg  IV and Toradol 30mg  IM. Pt had 1000cc urine out in PACU and was demonstrating adequate urine output.

## 2018-04-19 NOTE — Transfer of Care (Signed)
Immediate Anesthesia Transfer of Care Note  Patient: Jessica ReachJena R Vanderwall  Procedure(s) Performed: CESAREAN SECTION (N/A )  Patient Location: PACU  Anesthesia Type:Epidural  Level of Consciousness: awake and alert   Airway & Oxygen Therapy: Patient Spontanous Breathing  Post-op Assessment: Report given to RN and Post -op Vital signs reviewed and stable  Post vital signs: Reviewed  Last Vitals:  Vitals Value Taken Time  BP    Temp    Pulse    Resp    SpO2      Last Pain:  Vitals:   04/19/18 0415  TempSrc: Axillary  PainSc:          Complications: No apparent anesthesia complications

## 2018-04-19 NOTE — Progress Notes (Signed)
In OR (upon PACU transfer and upon admission to PACU) pt noted to have 1 nickel sized and 1 6 X2cm purple pink bruise noted on left inner thight. Pt and hushband shown bruise. Pt states this is about the location of tape used to hold foley bulb.

## 2018-04-19 NOTE — Lactation Note (Signed)
This note was copied from a baby's chart. Lactation Consultation Note  Patient Name: Jessica Silva Today's Date: 04/19/2018 Reason for consult: Initial assessment;Early term 37-38.6wks;1st time breastfeeding;Primapara  P1 mother whose infant is now 628 hours old.  This is an ETI.  Parents are very sleepy and mother has recently attempted to breastfeed.  Baby was too sleepy so I suggested they rest now and call me back when baby shows feeding cues.  Parents happy with this plan. I will do more teaching when I return.  Encouraged feeding 8-12 times/24 hours or earlier if baby shows feeding cues.  Reviewed feeding cues.  Mother taught hand expression in PACU but will also review at the next feeding.    Mom made aware of O/P services, breastfeeding support groups, community resources, and our phone # for post-discharge questions. Mother will call for latch assistance with next feed.   Maternal Data Formula Feeding for Exclusion: No Has patient been taught Hand Expression?: Yes Does the patient have breastfeeding experience prior to this delivery?: No  Feeding    LATCH Score                   Interventions    Lactation Tools Discussed/Used     Consult Status Consult Status: Follow-up Date: 04/20/18 Follow-up type: In-patient    [name] 04/19/2018, 3:40 PM

## 2018-04-19 NOTE — Addendum Note (Signed)
Addendum  created 04/19/18 1606 by Rica Recordsickelton, Delquan Poucher, CRNA   Sign clinical note

## 2018-04-19 NOTE — Anesthesia Postprocedure Evaluation (Addendum)
Anesthesia Post Note  Patient: Jessica Silva  Procedure(s) Performed: CESAREAN SECTION (N/A )     Patient location during evaluation: PACU Anesthesia Type: Epidural Level of consciousness: oriented and awake and alert Pain management: pain level controlled Vital Signs Assessment: post-procedure vital signs reviewed and stable Respiratory status: spontaneous breathing, respiratory function stable and nonlabored ventilation Cardiovascular status: blood pressure returned to baseline and stable Postop Assessment: no headache, no backache, no apparent nausea or vomiting, patient able to bend at knees and epidural receding Anesthetic complications: no    Last Vitals:  Vitals:   04/19/18 0855 04/19/18 0900  BP:  133/78  Pulse: (!) 109 (!) 103  Resp: 18 17  Temp:  37.1 C  SpO2: 95% 95%    Last Pain:  Vitals:   04/19/18 0900  TempSrc: Oral  PainSc:    Pain Goal:                 [name] A.

## 2018-04-19 NOTE — Brief Op Note (Signed)
04/19/2018  8:05 AM  PATIENT:  Jessica Silva  32 y.o. female  PRE-OPERATIVE DIAGNOSIS:  cesarean section; arrest of descent  POST-OPERATIVE DIAGNOSIS:  cesarean section; arrest of descent  PROCEDURE:  Procedure(s): CESAREAN SECTION (N/A)  SURGEON:  Surgeon(s) and Role:    * Bovard-Stuckert, Jamaira Sherk, MD - Primary  ANESTHESIA:   epidural   COMPLICATIONS: possible bladder injury - sterile milk check negative  FINDINGS: viable female infant at 6:51am, apgars 8/8, wt P; nl uterus tubes and ovaries.  EBL:  781 mL uop and IVF per anesthesia (uop clearing at end of case)  BLOOD ADMINISTERED:none  DRAINS: Urinary Catheter (Foley)   LOCAL MEDICATIONS USED:  NONE  SPECIMEN:  Source of Specimen:  Placenta   DISPOSITION OF SPECIMEN:  L&D  COUNTS:  YES  TOURNIQUET:  * No tourniquets in log *  DICTATION: .Other Dictation: Dictation Number [id number]  PLAN OF CARE: Admit to inpatient   PATIENT DISPOSITION:  PACU - hemodynamically stable.   Delay start of Pharmacological VTE agent (>24hrs) due to surgical blood loss or risk of bleeding: not applicable

## 2018-04-19 NOTE — Progress Notes (Addendum)
Patient ID: [name], female   DOB: [birth year], 32 y.o.   MRN: [id number]   D/W pt slow - little progress, still w some pushing to do.  Mother, FOB and I discussed options including continuing pushing, have been for 1.5 hrs, poss vaccuum - have also d/w pt risks assoc - fetus at 88% seveal weeks ago, possible shoulder dystocia, etc.) d/w pt LTCS - inc r/b/a - including but not limited to bleeding, infection and damage to surrounding tissue, injury to infant, difficulty healing.  Pt voices understanding.  Desires to proceed w LTCS.  Pt aware may get push from below, baby deep in pelvis.   Will proceed with LTCS when OR ready.    Ancef for prophylaxis

## 2018-04-19 NOTE — Progress Notes (Signed)
[id number] report given to dr foster, pt moving legs upon arrival to PACU. Pain under control. OK to move to room 101.  0925 Epidural catheter removed. Pt has red skin  (intact) where epidural tape was located. Pt notified.

## 2018-04-19 NOTE — Anesthesia Postprocedure Evaluation (Signed)
Anesthesia Post Note  Patient: Elwyn ReachJena R Dinunzio  Procedure(s) Performed: CESAREAN SECTION (N/A )     Patient location during evaluation: Mother Baby Anesthesia Type: Epidural Level of consciousness: awake and alert Pain management: pain level controlled Vital Signs Assessment: post-procedure vital signs reviewed and stable Respiratory status: spontaneous breathing, nonlabored ventilation and respiratory function stable Cardiovascular status: stable Postop Assessment: no headache, no backache, epidural receding and patient able to bend at knees Anesthetic complications: no    Last Vitals:  Vitals:   04/19/18 1335 04/19/18 1504  BP: (!) 109/59 (!) 106/50  Pulse: 85 (!) 108  Resp:    Temp:  37.2 C  SpO2:  93%    Last Pain:  Vitals:   04/19/18 1504  TempSrc: Oral  PainSc: 1    Pain Goal:                 Rica RecordsICKELTON,Kelicia Youtz

## 2018-04-19 NOTE — Progress Notes (Signed)
Mother of baby was referred for history of depression and anxiety. Referral screened out by CSW because per chart review, MOB is actively taking 150mg  of Welbutrin XL daily to address her symptoms. No issues or concerns noted regarding diagnosis in prenatal record.   Please contact CSW if mother of baby requests, if needs arise, or if mother of baby scores greater than a nine or answers yes to question ten on [place] Postpartum Depression Screen.  Edwin Dadaarol Gottlieb Zuercher, MSW, LCSW-A Clinical Social Worker [facility] [phone]

## 2018-04-19 NOTE — Progress Notes (Signed)
Patient ID: Elwyn ReachJena R Lodwick, female   DOB: Jan 12, 1986, 32 y.o.   MRN: [id number]  Pt progressed to ant lip/complete/+2 - small anterior lip of cervic reduced w pushing.  Pt uncomfortable with some relief  While pushing.  Has received Clinda and Gent for Triple I  T = 100.3 Tm = 101.5 VSS gen NAD FHTs 150's mod var, some variables w pushing.  Category 1-2 toco q 2-605min  SVE 10/100/+2-3  Pushing well.  Expect SVD soon

## 2018-04-20 LAB — CBC
HCT: 29.8 % — ABNORMAL LOW (ref 36.0–46.0)
Hemoglobin: 10.6 g/dL — ABNORMAL LOW (ref 12.0–15.0)
MCH: 31.7 pg (ref 26.0–34.0)
MCHC: 35.6 g/dL (ref 30.0–36.0)
MCV: 89.2 fL (ref 78.0–100.0)
Platelets: 196 10*3/uL (ref 150–400)
RBC: 3.34 MIL/uL — ABNORMAL LOW (ref 3.87–5.11)
RDW: 13.4 % (ref 11.5–15.5)
WBC: 15.3 10*3/uL — ABNORMAL HIGH (ref 4.0–10.5)

## 2018-04-20 NOTE — Progress Notes (Signed)
Subjective: Postpartum Day 1: Cesarean Delivery Patient reports tolerating PO and no problems voiding.  Ambulating well  Objective: Vital signs in last 24 hours: Temp:  [97.7 F (36.5 C)-99.3 F (37.4 C)] 97.7 F (36.5 C) (08/12 0215) Pulse Rate:  [85-111] 88 (08/12 0215) Resp:  [16-18] 16 (08/12 0215) BP: (95-141)/(50-78) 105/59 (08/12 0215) SpO2:  [93 %-98 %] 97 % (08/12 0215)  Physical Exam:  General: alert and cooperative Lochia: appropriate Uterine Fundus: firm Incision: Prevena dressing intact   Recent Labs    04/18/18 1442 04/20/18 0503  HGB 13.2 10.6*  HCT 38.8 29.8*    Assessment/Plan: Status post Cesarean section. Doing well postoperatively.  Continue current care. Possible d/c tomorrow Prevena in place x 1 week per Dr. Ellyn HackBovard, then to office to remove  Oliver PilaKathy W Sye Schroepfer 04/20/2018, 8:56 AM

## 2018-04-20 NOTE — Lactation Note (Signed)
This note was copied from a baby's chart. Lactation Consultation Note Baby 21 hrs old, has only fed for 2 times. [place] doesn't feel that baby could had a good feed d/t Large tubular breast, tiny semi flat nipple, not very compressible w/some areola edema. Reverse pressure to areola to soften tissue.  Massaged breast, hand expression for a couple minutes before a few dots of colostrum obtained.  Stimulated baby to waken. W/gloved finger applied colostrum to baby's tongue to stimulate to suckle. Diaper change w/wet and stool noted. repositioned mom up in bed. In football position latched baby using #16 NS. Taught mom "C" hold.  Baby suckling well. No colostrum noted in NS yet. Newborn behavior, STS, I&O, cluster feeding, supply and demand discussed.  Mom encouraged to feed baby 8-12 times/24 hours and with feeding cues. Mom encouraged to feed baby 8-12 times/24 hours and with feeding cues. Mom encouraged to waken baby for feeds if hasn't cued in 3 hrs.   Baby has severe molding to the head, bruising and abrasion noted as well. Discussed probability of jaundice, needing to be proactive in prevention and maintenance.   Asked RN to set up DEBP. LC placed kit into rm. For DEBP. Mom OK w/supplementing. LC concerned over bruising and not able to collect colostrum as well as breast anatomy. Hopefully soon edema will resolve and mom can latch w/o NS. Mom will need support and assistance until able to apply NS. Mom may not be a candidate for NS. Encouraged to call for assistance in feeding. Stressed importance of waking baby to feed q3 hrs if hasn't cued to feed. Alert RN if baby not interested in feeding. Discussed jaundice can cause excessive sleepiness and not showing hunger cues.  [place] brochure given w/resources, support groups and [place] services. Patient Name: Jessica Silva ZWCHE'N Date: 04/20/2018 Reason for consult: Follow-up assessment;1st time breastfeeding;Early term 37-38.6wks   Maternal Data     Feeding Feeding Type: Breast Fed  LATCH Score Latch: Grasps breast easily, tongue down, lips flanged, rhythmical sucking.  Audible Swallowing: None  Type of Nipple: Everted at rest and after stimulation(semi flat)  Comfort (Breast/Nipple): Soft / non-tender  Hold (Positioning): Full assist, staff holds infant at breast  LATCH Score: 6  Interventions Interventions: Breast feeding basics reviewed;Support pillows;Assisted with latch;Position options;Skin to skin;Breast massage;Hand express;Shells;Pre-pump if needed;Reverse pressure;Hand pump;Breast compression;Adjust position  Lactation Tools Discussed/Used Tools: Shells;Pump;Nipple [place] Fuel;Flanges Nipple shield size: 16 Flange Size: 21 Shell Type: Inverted Breast pump type: Manual WIC Program: No Pump Review: Setup, frequency, and cleaning;Milk Storage Initiated by:: Allayne Stack RN IBCLC Date initiated:: 04/20/18   Consult Status Consult Status: Follow-up Date: 04/20/18 Follow-up type: In-patient    [name], Elta [place] 04/20/2018, 4:04 AM

## 2018-04-20 NOTE — Op Note (Signed)
NAME: Elwyn ReachLUMMER, Jessica R. MEDICAL RECORD [id number] ACCOUNT [id number] DATE OF BIRTH:01-14-1986 FACILITY: [facility] LOCATION: [id number] PHYSICIAN:Yoan Sallade BOVARD-STUCKERT, Jessica Silva  OPERATIVE REPORT  DATE OF PROCEDURE:  04/19/2018  PREOPERATIVE DIAGNOSES:  Intrauterine pregnancy at term, arrest of descent.  POSTOPERATIVE DIAGNOSES:  Intrauterine pregnancy at term, arrest of descent.  Delivered.  PROCEDURE:  Low transverse cesarean section.  SURGEON:  Sherian ReinJody Bovard-Stuckert, Jessica Silva  ANESTHESIA:  Epidural.  COMPLICATIONS:  Possible bladder injury with a sterile milk check that was negative.  FINDINGS:  Viable female infant at 6:51 a.m. with Apgars of 8 at one minute and 8 at five minutes and weight pending at the time of dictation.  Normal uterus, tubes and ovaries were noted.  ESTIMATED BLOOD LOSS:  Approximately 781 mL.  URINE OUTPUT AND IV FLUIDS:  Per anesthesia with urinary output clear from being blood-tinged at the end of the case.  PATHOLOGY:  Placenta to labor and delivery.  DESCRIPTION OF PROCEDURE:  After informed consent was reviewed with the patient and her spouse, she was transported to the OR, placed on the table in supine position.  After epidural was dosed, her Foley catheter was then replaced.  She was prepped and  draped in the normal sterile fashion.  A Pfannenstiel skin incision was made after an appropriate timeout was performed.  A Pfannenstiel incision was made at the level of approximately 2 fingerbreadths over the pubic symphysis, carried through the  underlying layer of fascia sharply.  The fascia was incised in the midline.  The incision was extended laterally with Mayo scissors.  The superior aspect of the fascial incision was grasped with Kocher clamps, elevated and the rectus muscles were  dissected off both bluntly and sharply.  Midline was easily identified, and the peritoneum was entered bluntly.  The excision was extended inferiorly and superiorly with good visualization  of the bladder.  Alexis skin retractor was placed, carefully  making sure that no bowel was entrapped.  The uterus was explored.  The vesicouterine peritoneum was incised and bladder flap was created.  Uterus was incised in transverse fashion.  Infant was delivered from a vertex presentation with the aid of a Kiwi  vacuum.  Nose and mouth were suctioned on the field.  The cord was clamped and cut after a minute.  The infant was then handed off to the waiting pediatric staff.  The placenta was manually extracted.  The uterus was cleared of all clot and debris.   Uterine incision was closed with first layer of running locked suture.  It was noted that the bladder was close to this incision.  Therefore, it was filled with sterile milk.  It was found to be intact.  An imbricating layer was then placed.  The gutters  were cleared of all clot and debris.  The Jon Gillslexis was removed.  The peritoneum was reapproximated with 2-0 Vicryl in a running fashion.  The subfascial planes were inspected and found to be hemostatic.  The fascia was reapproximated with 0 Vicryl in 1  suture.  After the fascia was closed, there was noted to be a defect.  This was oversewn with 0 Vicryl in the midline.  The subcuticular adipose layer was made hemostatic with Bovie cautery.  The dead space was closed with plain gut.  The skin was closed  with 4-0 Vicryl on a Keith needle.  The Prevena dressing was then applied.  The patient tolerated the procedure well.  Sponge, lap and needle count was correct x2 per the operating staff.  LN/NUANCE  D:04/19/2018 T:04/20/2018 JOB:[id number]

## 2018-04-20 NOTE — Lactation Note (Signed)
This note was copied from a baby's chart. Lactation Consultation Note  Patient Name: Jessica Silva MVHQI'OToday's Date: 04/20/2018 Reason for consult: Follow-up assessment;Early term 37-38.6wks;Difficult latch  P1 mother whose infant is now 8040 hours old.  Baby has not breastfed well since delivery.  Baby fussy when I entered.  Offered to assist with latch and mother accepted.  Mother's breasts are large, soft and non tender with short shafted nipples bilaterally.  Baby sucked well on my gloved finger to calm before latching.  Attempted to latch in the football hold on the left breast without success.  She was too frantic and would not settle down to maintain a latch.  Burped her and tried in the cross cradle hold on the left breast.  After a few attempts she latched and sucked a couple of times but did not stay latched.  She self released and I attempted a few more times.  Each time she could latch but refused to suck.   Mother has just started pumping with the DEBP but is not obtaining any colostrum.  I suggested we begin supplementation to get some nourishment into baby and to help prevent jaundice.  Parents in complete agreement with this plan.  Mother stated, "I just want to see her eat." LEAD teaching completed and parents choose the curved tip syringe for supplementation. With curved tip syringe and Sim 19 I demonstrated feeding for the parents.  Baby sucked easily with this method.  She took 12 mls, burped and was content.  Swaddled in blankets so parents could rest.  I provided supplementing guidelines and taught parents how to measure and feed with syringe.  I also showed them how the volume would increase when baby turns 48 hours old.  Parents verbalized understanding and were grateful for the suggestion.  They will call as needed for assistance during the night.    Mother will continue to pump after feeds and  do hand expression before/after feedings.  She will feed back any EBM she obtains.   Parents have no further questions/concerns.  RN updated. Maternal Data Formula Feeding for Exclusion: No Has patient been taught Hand Expression?: Yes Does the patient have breastfeeding experience prior to this delivery?: No  Feeding Feeding Type: Breast Fed Length of feed: 5 min  LATCH Score Latch: Repeated attempts needed to sustain latch, nipple held in mouth throughout feeding, stimulation needed to elicit sucking reflex.  Audible Swallowing: None  Type of Nipple: Everted at rest and after stimulation(short shafted bilaterally)  Comfort (Breast/Nipple): Soft / non-tender  Hold (Positioning): Assistance needed to correctly position infant at breast and maintain latch.  LATCH Score: 6  Interventions Interventions: Breast feeding basics reviewed;Assisted with latch;Skin to skin;Breast massage;Hand express;Position options;Support pillows;Adjust position;Breast compression;DEBP  Lactation Tools Discussed/Used Pump Review: Setup, frequency, and cleaning Initiated by:: Rojelio BrennerB. Mungaray RN  Date initiated:: 04/20/18   Consult Status Consult Status: Follow-up Date: 04/21/18 Follow-up type: In-patient    [name] 04/20/2018, 10:51 PM

## 2018-04-21 MED ORDER — LEVOTHYROXINE SODIUM 50 MCG PO TABS
50.0000 ug | ORAL_TABLET | Freq: Every day | ORAL | Status: DC
  Administered 2018-04-21: 50 ug via ORAL
  Filled 2018-04-21: qty 1

## 2018-04-21 NOTE — Lactation Note (Signed)
This note was copied from a baby's chart. Lactation Consultation Note  Patient Name: Jessica Silva WJXBJ'YToday's Date: 04/21/2018 Reason for consult: Follow-up assessment;Primapara;1st time breastfeeding;Early term 37-38.6wks;Hyperbilirubinemia;Other (Comment);Infant weight loss(baby on Double Photo , 3 % weight loss ) Baby is 6158 hours old  LC reviewed and updated the doc flow sheets.  MBURN in the room when [facility] entered for assessment, handed mom the baby and a bottle.  Baby sucked down 25 ml, LC reviewed PACE feeding, and posed baby, she burped, and  Was content. Baby attempted to latch, baby suckled for a few times and released/ no swallows. LC unable to hand express prior to latch, baby latched with depth. LC resized mom for NS and the #20 NS would not stay on ( mom reported that also) and the #24 NS was to big . LC tried the #24 due to the areola being compressible.  LC also tried re-latching without the NS and 76F SNS , and baby didn't seem interested in feeding . LC fixed one photo light blanket under baby and opn on top with baby being STS with mom.  Nurses light next to mom.  Due to the baby being on double Photo , LC feels mom needs to work on getting her milk in and be more consistent with using the DEBP 8-10 's a day for 15 -20 mins, save milk to feed back to baby.  Also LC obtained another set pf shells due to mom loosing the 1st set and instructed her on the use. LC discussed the importance of supply and demand.      Maternal Data Has patient been taught Hand Expression?: Yes  Feeding Feeding Type: Breast Fed Nipple Type: Slow - flow Length of feed: 5 min(few strong sucks / no swallows/ on and  off / see LC note )  LATCH Score Latch: Repeated attempts needed to sustain latch, nipple held in mouth throughout feeding, stimulation needed to elicit sucking reflex.  Audible Swallowing: None  Type of Nipple: Everted at rest and after stimulation(semi compressible areolas  )  Comfort (Breast/Nipple): Soft / non-tender  Hold (Positioning): Assistance needed to correctly position infant at breast and maintain latch.  LATCH Score: 6  Interventions Interventions: Breast feeding basics reviewed;Assisted with latch;Skin to skin;Breast massage;Adjust position;Support pillows;Position options  Lactation Tools Discussed/Used Tools: Shells;Pump;Supplemental Nutrition System;76F feeding tube / Syringe(LC reinstructed mom on the use shells / attempted the 5  F SNS ) Nipple shield size: 20;24;Other (comment)(LC  resized for NS , #20 did not stay aon , and the #24 NS to big ) Shell Type: Inverted Breast pump type: Double-Electric Breast Pump;Manual   Consult Status Consult Status: Follow-up Date: 04/22/18 Follow-up type: In-patient    Jessica Silva 04/21/2018, 5:35 PM

## 2018-04-21 NOTE — Progress Notes (Signed)
POD #2 Doing ok, some pain, some nausea, baby being evaluated for jaundice Afeb, VSS Abd- soft, fundus firm, neg pressure dressing in place Continue routine care, ambulate

## 2018-04-22 ENCOUNTER — Ambulatory Visit: Payer: Self-pay

## 2018-04-22 MED ORDER — OXYCODONE HCL 5 MG PO TABS: 5.0000 mg | ORAL_TABLET | Freq: Four times a day (QID) | ORAL | 0 refills | Status: DC | PRN

## 2018-04-22 MED ORDER — IBUPROFEN 800 MG PO TABS: 800.0000 mg | ORAL_TABLET | Freq: Three times a day (TID) | ORAL | 1 refills | Status: DC | PRN

## 2018-04-22 MED ORDER — PRENATAL MULTIVITAMIN CH: 1.0000 | ORAL_TABLET | Freq: Every day | ORAL | 3 refills | Status: DC

## 2018-04-22 NOTE — Progress Notes (Signed)
Subjective: Postpartum Day 3: Cesarean Delivery Patient reports incisional pain, tolerating PO and no problems voiding.    Objective: Vital signs in last 24 hours: Temp:  [98.1 F (36.7 C)-98.8 F (37.1 C)] 98.1 F (36.7 C) (08/14 0602) Pulse Rate:  [80-102] 80 (08/14 0602) Resp:  [18] 18 (08/14 0602) BP: (99-129)/(56-88) 99/56 (08/14 0602) SpO2:  [100 %] 100 % (08/14 0602)  Physical Exam:  General: alert and no distress Lochia: appropriate Uterine Fundus: firm Incision: healing well DVT Evaluation: No evidence of DVT seen on physical exam.  Recent Labs    04/20/18 0503  HGB 10.6*  HCT 29.8*    Assessment/Plan: Status post Cesarean section. Doing well postoperatively.  Discharge home with standard precautions and return to clinic in 2 weeks. D/C with motrin, percocet and PNV.  (f/u 1,2 and 6 weeks)  Maansi Wike Bovard-Stuckert 04/22/2018, 7:57 AM

## 2018-04-22 NOTE — Lactation Note (Signed)
This note was copied from a baby's chart. Lactation Consultation Note  Patient Name: Jessica Silva MVHQI'OToday's Date: 04/22/2018 Reason for consult: Follow-up assessment;Difficult latch;Hyperbilirubinemia  Visited with P1 Mom of ET infant at 7082 hrs old.  Baby on double phototherapy, bili 15.3.  Mom holding baby feeding her a bottle of formula.  Baby has been taking 50-55 ml at each feeding.   Mom hasn't pumped since yesterday, pump parts packed away.  After baby fed most of bottle, baby still rooting.  Offered to assist with a latch to breast.   Baby in football hold, and opens very wide onto breast, but fell asleep. Mom has large, heavy breasts (rolled up cloth diaper placed under breast for support), and nipple short but erect.  Baby unable to sustain a deep latch. Encouraged STS as much as possible. Baby fed remainder of bottle as she started cueing again.  Demonstrated paced bottle feeding explaining how it mimics flow of milk at the breast. Set up DEBP and reviewed initiation setting.  Flange size of 21 a better fit.  Mom encouraged to pump every 2-3 hrs to stimulate her milk supply.  Plan- 1- Offer breast at least every 3 hrs or when baby cues 2- Offer baby supplement per volume guidelines (EBM+/formula) 3- Pump both breasts on initiation setting for 15 mins 4- ask for help with latch prn  Interventions Interventions: Assisted with latch;Skin to skin;Breast massage;Hand express;Breast compression;Position options;DEBP  Lactation Tools Discussed/Used Tools: Flanges;Pump Flange Size: 21 Breast pump type: Double-Electric Breast Pump Pump Review: Setup, frequency, and cleaning;Milk Storage Initiated by:: Erby Pian Jessica Xin RN IBCLC Date initiated:: 04/22/18   Consult Status Consult Status: Follow-up Date: 04/23/18 Follow-up type: In-patient    Jessica Silva, Jessica Silva E 04/22/2018, 4:51 PM

## 2018-04-22 NOTE — Discharge Summary (Signed)
OB Discharge Summary     Patient Name: Jessica Silva DOB: [birth year] MRN: [id number]  Date of admission: 04/17/2018 Delivering MD: Sherian ReinBOVARD-STUCKERT, Aaralynn Shepheard   Date of discharge: 04/22/2018  Admitting diagnosis: INDUCTION Intrauterine pregnancy: 9264w4d     Secondary diagnosis:  Active Problems:   S/P cesarean section  Additional problems: PIH     Discharge diagnosis: Term Pregnancy Delivered                                                                                                Post partum procedures:N/A  Augmentation: AROM, Pitocin, Cytotec and Foley Balloon  Complications: None  Hospital course:  Induction of Labor With Cesarean Section  32 y.o. yo G2P1011 at 4064w4d was admitted to the hospital 04/17/2018 for induction of labor. Patient had a labor course significant for PIH,. The patient went for cesarean section due to Arrest of Descent, and delivered a Viable infant,04/19/2018  Membrane Rupture Time/Date: 3:54 PM ,04/18/2018   Details of operation can be found in separate operative Note.  Patient had an uncomplicated postpartum course. She is ambulating, tolerating a regular diet, passing flatus, and urinating well.  Patient is discharged home in stable condition on 04/22/18.                                    Physical exam  Vitals:   04/21/18 0530 04/21/18 1428 04/21/18 2247 04/22/18 0602  BP: (!) 117/59 127/88 129/72 (!) 99/56  Pulse: (!) 108 84 (!) 102 80  Resp:  18 18 18   Temp: 97.6 F (36.4 C) 98.8 F (37.1 C)  98.1 F (36.7 C)  TempSrc:  Oral  Oral  SpO2: 100% 100% 100% 100%  Weight:      Height:       General: alert and no distress Lochia: appropriate Uterine Fundus: firm Incision: Healing well with no significant drainage DVT Evaluation: No evidence of DVT seen on physical exam. Labs: Lab Results  Component Value Date   WBC 15.3 (H) 04/20/2018   HGB 10.6 (L) 04/20/2018   HCT 29.8 (L) 04/20/2018   MCV 89.2 04/20/2018   PLT 196 04/20/2018   CMP  Latest Ref Rng & Units 04/17/2018  Glucose 70 - 99 mg/dL 82  BUN 6 - 20 mg/dL 10  Creatinine 4.090.44 - 8.111.00 mg/dL 9.140.71  Sodium 782135 - 956145 mmol/L 136  Potassium 3.5 - 5.1 mmol/L 4.3  Chloride 98 - 111 mmol/L 104  CO2 22 - 32 mmol/L 20(L)  Calcium 8.9 - 10.3 mg/dL 9.5  Total Protein 6.5 - 8.1 g/dL 6.0(L)  Total Bilirubin 0.3 - 1.2 mg/dL 0.4  Alkaline Phos 38 - 126 U/L 102  AST 15 - 41 U/L 17  ALT 0 - 44 U/L 17    Discharge instruction: per After Visit Summary and "Baby and Me Booklet".  After visit meds:  Allergies as of 04/22/2018      Reactions   Bee Venom Hives   Penicillins Hives   Has patient had a PCN reaction causing  immediate rash, facial/tongue/throat swelling, SOB or lightheadedness with hypotension: unknown Has patient had a PCN reaction causing severe rash involving mucus membranes or skin necrosis: unknown Has patient had a PCN reaction that required hospitalization no Has patient had a PCN reaction occurring within the last 10 years: no If all of the above answers are "NO", then may proceed with Cephalosporin use.      Medication List    TAKE these medications   acetaminophen 325 MG tablet Commonly known as:  TYLENOL Take 325 mg by mouth every 6 (six) hours as needed for headache.   buPROPion 150 MG 24 hr tablet Commonly known as:  WELLBUTRIN XL Take 1 tablet (150 mg total) by mouth daily.   calcium carbonate 500 MG chewable tablet Commonly known as:  TUMS - dosed in mg elemental calcium Chew 1 tablet by mouth daily as needed for indigestion or heartburn.   CALCIUM-MAGNESIUM-ZINC PO Take by mouth.   EPINEPHrine 0.3 mg/0.3 mL Devi Commonly known as:  EPI-PEN Inject 0.3 mLs (0.3 mg total) into the muscle as needed.   ibuprofen 800 MG tablet Commonly known as:  ADVIL,MOTRIN Take 1 tablet (800 mg total) by mouth every 8 (eight) hours as needed.   levothyroxine 50 MCG tablet Commonly known as:  SYNTHROID, LEVOTHROID Take 50 mcg by mouth at bedtime.    oxyCODONE 5 MG immediate release tablet Commonly known as:  Oxy IR/ROXICODONE Take 1 tablet (5 mg total) by mouth every 6 (six) hours as needed for severe pain.   prenatal multivitamin Tabs tablet Take 1 tablet by mouth daily at 12 noon.   ranitidine 150 MG tablet Commonly known as:  ZANTAC Take 150 mg by mouth daily as needed for heartburn.       Diet: routine diet  Activity: Advance as tolerated. Pelvic rest for 6 weeks.   Outpatient follow up:6 weeks Follow up Appt:No future appointments. Follow up Visit:No follow-ups on file.  Postpartum contraception: Undecided  Newborn Data: Live born female  Birth Weight: 7 lb 9.3 oz (3440 g) APGAR: 8, 8  Newborn Delivery   Birth date/time:  04/19/2018 06:51:00 Delivery type:  C-Section, Vacuum Assisted Trial of labor:  No C-section categorization:  Primary     Baby Feeding: Breast Disposition:home with mother   04/22/2018 Sherian ReinJody Bovard-Stuckert, MD

## 2018-04-23 ENCOUNTER — Ambulatory Visit: Payer: Self-pay

## 2018-04-23 NOTE — Lactation Note (Signed)
This note was copied from a baby's chart. Lactation Consultation Note  Patient Name: Girl Nena JordanJena Boshers ZOXWR'UToday's Date: 04/23/2018 Reason for consult: Follow-up assessment;1st time breastfeeding;Difficult latch;Early term 37-38.6wks;Maternal endocrine disorder([place]) Type of Endocrine Disorder?: Thyroid   Follow up with mom of 4 day old Early term infant. Infant is bottle feeding formula. Infant with 6 bottle feeds of formula of 40-55 cc, 7 voids and 5 stools since birth. Infant is under double phototherapy and going home on single phototherapy. Dad told LC that it was not a good time to talk about BF as they have had a difficult morning. LC updated feeding chart and dad left the room, mom was more open to hearing d/c teaching. Room was packed up and parents ready to go home.   Mom reports she has been having a difficult time herself. She has not been putting infant to the breast as she finds it difficult. Mom reports she is pumping some and hand expressing some colostrum that she fed to infant. Mom reports she is full today but not engorged. Reviewed supply and demand and importance of emptying breasts 8-12 xin 24 hours to promote and protect milk supply. Mom reports she has a pump at home and is able to hand express.   Reviewed I/O, signs infant is getting enough at the breast, hand expression and engorgement prevention/treatment. Reviewed LC Brochure, mom aware of OP Services, BF Support Groups and LC phone #. Informed mom that her Peds office does have a LC.   Mom reports she does not need any assistance at this time. Mom to call with questions/concerns as needed.    Maternal Data Has patient been taught Hand Expression?: Yes Does the patient have breastfeeding experience prior to this delivery?: No  Feeding    LATCH Score                   Interventions    Lactation Tools Discussed/Used Pump Review: Setup, frequency, and cleaning;Milk Storage Initiated by:: Reviewed  and encouraged 8-12 x a day to protect and promote milk supply   Consult Status Consult Status: Complete Follow-up type: Call as needed    Ed BlalockSharon S Ayven Pheasant 04/23/2018, 10:46 AM

## 2018-06-05 DIAGNOSIS — Z23 Encounter for immunization: Secondary | ICD-10-CM | POA: Diagnosis not present

## 2018-06-13 ENCOUNTER — Other Ambulatory Visit: Payer: Self-pay | Admitting: Family Medicine

## 2018-06-15 NOTE — Telephone Encounter (Signed)
We do not prescribe this.

## 2018-06-15 NOTE — Telephone Encounter (Signed)
Please advise 

## 2018-07-24 ENCOUNTER — Ambulatory Visit: Payer: Self-pay | Admitting: *Deleted

## 2018-07-24 MED ORDER — OSELTAMIVIR PHOSPHATE 75 MG PO CAPS: ORAL_CAPSULE | ORAL | 0 refills | Status: DC

## 2018-07-24 NOTE — Telephone Encounter (Signed)
Pt states her husband tested positive for the flu and they have a baby in the home. Pt states she was advised to get a prophylactic to prevent her from getting the flu and spreading it to their baby.  Cb# [phone]

## 2018-07-24 NOTE — Telephone Encounter (Signed)
Rx sent to pt pharmacy 

## 2018-07-24 NOTE — Telephone Encounter (Signed)
Left message to call back to discuss request.

## 2018-07-24 NOTE — Telephone Encounter (Signed)
  Reason for Disposition . [1] Influenza EXPOSURE within last 48 hours (2 days) AND [2] NOT HIGH RISK AND [3] strongly requests antiviral medication    Infant in home  Answer Assessment - Initial Assessment Questions 1. TYPE of EXPOSURE: "How were you exposed?" (e.g., close contact, not a close contact)     Husband tested positive today 2. DATE of EXPOSURE: "When did the exposure occur?" (e.g., hour, days, weeks)     In home 3. PREGNANCY: "Is there any chance you are pregnant?" "When was your last menstrual period?"     Not asked 4. HIGH RISK for COMPLICATIONS: "Do you have any heart or lung problems? Do you have a weakened immune system?" (e.g., CHF, COPD, asthma, HIV positive, chemotherapy, renal failure, diabetes mellitus, sickle cell anemia)     no 5. SYMPTOMS: "Do you have any symptoms?" (e.g., cough, fever, sore throat, difficulty breathing).     scratchy throat, headache  Protocols used: INFLUENZA EXPOSURE-A-AH

## 2018-07-24 NOTE — Telephone Encounter (Signed)
Husband was seen in office today- tested positive for flu A. He was told wife needed treatment to prevent. Wife does have scratchy throat , runny nose and  Headache. [facility] use [facility]

## 2018-07-24 NOTE — Telephone Encounter (Signed)
Start Tamiflu 75 mg po once daily for 10 days.

## 2018-08-09 DIAGNOSIS — J019 Acute sinusitis, unspecified: Secondary | ICD-10-CM | POA: Diagnosis not present

## 2018-08-09 DIAGNOSIS — B9689 Other specified bacterial agents as the cause of diseases classified elsewhere: Secondary | ICD-10-CM | POA: Diagnosis not present

## 2018-10-01 DIAGNOSIS — F4320 Adjustment disorder, unspecified: Secondary | ICD-10-CM | POA: Diagnosis not present

## 2018-10-08 DIAGNOSIS — F4320 Adjustment disorder, unspecified: Secondary | ICD-10-CM | POA: Diagnosis not present

## 2018-10-26 ENCOUNTER — Ambulatory Visit (INDEPENDENT_AMBULATORY_CARE_PROVIDER_SITE_OTHER): Payer: BLUE CROSS/BLUE SHIELD

## 2018-10-26 ENCOUNTER — Encounter: Payer: Self-pay | Admitting: Family Medicine

## 2018-10-26 ENCOUNTER — Ambulatory Visit (INDEPENDENT_AMBULATORY_CARE_PROVIDER_SITE_OTHER): Payer: BLUE CROSS/BLUE SHIELD | Admitting: Family Medicine

## 2018-10-26 ENCOUNTER — Other Ambulatory Visit: Payer: Self-pay

## 2018-10-26 VITALS — BP 120/68 | HR 114 | Temp 98.2°F | Ht 61.0 in | Wt 263.1 lb

## 2018-10-26 DIAGNOSIS — R000 Tachycardia, unspecified: Secondary | ICD-10-CM

## 2018-10-26 DIAGNOSIS — R0609 Other forms of dyspnea: Secondary | ICD-10-CM

## 2018-10-26 DIAGNOSIS — R0600 Dyspnea, unspecified: Secondary | ICD-10-CM

## 2018-10-26 DIAGNOSIS — R0602 Shortness of breath: Secondary | ICD-10-CM | POA: Diagnosis not present

## 2018-10-26 NOTE — Patient Instructions (Signed)
We will call with lab results  Keep caffeine intake to a minimum as discussed.    Sinus Tachycardia  Sinus tachycardia is a kind of fast heartbeat. In sinus tachycardia, the heart beats more than 100 times a minute. Sinus tachycardia starts in a part of the heart called the sinus node. Sinus tachycardia may be harmless, or it may be a sign of a serious condition. What are the causes? This condition may be caused by:  Exercise or exertion.  A fever.  Pain.  Loss of body fluids (dehydration).  Severe bleeding (hemorrhage).  Anxiety and stress.  Certain substances, including: ? Alcohol. ? Caffeine. ? Tobacco and nicotine products. ? Cold medicines. ? Illegal drugs.  Medical conditions including: ? Heart disease. ? An infection. ? An overactive thyroid (hyperthyroidism). ? A lack of red blood cells (anemia). What are the signs or symptoms? Symptoms of this condition include:  A feeling that the heart is beating quickly (palpitations).  Suddenly noticing your heartbeat (cardiac awareness).  Dizziness.  Tiredness (fatigue).  Shortness of breath.  Chest pain.  Nausea.  Fainting. How is this diagnosed? This condition is diagnosed with:  A physical exam.  Other tests, such as: ? Blood tests. ? An electrocardiogram (ECG). This test measures the electrical activity of the heart. ? Ambulatory cardiac monitor. This records your heartbeats for 24 hours or more. You may be referred to a heart specialist (cardiologist). How is this treated? Treatment for this condition depends on the cause or the underlying condition. Treatment may involve:  Treating the underlying condition.  Taking new medicines or changing your current medicines as told by your health care provider.  Making changes to your diet or lifestyle. Follow these instructions at home: Lifestyle   Do not use any products that contain nicotine or tobacco, such as cigarettes and e-cigarettes. If you  need help quitting, ask your health care provider.  Do not use illegal drugs, such as cocaine.  Learn relaxation methods to help you when you get stressed or anxious. These include deep breathing.  Avoid caffeine or other stimulants. Alcohol use   Do not drink alcohol if: ? Your health care provider tells you not to drink. ? You are pregnant, may be pregnant, or are planning to become pregnant.  If you drink alcohol, limit how much you have: ? 0-1 drink a day for women. ? 0-2 drinks a day for men.  Be aware of how much alcohol is in your drink. In the [place] one drink equals one typical bottle of beer (12 oz), one-half glass of wine (5 oz), or one shot of hard liquor (1 oz). General instructions  Drink enough fluids to keep your urine pale yellow.  Take over-the-counter and prescription medicines only as told by your health care provider.  Keep all follow-up visits as told by your health care provider. This is important. Contact a health care provider if you have:  A fever.  Vomiting or diarrhea that does not go away. Get help right away if you:  Have pain in your chest, upper arms, jaw, or neck.  Become weak or dizzy.  Feel faint.  Have palpitations that do not go away. Summary  In sinus tachycardia, the heart beats more than 100 times a minute.  Sinus tachycardia may be harmless, or it may be a sign of a serious condition.  Treatment for this condition depends on the cause or the underlying condition.  Get help right away if you have pain in your  chest, upper arms, jaw, or neck. This information is not intended to replace advice given to you by your health care provider. Make sure you discuss any questions you have with your health care provider. Document Released: 10/03/2004 Document Revised: 10/15/2017 Document Reviewed: 10/15/2017 Elsevier Interactive Patient Education  2019 [employer].

## 2018-10-26 NOTE — Progress Notes (Signed)
Subjective:     Patient ID: Jessica Silva, female   DOB: 10/06/1985, 33 y.o.   MRN: [id number]  HPI Patient is seen with some elevated heart rate-above her usual baseline and some exertional dyspnea.  She noted last Thursday that her heart rate( which normally runs about 90-95) was up around 116.  This morning when she was drying her hair and not exerting at all she noted heart rate of 150 and felt somewhat lightheaded.  No recent syncope.  Minimal caffeine use.  No decongestant use.  She does take low-dose Wellbutrin.  She had her first child back in August.  Her hemoglobin prior to C-section was 13.2 and 10.6 afterwards.  She has not any follow-up labs since then.  She has noted as above some dyspnea with exertion possibly for a few weeks.  No cough.  No fever.  No pleuritic pain.  No extremity edema or calf pain. No chest pain.  Past Medical History:  Diagnosis Date  . Abnormal uterine bleeding   . Amenorrhea   . Anxiety   . Depression   . GERD (gastroesophageal reflux disease)   . Hashimoto's thyroiditis   . Hashimoto's thyroiditis   . Hormone disorder   . Infertility, female   . PONV (postoperative nausea and vomiting)   . Premature ovarian failure 2002  . Premature ovarian failure 2002  . Psychogenic air hunger    Past Surgical History:  Procedure Laterality Date  . CESAREAN SECTION N/A 04/19/2018   Procedure: CESAREAN SECTION;  Surgeon: Sherian Rein, MD;  Location: [facility];  Service: Obstetrics;  Laterality: N/A;  . DILATATION & CURETTAGE/HYSTEROSCOPY WITH MYOSURE N/A 05/07/2016   Procedure: DILATATION & CURETTAGE/HYSTEROSCOPY WITH MYOSURE;  Surgeon: Romualdo Bolk, MD;  Location: [facility];  Service: Gynecology;  Laterality: N/A;  . DILATION AND CURETTAGE OF UTERUS    . TONSILLECTOMY    . TONSILLECTOMY AND ADENOIDECTOMY    . WISDOM TOOTH EXTRACTION      reports that she has quit smoking. She has a 0.10 pack-year smoking history. She has never used  smokeless tobacco. She reports that she does not drink alcohol or use drugs. family history includes Hiatal hernia in her maternal grandmother and mother; Hypertension in her sister; Irritable bowel syndrome in her brother and brother; Polycystic ovary syndrome in her sister. Allergies  Allergen Reactions  . Bee Venom Hives  . Penicillins Hives    Has patient had a PCN reaction causing immediate rash, facial/tongue/throat swelling, SOB or lightheadedness with hypotension: unknown Has patient had a PCN reaction causing severe rash involving mucus membranes or skin necrosis: unknown Has patient had a PCN reaction that required hospitalization no Has patient had a PCN reaction occurring within the last 10 years: no If all of the above answers are "NO", then may proceed with Cephalosporin use.      Review of Systems  Constitutional: Negative for chills and fever.  Respiratory: Positive for shortness of breath. Negative for cough and wheezing.   Cardiovascular: Positive for palpitations. Negative for chest pain and leg swelling.  Neurological: Positive for light-headedness. Negative for syncope and weakness.       Objective:   Physical Exam Constitutional:      Appearance: Normal appearance.  Neck:     Musculoskeletal: Neck supple.  Cardiovascular:     Rate and Rhythm: Regular rhythm.     Comments: Palpated rate is around 105 Pulmonary:     Effort: Pulmonary effort is normal.  Breath sounds: Normal breath sounds. No wheezing or rales.  Musculoskeletal:     Right lower leg: No edema.     Left lower leg: No edema.  Neurological:     Mental Status: She is alert.        Assessment:     Patient presents with 1 week history of elevated heart rate above her usual baseline with recorded rate as high as 150 (transiently) at rest.  Also reports some increased dyspnea with minimal exertion such as climbing stairs.  No leg edema, leg pain, pleuritic pain to suggest increased  likelihood of PE.    Plan:     -Check chest x-ray and EKG.  EKG shows sinus tachycardia with no acute changes. -Obtain labs with CBC, TSH, basic metabolic panel, magnesium level -May need to consider further studies such as event monitor and echocardiogram depending on above results  Kristian Covey MD  Primary Care at [facility]

## 2018-10-27 LAB — CBC WITH DIFFERENTIAL/PLATELET
Basophils Absolute: 0 10*3/uL (ref 0.0–0.1)
Basophils Relative: 0.8 % (ref 0.0–3.0)
Eosinophils Absolute: 0.1 10*3/uL (ref 0.0–0.7)
Eosinophils Relative: 2.2 % (ref 0.0–5.0)
HCT: 41.3 % (ref 36.0–46.0)
Hemoglobin: 13.8 g/dL (ref 12.0–15.0)
Lymphocytes Relative: 41.2 % (ref 12.0–46.0)
Lymphs Abs: 2.3 10*3/uL (ref 0.7–4.0)
MCHC: 33.3 g/dL (ref 30.0–36.0)
MCV: 85.6 fl (ref 78.0–100.0)
Monocytes Absolute: 0.6 10*3/uL (ref 0.1–1.0)
Monocytes Relative: 10 % (ref 3.0–12.0)
Neutro Abs: 2.6 10*3/uL (ref 1.4–7.7)
Neutrophils Relative %: 45.8 % (ref 43.0–77.0)
Platelets: 285 10*3/uL (ref 150.0–400.0)
RBC: 4.82 Mil/uL (ref 3.87–5.11)
RDW: 11.8 % (ref 11.5–15.5)
WBC: 5.6 10*3/uL (ref 4.0–10.5)

## 2018-10-27 LAB — BASIC METABOLIC PANEL
BUN: 14 mg/dL (ref 6–23)
CO2: 28 mEq/L (ref 19–32)
Calcium: 9.2 mg/dL (ref 8.4–10.5)
Chloride: 102 mEq/L (ref 96–112)
Creatinine, Ser: 0.66 mg/dL (ref 0.40–1.20)
GFR: 103.85 mL/min (ref >60.00)
Glucose, Bld: 89 mg/dL (ref 70–99)
Potassium: 4.4 mEq/L (ref 3.5–5.1)
Sodium: 140 mEq/L (ref 135–145)

## 2018-10-27 LAB — MAGNESIUM: Magnesium: 1.9 mg/dL (ref 1.5–2.5)

## 2018-10-27 LAB — TSH: TSH: <0.01 u[IU]/mL — ABNORMAL LOW (ref 0.35–4.50)

## 2018-10-27 LAB — T4, FREE: Free T4: 2.68 ng/dL — ABNORMAL HIGH (ref 0.60–1.60)

## 2018-10-28 ENCOUNTER — Other Ambulatory Visit: Payer: Self-pay

## 2018-10-28 DIAGNOSIS — R7989 Other specified abnormal findings of blood chemistry: Secondary | ICD-10-CM

## 2018-10-28 MED ORDER — METOPROLOL SUCCINATE ER 25 MG PO TB24: 25.0000 mg | ORAL_TABLET | Freq: Every day | ORAL | 2 refills | Status: DC

## 2018-11-02 ENCOUNTER — Encounter: Payer: Self-pay | Admitting: Family Medicine

## 2018-11-05 DIAGNOSIS — F4320 Adjustment disorder, unspecified: Secondary | ICD-10-CM | POA: Diagnosis not present

## 2018-11-05 NOTE — Telephone Encounter (Signed)
I called patient on 11-04-18 to discuss.  She sees endocrinology next week.  We have instructed her that she can increase her Toprol XL up to 75 mg daily for consistent heart rate > 100.

## 2018-11-12 DIAGNOSIS — E063 Autoimmune thyroiditis: Secondary | ICD-10-CM | POA: Diagnosis not present

## 2018-11-12 DIAGNOSIS — E288 Other ovarian dysfunction: Secondary | ICD-10-CM | POA: Diagnosis not present

## 2018-11-12 DIAGNOSIS — R000 Tachycardia, unspecified: Secondary | ICD-10-CM | POA: Diagnosis not present

## 2018-11-22 ENCOUNTER — Encounter: Payer: Self-pay | Admitting: Family Medicine

## 2018-11-25 ENCOUNTER — Other Ambulatory Visit: Payer: Self-pay

## 2018-11-25 ENCOUNTER — Encounter: Payer: Self-pay | Admitting: Family Medicine

## 2018-11-25 MED ORDER — METOPROLOL SUCCINATE ER 50 MG PO TB24: 50.0000 mg | ORAL_TABLET | Freq: Two times a day (BID) | ORAL | 5 refills | Status: DC

## 2018-11-25 NOTE — Telephone Encounter (Signed)
Pt calling back to state that this 50 mg is correct. Please advise

## 2019-01-16 ENCOUNTER — Other Ambulatory Visit: Payer: Self-pay | Admitting: Family Medicine

## 2019-01-22 ENCOUNTER — Encounter: Payer: Self-pay | Admitting: Family Medicine

## 2019-01-29 ENCOUNTER — Other Ambulatory Visit: Payer: Self-pay

## 2019-01-29 ENCOUNTER — Ambulatory Visit (INDEPENDENT_AMBULATORY_CARE_PROVIDER_SITE_OTHER): Payer: BLUE CROSS/BLUE SHIELD | Admitting: Family Medicine

## 2019-01-29 DIAGNOSIS — F339 Major depressive disorder, recurrent, unspecified: Secondary | ICD-10-CM | POA: Diagnosis not present

## 2019-01-29 MED ORDER — BUPROPION HCL ER (XL) 300 MG PO TB24: 300.0000 mg | ORAL_TABLET | Freq: Every day | ORAL | 3 refills | Status: DC

## 2019-01-29 NOTE — Progress Notes (Signed)
Patient ID: Jessica Silva, female   DOB: [birth year], 33 y.o.   MRN: [id number]  This visit type was conducted due to national recommendations for restrictions regarding the COVID-19 pandemic in an effort to limit this patient's exposure and mitigate transmission in our community.   Virtual Visit via Video Note  I connected with [name] on 01/29/19 at  8:30 AM EDT by a video enabled telemedicine application and verified that I am speaking with the correct person using two identifiers.  Location patient: home Location provider:work or home office Persons participating in the virtual visit: patient, provider  I discussed the limitations of evaluation and management by telemedicine and the availability of in person appointments. The patient expressed understanding and agreed to proceed.   HPI: Patient has history of recurrent depression.  She recently had some increase in depression symptoms and started back Wellbutrin XL 150 mg once daily about 2 to 3 weeks ago.  She has seen some slight improvement in depression but would like to consider further titration.  We had discontinued Wellbutrin several months ago.  Following her pregnancy she developed some hyperthyroidism.  She had presented with weight loss and tachycardia.  We placed her on Toprol and refer to endocrinology.  Basically, they are just following her levels at this point.  She is currently on Toprol-XL 50 mg twice daily.  Her heart rate is been stable in the 80s and her weight is stable.  She is actually had a couple of pounds of weight gain in recent weeks.  No diaphoresis.  No tremors.  Her girl is 46 months old and doing well.  Patient denies any suicidal ideation.  She did have possible exposure to someone with COVID at work about a week ago.  She is had no symptoms whatsoever.  No fevers, chills, cough, dyspnea, sore throat  Past Medical History:  Diagnosis Date  . Abnormal uterine bleeding   . Amenorrhea   . Anxiety   .  Depression   . GERD (gastroesophageal reflux disease)   . Hashimoto's thyroiditis   . Hashimoto's thyroiditis   . Hormone disorder   . Infertility, female   . PONV (postoperative nausea and vomiting)   . Premature ovarian failure 2002  . Premature ovarian failure 2002  . Psychogenic air hunger    Past Surgical History:  Procedure Laterality Date  . CESAREAN SECTION N/A 04/19/2018   Procedure: CESAREAN SECTION;  Surgeon: Sherian Rein, MD;  [facility]: [facility];  Service: Obstetrics;  Laterality: N/A;  . DILATATION & CURETTAGE/HYSTEROSCOPY WITH MYOSURE N/A 05/07/2016   Procedure: DILATATION & CURETTAGE/HYSTEROSCOPY WITH MYOSURE;  Surgeon: Romualdo Bolk, MD;  Location: [facility];  Service: Gynecology;  Laterality: N/A;  . DILATION AND CURETTAGE OF UTERUS    . TONSILLECTOMY    . TONSILLECTOMY AND ADENOIDECTOMY    . WISDOM TOOTH EXTRACTION      reports that she has quit smoking. She has a 0.10 pack-year smoking history. She has never used smokeless tobacco. She reports that she does not drink alcohol or use drugs. family history includes Hiatal hernia in her maternal grandmother and mother; Hypertension in her sister; Irritable bowel syndrome in her brother and brother; Polycystic ovary syndrome in her sister. Allergies  Allergen Reactions  . Bee Venom Hives  . Penicillins Hives    Has patient had a PCN reaction causing immediate rash, facial/tongue/throat swelling, SOB or lightheadedness with hypotension: unknown Has patient had a PCN reaction causing severe rash involving mucus membranes or  skin necrosis: unknown Has patient had a PCN reaction that required hospitalization no Has patient had a PCN reaction occurring within the last 10 years: no If all of the above answers are "NO", then may proceed with Cephalosporin use.       ROS: See pertinent positives and negatives per HPI.  Past Medical History:  Diagnosis Date  . Abnormal uterine bleeding   .  Amenorrhea   . Anxiety   . Depression   . GERD (gastroesophageal reflux disease)   . Hashimoto's thyroiditis   . Hashimoto's thyroiditis   . Hormone disorder   . Infertility, female   . PONV (postoperative nausea and vomiting)   . Premature ovarian failure 2002  . Premature ovarian failure 2002  . Psychogenic air hunger     Past Surgical History:  Procedure Laterality Date  . CESAREAN SECTION N/A 04/19/2018   Procedure: CESAREAN SECTION;  Surgeon: Sherian ReinBovard-Stuckert, Jody, MD;  [facility]: [facility];  Service: Obstetrics;  Laterality: N/A;  . DILATATION & CURETTAGE/HYSTEROSCOPY WITH MYOSURE N/A 05/07/2016   Procedure: DILATATION & CURETTAGE/HYSTEROSCOPY WITH MYOSURE;  Surgeon: Romualdo BolkJill Evelyn Jertson, MD;  Location: [facility];  Service: Gynecology;  Laterality: N/A;  . DILATION AND CURETTAGE OF UTERUS    . TONSILLECTOMY    . TONSILLECTOMY AND ADENOIDECTOMY    . WISDOM TOOTH EXTRACTION      Family History  Problem Relation Age of Onset  . Hiatal hernia Mother   . Hypertension Sister   . Polycystic ovary syndrome Sister   . Irritable bowel syndrome Brother   . Hiatal hernia Maternal Grandmother   . Irritable bowel syndrome Brother     SOCIAL HX: Married.  6659-month-old daughter.  Non-smoker.   Current Outpatient Medications:  .  acetaminophen (TYLENOL) 325 MG tablet, Take 325 mg by mouth every 6 (six) hours as needed for headache., Disp: , Rfl:  .  buPROPion (WELLBUTRIN XL) 300 MG 24 hr tablet, Take 1 tablet (300 mg total) by mouth daily., Disp: 90 tablet, Rfl: 3 .  CALCIUM-MAGNESIUM-ZINC PO, Take by mouth., Disp: , Rfl:  .  ibuprofen (ADVIL,MOTRIN) 800 MG tablet, Take 1 tablet (800 mg total) by mouth every 8 (eight) hours as needed., Disp: 45 tablet, Rfl: 1 .  metoprolol succinate (TOPROL-XL) 50 MG 24 hr tablet, Take 1 tablet (50 mg total) by mouth 2 (two) times daily. Take with or immediately following a meal., Disp: 60 tablet, Rfl: 5  EXAM:  VITALS per patient if  applicable:  GENERAL: alert, oriented, appears well and in no acute distress  HEENT: atraumatic, conjunttiva clear, no obvious abnormalities on inspection of external nose and ears  NECK: normal movements of the head and neck  LUNGS: on inspection no signs of respiratory distress, breathing rate appears normal, no obvious gross SOB, gasping or wheezing  CV: no obvious cyanosis  MS: moves all visible extremities without noticeable abnormality  PSYCH/NEURO: pleasant and cooperative, no obvious depression or anxiety, speech and thought processing grossly intact  ASSESSMENT AND PLAN:  Discussed the following assessment and plan:  #1 recurrent depression -Increase Wellbutrin XL to 300 mg daily and be in touch in 2 to 3 weeks if not seeing further improvements  #2 hyperthyroidism followed by endocrinology.  Follow-up for repeat labs at their discretion     I discussed the assessment and treatment plan with the patient. The patient was provided an opportunity to ask questions and all were answered. The patient agreed with the plan and demonstrated an understanding of the  instructions.   The patient was advised to call back or seek an in-person evaluation if the symptoms worsen or if the condition fails to improve as anticipated.   Carolann Littler, MD

## 2019-02-10 ENCOUNTER — Encounter: Payer: Self-pay | Admitting: Family Medicine

## 2019-03-01 DIAGNOSIS — Z20828 Contact with and (suspected) exposure to other viral communicable diseases: Secondary | ICD-10-CM | POA: Diagnosis not present

## 2019-05-10 ENCOUNTER — Telehealth: Payer: BLUE CROSS/BLUE SHIELD | Admitting: Physician Assistant

## 2019-05-10 ENCOUNTER — Encounter: Payer: Self-pay | Admitting: Physician Assistant

## 2019-05-10 DIAGNOSIS — R509 Fever, unspecified: Secondary | ICD-10-CM

## 2019-05-10 DIAGNOSIS — Z20822 Contact with and (suspected) exposure to covid-19: Secondary | ICD-10-CM

## 2019-05-10 DIAGNOSIS — Z20828 Contact with and (suspected) exposure to other viral communicable diseases: Secondary | ICD-10-CM

## 2019-05-10 DIAGNOSIS — R0981 Nasal congestion: Secondary | ICD-10-CM

## 2019-05-10 MED ORDER — FLUTICASONE PROPIONATE 50 MCG/ACT NA SUSP: 2.0000 | Freq: Every day | NASAL | 0 refills | Status: DC

## 2019-05-10 NOTE — Progress Notes (Signed)
E-Visit for Corona Virus Screening   Your current symptoms could be consistent with the coronavirus.  Many health care providers can now test patients at their office but not all are.  [place] has multiple testing sites. For information on our COVID testing locations and hours go to [email or web address]  Please quarantine yourself while awaiting your test results.  We are enrolling you in our [facility] . Daily you will receive a questionnaire within the [facility] website. Our COVID 19 response team willl be monitoriing your responses daily.   COVID 19 Testing Locations (Monday - Friday, 8 a.m. - 3:30 p.m.) [place]: [facility] at [facility], [address], [place], [place]: [place], [place], [place], [place] (entrance off [address]) [place]: [place]. [address], [place], [place] (across from [facility] Emergency Department)    COVID-19 is a respiratory illness with symptoms that are similar to the flu. Symptoms are typically mild to moderate, but there have been cases of severe illness and death due to the virus. The following symptoms may appear 2-14 days after exposure: . Fever . Cough . Shortness of breath or difficulty breathing . Chills . Repeated shaking with chills . Muscle pain . Headache . Sore throat . New loss of taste or smell . Fatigue . Congestion or runny nose . Nausea or vomiting . Diarrhea  It is vitally important that if you feel that you have an infection such as this virus or any other virus that you stay home and away from places where you may spread it to others.  You should self-quarantine for 14 days if you have symptoms that could potentially be coronavirus or have been in close contact a with a person diagnosed with COVID-19 within the last 2 weeks. You should avoid contact with people age 67 and older.   You should wear a mask  or cloth face covering over your nose and mouth if you must be around other people or animals, including pets (even at home). Try to stay at least 6 feet away from other people. This will protect the people around you.   You may also take acetaminophen (Tylenol) as needed for fever.  I have provided a work note  I have prescribed Flonase, one spray in each nostril daily for your nasal congestion.    Reduce your risk of any infection by using the same precautions used for avoiding the common cold or flu:  Marland Kitchen Wash your hands often with soap and warm water for at least 20 seconds.  If soap and water are not readily available, use an alcohol-based hand sanitizer with at least 60% alcohol.  . If coughing or sneezing, cover your mouth and nose by coughing or sneezing into the elbow areas of your shirt or coat, into a tissue or into your sleeve (not your hands). . Avoid shaking hands with others and consider head nods or verbal greetings only. . Avoid touching your eyes, nose, or mouth with unwashed hands.  . Avoid close contact with people who are sick. . Avoid places or events with large numbers of people in one location, like concerts or sporting events. . Carefully consider travel plans you have or are making. . If you are planning any travel outside or inside the [place], visit the CDC's Travelers' Health webpage for the latest health notices. . If you have some symptoms but not all symptoms, continue to monitor at home and seek  medical attention if your symptoms worsen. . If you are having a medical emergency, call 911.  HOME CARE . Only take medications as instructed by your medical team. . Drink plenty of fluids and get plenty of rest. . A steam or ultrasonic humidifier can help if you have congestion.   GET HELP RIGHT AWAY IF YOU HAVE EMERGENCY WARNING SIGNS** FOR COVID-19. If you or someone is showing any of these signs seek emergency medical care immediately. Call 911 or proceed to your  closest emergency facility if: . You develop worsening high fever. . Trouble breathing . Bluish lips or face . Persistent pain or pressure in the chest . New confusion . Inability to wake or stay awake . You cough up blood. . Your symptoms become more severe  **This list is not all possible symptoms. Contact your medical provider for any symptoms that are sever or concerning to you.   MAKE SURE YOU   Understand these instructions.  Will watch your condition.  Will get help right away if you are not doing well or get worse.  Your e-visit answers were reviewed by a board certified advanced clinical practitioner to complete your personal care plan.  Depending on the condition, your plan could have included both over the counter or prescription medications.  If there is a problem please reply once you have received a response from your provider.  Your safety is important to us.  If you have drug allergies check your prescription carefully.    You can use MyChart to ask questions about today's visit, request a non-urgent call back, or ask for a work or school excuse for 24 hours related to this e-Visit. If it has been greater than 24 hours you will need to follow up with your provider, or enter a new e-Visit to address those concerns. You will get an e-mail in the next two days asking about your experience.  I hope that your e-visit has been valuable and will speed your recovery. Thank you for using e-visits.   I spent 5-10 minutes on review and completion of this note- Illa LevelSahar Samarah Hogle [facility]

## 2019-05-11 ENCOUNTER — Encounter (INDEPENDENT_AMBULATORY_CARE_PROVIDER_SITE_OTHER): Payer: Self-pay

## 2019-05-16 ENCOUNTER — Other Ambulatory Visit: Payer: Self-pay | Admitting: Family Medicine

## 2019-05-20 ENCOUNTER — Encounter (INDEPENDENT_AMBULATORY_CARE_PROVIDER_SITE_OTHER): Payer: Self-pay

## 2019-05-25 ENCOUNTER — Encounter: Payer: BLUE CROSS/BLUE SHIELD | Admitting: Family Medicine

## 2019-05-26 ENCOUNTER — Encounter: Payer: Self-pay | Admitting: Family Medicine

## 2019-05-26 ENCOUNTER — Other Ambulatory Visit: Payer: Self-pay

## 2019-05-26 ENCOUNTER — Ambulatory Visit (INDEPENDENT_AMBULATORY_CARE_PROVIDER_SITE_OTHER): Payer: BC Managed Care – PPO | Admitting: Family Medicine

## 2019-05-26 VITALS — BP 124/76 | HR 93 | Temp 98.0°F | Ht 60.5 in | Wt 264.9 lb

## 2019-05-26 DIAGNOSIS — Z23 Encounter for immunization: Secondary | ICD-10-CM

## 2019-05-26 DIAGNOSIS — Z0000 Encounter for general adult medical examination without abnormal findings: Secondary | ICD-10-CM

## 2019-05-26 LAB — CBC WITH DIFFERENTIAL/PLATELET
Basophils Absolute: 0 10*3/uL (ref 0.0–0.1)
Basophils Relative: 0.4 % (ref 0.0–3.0)
Eosinophils Absolute: 0.1 10*3/uL (ref 0.0–0.7)
Eosinophils Relative: 2.4 % (ref 0.0–5.0)
HCT: 40.9 % (ref 36.0–46.0)
Hemoglobin: 13.9 g/dL (ref 12.0–15.0)
Lymphocytes Relative: 39.5 % (ref 12.0–46.0)
Lymphs Abs: 2.4 10*3/uL (ref 0.7–4.0)
MCHC: 33.9 g/dL (ref 30.0–36.0)
MCV: 83.5 fl (ref 78.0–100.0)
Monocytes Absolute: 0.5 10*3/uL (ref 0.1–1.0)
Monocytes Relative: 7.6 % (ref 3.0–12.0)
Neutro Abs: 3.1 10*3/uL (ref 1.4–7.7)
Neutrophils Relative %: 50.1 % (ref 43.0–77.0)
Platelets: 261 10*3/uL (ref 150.0–400.0)
RBC: 4.9 Mil/uL (ref 3.87–5.11)
RDW: 12.2 % (ref 11.5–15.5)
WBC: 6.2 10*3/uL (ref 4.0–10.5)

## 2019-05-26 LAB — HEPATIC FUNCTION PANEL
ALT: 27 U/L (ref 0–35)
AST: 16 U/L (ref 0–37)
Albumin: 4 g/dL (ref 3.5–5.2)
Alkaline Phosphatase: 122 U/L — ABNORMAL HIGH (ref 39–117)
Bilirubin, Direct: 0.1 mg/dL (ref 0.0–0.3)
Total Bilirubin: 0.5 mg/dL (ref 0.2–1.2)
Total Protein: 6.4 g/dL (ref 6.0–8.3)

## 2019-05-26 LAB — BASIC METABOLIC PANEL
BUN: 14 mg/dL (ref 6–23)
CO2: 28 mEq/L (ref 19–32)
Calcium: 9.8 mg/dL (ref 8.4–10.5)
Chloride: 103 mEq/L (ref 96–112)
Creatinine, Ser: 0.63 mg/dL (ref 0.40–1.20)
GFR: 109.18 mL/min (ref >60.00)
Glucose, Bld: 91 mg/dL (ref 70–99)
Potassium: 4.2 mEq/L (ref 3.5–5.1)
Sodium: 140 mEq/L (ref 135–145)

## 2019-05-26 LAB — T4, FREE: Free T4: 2.52 ng/dL — ABNORMAL HIGH (ref 0.60–1.60)

## 2019-05-26 LAB — LIPID PANEL
Cholesterol: 164 mg/dL (ref 0–200)
HDL: 46.8 mg/dL (ref >39.00)
NonHDL: 116.87
Total CHOL/HDL Ratio: 3
Triglycerides: 203 mg/dL — ABNORMAL HIGH (ref 0.0–149.0)
VLDL: 40.6 mg/dL — ABNORMAL HIGH (ref 0.0–40.0)

## 2019-05-26 LAB — LDL CHOLESTEROL, DIRECT: Direct LDL: 97 mg/dL

## 2019-05-26 LAB — VITAMIN D 25 HYDROXY (VIT D DEFICIENCY, FRACTURES): VITD: 29.1 ng/mL — ABNORMAL LOW (ref 30.00–100.00)

## 2019-05-26 LAB — TSH: TSH: <0.01 u[IU]/mL — ABNORMAL LOW (ref 0.35–4.50)

## 2019-05-26 NOTE — Progress Notes (Signed)
Subjective:     Patient ID: Jessica Silva, female   DOB: [birth year], 33 y.o.   MRN: [id number]  HPI Eliezer Lofts is seen for physical exam.  She had history of some recurrent depression, obesity, thyroid disease during pregnancy.  She had some mild hyperthyroidism with tachycardia along with low TSH and elevated free T4.  We sent her to endocrinologist and they recommended observation.  She is on metoprolol.  Her heart rate is back down mostly in the 80s and 90s range.  She is had some weight gain and she thinks a lot of this is attributed to binge eating but she is concerned that her thyroid may now be hypoactive.  She has not had any recent labs.  She is overdue for that.  She needs flu vaccine.  She sees gynecologist for her Pap smears.  She had tetanus last year.  She is married has 1 child.  Non-smoker.  No regular alcohol use.  She does have some concerns about whether she may have ADD.  She states she is had some organizational difficulties and difficulty completing task.  She also is concerned about potential overlap with her binge eating and ADD.  She has never had any ADD testing but is interested in doing so.  Past Medical History:  Diagnosis Date  . Abnormal uterine bleeding   . Amenorrhea   . Anxiety   . Depression   . GERD (gastroesophageal reflux disease)   . Hashimoto's thyroiditis   . Hashimoto's thyroiditis   . Hormone disorder   . Infertility, female   . PONV (postoperative nausea and vomiting)   . Premature ovarian failure 2002  . Premature ovarian failure 2002  . Psychogenic air hunger    Past Surgical History:  Procedure Laterality Date  . CESAREAN SECTION N/A 04/19/2018   Procedure: CESAREAN SECTION;  Surgeon: Janyth Contes, MD;  Location: [facility];  Service: Obstetrics;  Laterality: N/A;  . DILATATION & CURETTAGE/HYSTEROSCOPY WITH MYOSURE N/A 05/07/2016   Procedure: DILATATION & CURETTAGE/HYSTEROSCOPY WITH MYOSURE;  Surgeon: Salvadore Dom, MD;   Location: [facility];  Service: Gynecology;  Laterality: N/A;  . DILATION AND CURETTAGE OF UTERUS    . TONSILLECTOMY    . TONSILLECTOMY AND ADENOIDECTOMY    . WISDOM TOOTH EXTRACTION      reports that she has quit smoking. She has a 0.10 pack-year smoking history. She has never used smokeless tobacco. She reports that she does not drink alcohol or use drugs. family history includes Hiatal hernia in her maternal grandmother and mother; Hypertension in her sister; Irritable bowel syndrome in her brother and brother; Polycystic ovary syndrome in her sister. Allergies  Allergen Reactions  . Bee Venom Hives  . Penicillins Hives    Has patient had a PCN reaction causing immediate rash, facial/tongue/throat swelling, SOB or lightheadedness with hypotension: unknown Has patient had a PCN reaction causing severe rash involving mucus membranes or skin necrosis: unknown Has patient had a PCN reaction that required hospitalization no Has patient had a PCN reaction occurring within the last 10 years: no If all of the above answers are "NO", then may proceed with Cephalosporin use.      Review of Systems  Constitutional: Positive for fatigue. Negative for appetite change, chills, fever and unexpected weight change.  Respiratory: Negative for cough and shortness of breath.   Cardiovascular: Negative for chest pain, palpitations and leg swelling.  Gastrointestinal: Negative for abdominal pain, nausea and vomiting.  Endocrine: Negative for polydipsia and polyuria.  Genitourinary: Negative for dysuria.  Musculoskeletal: Positive for myalgias. Negative for back pain.  Skin: Negative for rash.  Neurological: Negative for dizziness.  Hematological: Negative for adenopathy.       Objective:   Physical Exam HENT:     Right Ear: Tympanic membrane normal.     Left Ear: Tympanic membrane normal.  Eyes:     Pupils: Pupils are equal, round, and reactive to light.  Neck:     Musculoskeletal: Neck supple.      Comments: No thyromegaly Cardiovascular:     Rate and Rhythm: Normal rate and regular rhythm.     Heart sounds: No murmur. No gallop.   Pulmonary:     Effort: Pulmonary effort is normal.     Breath sounds: Normal breath sounds.  Abdominal:     Palpations: Abdomen is soft. There is no mass.     Tenderness: There is no abdominal tenderness. There is no guarding or rebound.     Hernia: No hernia is present.  Musculoskeletal:     Right lower leg: No edema.     Left lower leg: No edema.  Lymphadenopathy:     Cervical: No cervical adenopathy.  Skin:    Findings: No rash.  Neurological:     General: No focal deficit present.     Mental Status: She is alert.        Assessment:     Physical exam.  Patient had hyperthyroidism following pregnancy and endocrinologist had recommended observation.  She has not had any follow-up thyroid test since that time.  Concern for binge eating disorder and possible ADD    Plan:     -We recommend she set up appointment with our clinical psychologist Forbes CellarAllison Bray for ADD testing -If deemed appropriate, may consider trial of Vyvanse.  She has had psychotherapy for her binge eating without much improvement -Check follow-up labs including TSH and free T4.  We also added 25 hydroxy vitamin D as she has had some occasional myalgias especially in her thighs -Flu vaccine given -She plans to continue with regular GYN follow-up  Kristian CoveyBruce W  MD [facility]

## 2019-05-26 NOTE — Addendum Note (Signed)
Addended by: Anibal Henderson on: 05/26/2019 11:34 AM   Modules accepted: Orders

## 2019-07-13 DIAGNOSIS — E0590 Thyrotoxicosis, unspecified without thyrotoxic crisis or storm: Secondary | ICD-10-CM | POA: Diagnosis not present

## 2019-07-13 DIAGNOSIS — E063 Autoimmune thyroiditis: Secondary | ICD-10-CM | POA: Diagnosis not present

## 2019-07-13 DIAGNOSIS — R000 Tachycardia, unspecified: Secondary | ICD-10-CM | POA: Diagnosis not present

## 2019-07-22 ENCOUNTER — Other Ambulatory Visit: Payer: Self-pay | Admitting: Endocrinology

## 2019-07-22 ENCOUNTER — Other Ambulatory Visit (HOSPITAL_COMMUNITY): Payer: Self-pay | Admitting: Endocrinology

## 2019-07-22 DIAGNOSIS — E0590 Thyrotoxicosis, unspecified without thyrotoxic crisis or storm: Secondary | ICD-10-CM

## 2019-07-30 DIAGNOSIS — E0590 Thyrotoxicosis, unspecified without thyrotoxic crisis or storm: Secondary | ICD-10-CM | POA: Diagnosis not present

## 2019-08-10 ENCOUNTER — Other Ambulatory Visit: Payer: Self-pay

## 2019-08-10 ENCOUNTER — Encounter (HOSPITAL_COMMUNITY): Payer: Self-pay

## 2019-08-10 ENCOUNTER — Ambulatory Visit (INDEPENDENT_AMBULATORY_CARE_PROVIDER_SITE_OTHER)
Admission: RE | Admit: 2019-08-10 | Discharge: 2019-08-10 | Disposition: A | Discharge status: Home / Self Care | Payer: BC Managed Care – PPO | Source: Ambulatory Visit

## 2019-08-10 DIAGNOSIS — B349 Viral infection, unspecified: Secondary | ICD-10-CM

## 2019-08-10 DIAGNOSIS — Z20822 Contact with and (suspected) exposure to covid-19: Secondary | ICD-10-CM

## 2019-08-10 MED ORDER — BENZONATATE 100 MG PO CAPS: 100.0000 mg | ORAL_CAPSULE | Freq: Three times a day (TID) | ORAL | 0 refills | Status: DC

## 2019-08-10 NOTE — Discharge Instructions (Addendum)
Use the [employer] as directed for your cough.    Go through the [facility]  testing site.   Follow-up with your primary care provider or come here to be seen in person if your symptoms are not improving.

## 2019-08-10 NOTE — ED Provider Notes (Signed)
Virtual Visit via Video Note:  Jessica Silva  initiated request for Telemedicine visit with [facility] team. I [name]  on 08/10/2019 at 12:43 PM  for a synchronized telemedicine visit using a video enabled HIPPA compliant telemedicine application. I verified that I am speaking with Jessica Silva  using two identifiers. Mickie Bail, NP  was physically located in a [facility]gent care site and DUA MEHLER was located at a different location.   The limitations of evaluation and management by telemedicine as well as the availability of in-person appointments were discussed. Patient was informed that she  may incur a bill ( including co-pay) for this virtual visit encounter. Jessica Silva  expressed understanding and gave verbal consent to proceed with virtual visit.     History of Present Illness:Jessica Silva  is a 33 y.o. female presents for evaluation of 5 day history of productive cough with clear phlegm, nasal congestion, post-nasal drip.  Denies fever, chills, sore throat, shortness of breath, vomiting, diarrhea, rash, or other symptoms.  Treating symptoms at home with honey and tea; no medications.  LMP: denies pregnancy or breastfeeding.     Allergies  Allergen Reactions  . Bee Venom Hives  . Penicillins Hives    Has patient had a PCN reaction causing immediate rash, facial/tongue/throat swelling, SOB or lightheadedness with hypotension: unknown Has patient had a PCN reaction causing severe rash involving mucus membranes or skin necrosis: unknown Has patient had a PCN reaction that required hospitalization no Has patient had a PCN reaction occurring within the last 10 years: no If all of the above answers are "NO", then may proceed with Cephalosporin use.      Past Medical History:  Diagnosis Date  . Abnormal uterine bleeding   . Amenorrhea   . Anxiety   . Depression   . GERD (gastroesophageal reflux disease)   . Hashimoto's  thyroiditis   . Hashimoto's thyroiditis   . Hormone disorder   . Infertility, female   . PONV (postoperative nausea and vomiting)   . Premature ovarian failure 2002  . Premature ovarian failure 2002  . Psychogenic air hunger      Social History   Tobacco Use  . Smoking status: Former Smoker    Packs/day: 0.10    Years: 1.00    Pack years: 0.10  . Smokeless tobacco: Never Used  . Tobacco comment: has not smoked last 2 days  Substance Use Topics  . Alcohol use: No    Alcohol/week: 0.0 - 1.0 standard drinks    Frequency: Never    Comment: occasional  . Drug use: No        Observations/Objective: Physical Exam  VITALS: Patient denies fever. GENERAL: Alert, appears well and in no acute distress. HEENT: Atraumatic. NECK: Normal movements of the head and neck. CARDIOPULMONARY: Dry cough throughout visit.  No increased WOB. Speaking in clear sentences. I:E ratio WNL.  MS: Moves all visible extremities without noticeable abnormality. PSYCH: Pleasant and cooperative, well-groomed. Speech normal rate and rhythm. Affect is appropriate. Insight and judgement are appropriate. Attention is focused, linear, and appropriate.  NEURO: CN grossly intact. Oriented as arrived to appointment on time with no prompting. Moves both UE equally.    Assessment and Plan:    ICD-10-CM   1. Viral illness  B34.9        Follow Up Instructions: Treating cough with Tessalon Perles.  Discussed with patient that she should consider  having a COVID test.  Discussed with patient that she will need to be seen in person either here or with her primary care provider if her symptoms are not improving.  Discussed that she may need a chest x-ray and/or antibiotics if she is not improving.  Patient agrees to plan of care.    I discussed the assessment and treatment plan with the patient. The patient was provided an opportunity to ask questions and all were answered. The patient agreed with the plan and  demonstrated an understanding of the instructions.   The patient was advised to call back or seek an in-person evaluation if the symptoms worsen or if the condition fails to improve as anticipated.      Sharion Balloon, NP  08/10/2019 12:43 PM         Sharion Balloon, NP 08/10/19 1244

## 2019-08-11 ENCOUNTER — Other Ambulatory Visit (HOSPITAL_COMMUNITY): Payer: BC Managed Care – PPO

## 2019-08-12 ENCOUNTER — Telehealth: Payer: Self-pay | Admitting: Family Medicine

## 2019-08-12 ENCOUNTER — Inpatient Hospital Stay
Admission: RE | Admit: 2019-08-12 | Discharge: 2019-08-12 | Disposition: A | Discharge status: Home / Self Care | Payer: BC Managed Care – PPO | Source: Ambulatory Visit

## 2019-08-12 LAB — NOVEL CORONAVIRUS, NAA: SARS-CoV-2, NAA: NOT DETECTED

## 2019-08-12 NOTE — Telephone Encounter (Signed)
Copied from [id number]. Topic: General - Other >> Aug 12, 2019  4:24 PM Greggory Keen D wrote: Reason for CRM: pt called saying she had cone virtual visit on the 1st for a productive cough/ no fever.  She took a covid test Tuesday and it came back negative today.  The urgent care prescribed Tessalon and she does not feel that is helping a lot.  She wants to either do a virtual visit with someone or see if the office will send an antibiotic in for her.  She uses [facility] [address]       CB#  [phone]

## 2019-08-13 ENCOUNTER — Other Ambulatory Visit: Payer: Self-pay

## 2019-08-13 ENCOUNTER — Telehealth (INDEPENDENT_AMBULATORY_CARE_PROVIDER_SITE_OTHER): Payer: BC Managed Care – PPO | Admitting: Family Medicine

## 2019-08-13 DIAGNOSIS — R059 Cough, unspecified: Secondary | ICD-10-CM

## 2019-08-13 DIAGNOSIS — R05 Cough: Secondary | ICD-10-CM

## 2019-08-13 MED ORDER — CHERATUSSIN AC 100-10 MG/5ML PO SYRP: 5.0000 mL | ORAL_SOLUTION | Freq: Four times a day (QID) | ORAL | 0 refills | Status: DC | PRN

## 2019-08-13 NOTE — Telephone Encounter (Signed)
See if we can set up virtual with her at end of my schedule today.

## 2019-08-13 NOTE — Telephone Encounter (Signed)
No openings today. Please advise.

## 2019-08-13 NOTE — Telephone Encounter (Signed)
I have a virtual scheduled for 4pm today.

## 2019-08-13 NOTE — Progress Notes (Signed)
This visit type was conducted due to national recommendations for restrictions regarding the COVID-19 pandemic in an effort to limit this patient's exposure and mitigate transmission in our community.   Virtual Visit via Video Note  I [name] on 08/13/19 at  4:00 PM EST by a video enabled telemedicine application and verified that I am speaking with the correct person using two identifiers.  Location patient: home Location provider:work or home office Persons participating in the virtual visit: patient, provider  I discussed the limitations of evaluation and management by telemedicine and the availability of in person appointments. The patient expressed understanding and agreed to proceed.   HPI: Jessica Silva is non-smoker who is seen with increased cough.  She has had symptoms now for about a week.  She did a virtual visit through urgent care earlier this week and was prescribed Tessalon.  This did not help much and she has had fairly severe daytime and nighttime cough.  She states her daughter who is 62 months old attends daycare and has recently had ear infection and some nasal congestion.  Jessica Silva went for Covid testing earlier this week and this came back negative.  She has no wheezing.  No major dyspnea.  No fever.  Cough productive of yellow sputum.  No hemoptysis.   ROS: See pertinent positives and negatives per HPI.  Past Medical History:  Diagnosis Date  . Abnormal uterine bleeding   . Amenorrhea   . Anxiety   . Depression   . GERD (gastroesophageal reflux disease)   . Hashimoto's thyroiditis   . Hashimoto's thyroiditis   . Hormone disorder   . Infertility, female   . PONV (postoperative nausea and vomiting)   . Premature ovarian failure 2002  . Premature ovarian failure 2002  . Psychogenic air hunger     Past Surgical History:  Procedure Laterality Date  . CESAREAN SECTION N/A 04/19/2018   Procedure: CESAREAN SECTION;  Surgeon: Sherian Rein, MD;   Location: [facility];  Service: Obstetrics;  Laterality: N/A;  . DILATATION & CURETTAGE/HYSTEROSCOPY WITH MYOSURE N/A 05/07/2016   Procedure: DILATATION & CURETTAGE/HYSTEROSCOPY WITH MYOSURE;  Surgeon: Romualdo Bolk, MD;  Location: [facility];  Service: Gynecology;  Laterality: N/A;  . DILATION AND CURETTAGE OF UTERUS    . TONSILLECTOMY    . TONSILLECTOMY AND ADENOIDECTOMY    . WISDOM TOOTH EXTRACTION      Family History  Problem Relation Age of Onset  . Hiatal hernia Mother   . Hypertension Sister   . Polycystic ovary syndrome Sister   . Irritable bowel syndrome Brother   . Hiatal hernia Maternal Grandmother   . Irritable bowel syndrome Brother     SOCIAL HX: Non-smoker.  Married with 55-month-old female   Current Outpatient Medications:  .  acetaminophen (TYLENOL) 325 MG tablet, Take 325 mg by mouth every 6 (six) hours as needed for headache., Disp: , Rfl:  .  benzonatate (TESSALON) 100 MG capsule, Take 1 capsule (100 mg total) by mouth every 8 (eight) hours., Disp: 21 capsule, Rfl: 0 .  buPROPion (WELLBUTRIN XL) 300 MG 24 hr tablet, Take 1 tablet (300 mg total) by mouth daily., Disp: 90 tablet, Rfl: 3 .  fluticasone (FLONASE) 50 MCG/ACT nasal spray, Place 2 sprays into both nostrils daily., Disp: 16 g, Rfl: 0 .  guaiFENesin-codeine (CHERATUSSIN AC) 100-10 MG/5ML syrup, Take 5 mLs by mouth 4 (four) times daily as needed for cough., Disp: 120 mL, Rfl: 0 .  ibuprofen (ADVIL,MOTRIN) 800 MG  tablet, Take 1 tablet (800 mg total) by mouth every 8 (eight) hours as needed., Disp: 45 tablet, Rfl: 1 .  metoprolol succinate (TOPROL-XL) 50 MG 24 hr tablet, TAKE 1 TABLET BY MOUTH TWICE A DAY WITH OR IMMEDIATELY FOLLOWING MEALS, Disp: 180 tablet, Rfl: 1  EXAM:  VITALS per patient if applicable:  GENERAL: alert, oriented, appears well and in no acute distress  HEENT: atraumatic, conjunttiva clear, no obvious abnormalities on inspection of external nose and ears  NECK: normal  movements of the head and neck  LUNGS: on inspection no signs of respiratory distress, breathing rate appears normal, no obvious gross SOB, gasping or wheezing  CV: no obvious cyanosis  MS: moves all visible extremities without noticeable abnormality  PSYCH/NEURO: pleasant and cooperative, no obvious depression or anxiety, speech and thought processing grossly intact  ASSESSMENT AND PLAN:  Discussed the following assessment and plan:  Cough.  Recent Covid testing negative.  Suspect acute viral bronchitis  -Cheratussin AC 1 teaspoon every 4-6 hours as needed for cough as she has been on Tessalon without much improvement -Follow-up immediately for any fever or other concerns    I discussed the assessment and treatment plan with the patient. The patient was provided an opportunity to ask questions and all were answered. The patient agreed with the plan and demonstrated an understanding of the instructions.   The patient was advised to call back or seek an in-person evaluation if the symptoms worsen or if the condition fails to improve as anticipated.    Carolann Littler, MD

## 2019-08-16 ENCOUNTER — Encounter: Payer: Self-pay | Admitting: Family Medicine

## 2019-08-16 ENCOUNTER — Other Ambulatory Visit: Payer: Self-pay

## 2019-08-16 MED ORDER — AZITHROMYCIN 250 MG PO TABS: ORAL_TABLET | ORAL | 0 refills | Status: DC

## 2019-08-26 ENCOUNTER — Encounter (HOSPITAL_COMMUNITY)
Admission: RE | Admit: 2019-08-26 | Discharge: 2019-08-26 | Disposition: A | Discharge status: Home / Self Care | Payer: BC Managed Care – PPO | Source: Ambulatory Visit | Attending: Endocrinology | Admitting: Endocrinology

## 2019-08-26 ENCOUNTER — Other Ambulatory Visit: Payer: Self-pay

## 2019-08-26 DIAGNOSIS — E0590 Thyrotoxicosis, unspecified without thyrotoxic crisis or storm: Secondary | ICD-10-CM

## 2019-08-26 MED ORDER — SODIUM IODIDE I-123 7.4 MBQ CAPS
432.0000 | ORAL_CAPSULE | Freq: Once | ORAL | Status: AC
  Administered 2019-08-26: 432 via ORAL

## 2019-08-27 ENCOUNTER — Encounter (HOSPITAL_COMMUNITY)
Admission: RE | Admit: 2019-08-27 | Discharge: 2019-08-27 | Disposition: A | Discharge status: Home / Self Care | Payer: BC Managed Care – PPO | Source: Ambulatory Visit | Attending: Endocrinology | Admitting: Endocrinology

## 2019-08-27 DIAGNOSIS — E0590 Thyrotoxicosis, unspecified without thyrotoxic crisis or storm: Secondary | ICD-10-CM | POA: Diagnosis not present

## 2019-08-31 ENCOUNTER — Encounter: Payer: Self-pay | Admitting: Family Medicine

## 2019-08-31 MED ORDER — CHERATUSSIN AC 100-10 MG/5ML PO SYRP: 5.0000 mL | ORAL_SOLUTION | Freq: Four times a day (QID) | ORAL | 0 refills | Status: DC | PRN

## 2019-08-31 NOTE — Telephone Encounter (Signed)
I refilled cough med once. Try to avoid regular use.  Will need to evaluate if cough not improving over the next couple of weeks.

## 2019-09-09 DIAGNOSIS — E0500 Thyrotoxicosis with diffuse goiter without thyrotoxic crisis or storm: Secondary | ICD-10-CM | POA: Diagnosis not present

## 2019-09-17 ENCOUNTER — Other Ambulatory Visit (HOSPITAL_COMMUNITY): Payer: Self-pay | Admitting: Endocrinology

## 2019-09-17 DIAGNOSIS — E0500 Thyrotoxicosis with diffuse goiter without thyrotoxic crisis or storm: Secondary | ICD-10-CM

## 2019-09-24 ENCOUNTER — Other Ambulatory Visit: Payer: Self-pay

## 2019-09-24 ENCOUNTER — Ambulatory Visit (HOSPITAL_COMMUNITY)
Admission: RE | Admit: 2019-09-24 | Discharge: 2019-09-24 | Disposition: A | Discharge status: Home / Self Care | Payer: BC Managed Care – PPO | Source: Ambulatory Visit | Attending: Endocrinology | Admitting: Endocrinology

## 2019-09-24 DIAGNOSIS — E0500 Thyrotoxicosis with diffuse goiter without thyrotoxic crisis or storm: Secondary | ICD-10-CM

## 2019-09-24 LAB — HCG, SERUM, QUALITATIVE: Preg, Serum: NEGATIVE

## 2019-09-24 MED ORDER — SODIUM IODIDE I 131 CAPSULE
13.5500 | Freq: Once | INTRAVENOUS | Status: AC
  Administered 2019-09-24: 13.55 via ORAL

## 2019-09-27 DIAGNOSIS — R000 Tachycardia, unspecified: Secondary | ICD-10-CM | POA: Diagnosis not present

## 2019-09-27 DIAGNOSIS — E0590 Thyrotoxicosis, unspecified without thyrotoxic crisis or storm: Secondary | ICD-10-CM | POA: Diagnosis not present

## 2019-09-27 DIAGNOSIS — E0500 Thyrotoxicosis with diffuse goiter without thyrotoxic crisis or storm: Secondary | ICD-10-CM | POA: Diagnosis not present

## 2019-10-22 ENCOUNTER — Other Ambulatory Visit: Payer: Self-pay

## 2019-10-22 ENCOUNTER — Encounter (HOSPITAL_COMMUNITY): Payer: Self-pay

## 2019-10-22 ENCOUNTER — Encounter: Payer: Self-pay | Admitting: Family Medicine

## 2019-10-22 ENCOUNTER — Emergency Department (HOSPITAL_COMMUNITY): Payer: BC Managed Care – PPO

## 2019-10-22 ENCOUNTER — Emergency Department (HOSPITAL_COMMUNITY)
Admission: EM | Admit: 2019-10-22 | Discharge: 2019-10-22 | Disposition: A | Discharge status: Home / Self Care | Payer: BC Managed Care – PPO | Attending: Emergency Medicine | Admitting: Emergency Medicine

## 2019-10-22 DIAGNOSIS — E0580 Other thyrotoxicosis without thyrotoxic crisis or storm: Secondary | ICD-10-CM | POA: Insufficient documentation

## 2019-10-22 DIAGNOSIS — E063 Autoimmune thyroiditis: Secondary | ICD-10-CM | POA: Diagnosis not present

## 2019-10-22 DIAGNOSIS — R000 Tachycardia, unspecified: Secondary | ICD-10-CM | POA: Diagnosis not present

## 2019-10-22 DIAGNOSIS — E0590 Thyrotoxicosis, unspecified without thyrotoxic crisis or storm: Secondary | ICD-10-CM

## 2019-10-22 DIAGNOSIS — Z87891 Personal history of nicotine dependence: Secondary | ICD-10-CM | POA: Insufficient documentation

## 2019-10-22 DIAGNOSIS — Z79899 Other long term (current) drug therapy: Secondary | ICD-10-CM | POA: Diagnosis not present

## 2019-10-22 LAB — BASIC METABOLIC PANEL
Anion gap: 10 (ref 5–15)
BUN: 16 mg/dL (ref 6–20)
CO2: 25 mmol/L (ref 22–32)
Calcium: 9.4 mg/dL (ref 8.9–10.3)
Chloride: 106 mmol/L (ref 98–111)
Creatinine, Ser: 0.72 mg/dL (ref 0.44–1.00)
GFR calc Af Amer: >60 mL/min (ref >60)
GFR calc non Af Amer: >60 mL/min (ref >60)
Glucose, Bld: 107 mg/dL — ABNORMAL HIGH (ref 70–99)
Potassium: 3.9 mmol/L (ref 3.5–5.1)
Sodium: 141 mmol/L (ref 135–145)

## 2019-10-22 LAB — I-STAT BETA HCG BLOOD, ED (MC, WL, AP ONLY): I-stat hCG, quantitative: <5 m[IU]/mL (ref <5)

## 2019-10-22 LAB — CBC
HCT: 42.6 % (ref 36.0–46.0)
Hemoglobin: 14.3 g/dL (ref 12.0–15.0)
MCH: 29.4 pg (ref 26.0–34.0)
MCHC: 33.6 g/dL (ref 30.0–36.0)
MCV: 87.7 fL (ref 80.0–100.0)
Platelets: 225 10*3/uL (ref 150–400)
RBC: 4.86 MIL/uL (ref 3.87–5.11)
RDW: 11.9 % (ref 11.5–15.5)
WBC: 4.8 10*3/uL (ref 4.0–10.5)
nRBC: 0 % (ref 0.0–0.2)

## 2019-10-22 LAB — TROPONIN I (HIGH SENSITIVITY): Troponin I (High Sensitivity): <2 ng/L (ref <18)

## 2019-10-22 LAB — TSH: TSH: <0.01 u[IU]/mL — ABNORMAL LOW (ref 0.350–4.500)

## 2019-10-22 MED ORDER — SODIUM CHLORIDE 0.9% FLUSH
3.0000 mL | Freq: Once | INTRAVENOUS | Status: AC
  Administered 2019-10-22: 12:00:00 3 mL via INTRAVENOUS

## 2019-10-22 MED ORDER — SODIUM CHLORIDE 0.9 % IV BOLUS
1000.0000 mL | Freq: Once | INTRAVENOUS | Status: AC
  Administered 2019-10-22: 12:00:00 1000 mL via INTRAVENOUS

## 2019-10-22 MED ORDER — METOPROLOL TARTRATE 5 MG/5ML IV SOLN
2.5000 mg | Freq: Once | INTRAVENOUS | Status: AC
  Administered 2019-10-22: 2.5 mg via INTRAVENOUS
  Filled 2019-10-22: qty 5

## 2019-10-22 MED ORDER — LORAZEPAM 2 MG/ML IJ SOLN
1.0000 mg | Freq: Once | INTRAMUSCULAR | Status: AC
  Administered 2019-10-22: 14:00:00 1 mg via INTRAVENOUS
  Filled 2019-10-22: qty 1

## 2019-10-22 MED ORDER — METOPROLOL TARTRATE 5 MG/5ML IV SOLN
2.5000 mg | Freq: Once | INTRAVENOUS | Status: AC
  Administered 2019-10-22: 14:00:00 2.5 mg via INTRAVENOUS
  Filled 2019-10-22: qty 5

## 2019-10-22 MED ORDER — METHIMAZOLE 10 MG PO TABS: 10.0000 mg | ORAL_TABLET | Freq: Every day | ORAL | 0 refills | Status: DC

## 2019-10-22 MED ORDER — METOPROLOL SUCCINATE ER 50 MG PO TB24: 100.0000 mg | ORAL_TABLET | Freq: Two times a day (BID) | ORAL | 1 refills | Status: DC

## 2019-10-22 NOTE — Discharge Instructions (Signed)
As we discussed, per endocrinologist instructions, you will be started on methimazole 10 mg a day.  Additionally, she would like to increase your metoprolol to 100 mg 2 times a day.  Please monitor closely and if you feel like you are lightheaded, feeling weak or when you pass out, dizzy, you may decrease by 1 pill at night.  Please call her office and arrange for an appointment for follow-up.  Return the emergency department for any chest pain, worsening palpitations, feeling lightheaded, dizzy.

## 2019-10-22 NOTE — ED Triage Notes (Signed)
Patient reports that she has been having tachycardia x 2 days. Patient states she called her PCP and was told if HR stayed over 100 to come to the ED and  Was 115 when she decided to come to the ED. Patient used her Apple watch.

## 2019-10-22 NOTE — ED Provider Notes (Signed)
[facility]-EMERGENCY DEPT Provider Note   CSN: [id number] Arrival date & time: 10/22/19  1110     History Chief Complaint  Patient presents with  . Tachycardia    Jessica Silva is a 34 y.o. female past medical history of anxiety, depression, GERD, Hashimoto's thyroiditis, hormone disorder, hypothyroidism who presents for evaluation of tachycardia that has been intermittently occurring for the last 2 days.  She states that she had had a heart rate previously when dealing with thyroid issues.  She had a radioactive treatment start her metoprolol 50 mg twice daily which has improved her symptoms.  She states that about 2 days ago, she noticed her heart rate was high but manageable.  She states that it was ranging between 102-110 the area.  She states that over the next 2 days, it kept increasing.  She states she noticed on her apple watch that it was fluctuating between like 115-20.  This morning, she states that it was consistently in the 115-125 and she called her primary care doctor who suggested doubling her metoprolol dose this morning.  She states that when the symptoms persisted, she came to the emergency department.  No associated chest pain, difficulty breathing.  She states she just feels like her heart is pounding quickly.  She states this is what it feels like previously when she had issues with her thyroid.  She has not been sick recently and denies any fevers, cough chills.  She denies any nausea/vomiting. She denies any OCP use, recent immobilization, prior history of DVT/PE, recent surgery, leg swelling, or long travel.  The history is provided by the patient.       Past Medical History:  Diagnosis Date  . Abnormal uterine bleeding   . Amenorrhea   . Anxiety   . Depression   . GERD (gastroesophageal reflux disease)   . Hashimoto's thyroiditis   . Hashimoto's thyroiditis   . Hormone disorder   . Infertility, female   . PONV (postoperative nausea and  vomiting)   . Premature ovarian failure 2002  . Premature ovarian failure 2002  . Psychogenic air hunger     Patient Active Problem List   Diagnosis Date Noted  . S/P cesarean section 04/19/2018  . Pregnant 04/17/2018  . Thyroid disease during pregnancy in second trimester   . [redacted] weeks gestation of pregnancy   . Obesity complicating pregnancy in second trimester   . Encounter for other antenatal screening follow-up   . 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 Depression, recurrent (HCC) 07/17/2015  . Weight gain 07/14/2013    Past Surgical History:  Procedure Laterality Date  . CESAREAN SECTION N/A 04/19/2018   Procedure: CESAREAN SECTION;  Surgeon: Sherian Rein, MD;  Location: [facility];  Service: Obstetrics;  Laterality: N/A;  . DILATATION & CURETTAGE/HYSTEROSCOPY WITH MYOSURE N/A 05/07/2016   Procedure: DILATATION & CURETTAGE/HYSTEROSCOPY WITH MYOSURE;  Surgeon: Romualdo Bolk, MD;  Location: [facility];  Service: Gynecology;  Laterality: N/A;  . DILATION AND CURETTAGE OF UTERUS    . radioactive iodine treatment     thyroid  . TONSILLECTOMY    . TONSILLECTOMY AND ADENOIDECTOMY    . WISDOM TOOTH EXTRACTION       OB History    Gravida  2   Para  1   Term  1   Preterm  0   AB  1   Living  1     SAB  1   TAB  0  Ectopic  0   Multiple  0   Live Births  1           Family History  Problem Relation Age of Onset  . Hiatal hernia Mother   . Atrial fibrillation Father   . Hypertension Sister   . Polycystic ovary syndrome Sister   . Irritable bowel syndrome Brother   . Hiatal hernia Maternal Grandmother   . Irritable bowel syndrome Brother     Social History   Tobacco Use  . Smoking status: Former Smoker    Packs/day: 0.10    Years: 1.00    Pack years: 0.10    Types: Cigarettes  . Smokeless tobacco: Never Used  Substance Use Topics  . Alcohol use: Yes    Alcohol/week: 0.0 - 1.0 standard drinks    Comment: occasional  .  Drug use: No    Home Medications Prior to Admission medications   Medication Sig Start Date End Date Taking? Authorizing Provider  acetaminophen (TYLENOL) 325 MG tablet Take 325 mg by mouth every 6 (six) hours as needed for headache.    [provider]  azithromycin (ZITHROMAX) 250 MG tablet Take 2 tabs today, then 1 tab daily for 4 days 08/16/19   Burchette, Elberta Fortis, MD  benzonatate (TESSALON) 100 MG capsule Take 1 capsule (100 mg total) by mouth every 8 (eight) hours. 08/10/19   Mickie Bail, NP  buPROPion (WELLBUTRIN XL) 300 MG 24 hr tablet Take 1 tablet (300 mg total) by mouth daily. 01/29/19   Burchette, Elberta Fortis, MD  fluticasone (FLONASE) 50 MCG/ACT nasal spray Place 2 sprays into both nostrils daily. 05/10/19   Illa Level M, PA-C  guaiFENesin-codeine (CHERATUSSIN AC) 100-10 MG/5ML syrup Take 5 mLs by mouth 4 (four) times daily as needed for cough. 08/31/19   Burchette, Elberta Fortis, MD  ibuprofen (ADVIL,MOTRIN) 800 MG tablet Take 1 tablet (800 mg total) by mouth every 8 (eight) hours as needed. 04/22/18   Bovard-Stuckert, Augusto Gamble, MD  methimazole (TAPAZOLE) 10 MG tablet Take 1 tablet (10 mg total) by mouth daily. 10/22/19 11/21/19  Maxwell Caul, PA-C  metoprolol succinate (TOPROL-XL) 50 MG 24 hr tablet Take 2 tablets (100 mg total) by mouth in the morning and at bedtime. Take with or immediately following a meal. 10/22/19   Graciella Freer A, PA-C    Allergies    Bee venom and Penicillins  Review of Systems   Review of Systems  Constitutional: Negative for fever.  Respiratory: Negative for cough and shortness of breath.   Cardiovascular: Positive for palpitations. Negative for chest pain.  Gastrointestinal: Negative for abdominal pain, nausea and vomiting.  Genitourinary: Negative for dysuria and hematuria.  Neurological: Negative for headaches.  All other systems reviewed and are negative.   Physical Exam Updated Vital Signs BP 140/69   Pulse (!) 111   Temp 98.7 F (37.1  C) (Oral)   Resp (!) 24   Ht 5\' 1"  (1.549 m)   Wt 120.2 kg   SpO2 99%   BMI 50.07 kg/m   Physical Exam Vitals and nursing note reviewed.  Constitutional:      Appearance: Normal appearance. She is well-developed.     Comments: Sitting comfortably on examination table  HENT:     Head: Normocephalic and atraumatic.  Eyes:     General: Lids are normal.     Conjunctiva/sclera: Conjunctivae normal.     Pupils: Pupils are equal, round, and reactive to light.  Cardiovascular:  Rate and Rhythm: Regular rhythm. Tachycardia present.     Pulses: Normal pulses.     Heart sounds: Normal heart sounds. No murmur. No friction rub. No gallop.   Pulmonary:     Effort: Pulmonary effort is normal.     Breath sounds: Normal breath sounds.     Comments: Lungs clear to auscultation bilaterally.  Symmetric chest rise.  No wheezing, rales, rhonchi. Abdominal:     Palpations: Abdomen is soft. Abdomen is not rigid.     Tenderness: There is no abdominal tenderness. There is no guarding.  Musculoskeletal:        General: Normal range of motion.     Cervical back: Full passive range of motion without pain.  Skin:    General: Skin is warm and dry.     Capillary Refill: Capillary refill takes less than 2 seconds.  Neurological:     Mental Status: She is alert and oriented to person, place, and time.  Psychiatric:        Speech: Speech normal.     ED Results / Procedures / Treatments   Labs (all labs ordered are listed, but only abnormal results are displayed) Labs Reviewed  BASIC METABOLIC PANEL - Abnormal; Notable for the following components:      Result Value   Glucose, Bld 107 (*)    All other components within normal limits  TSH - Abnormal; Notable for the following components:   TSH <0.010 (*)    All other components within normal limits  CBC  I-STAT BETA HCG BLOOD, ED ([facility], [facility], [facility] ONLY)  TROPONIN I (HIGH SENSITIVITY)    EKG EKG Interpretation  Date/Time:  Friday October 22 2019 11:16:45 EST Ventricular Rate:  118 PR Interval:    QRS Duration: 81 QT Interval:  307 QTC Calculation: 431 R Axis:   22 Text Interpretation: Sinus tachycardia Low voltage, precordial leads Baseline wander in lead(s) II III aVF V5 No old tracing to compare Confirmed by Sherwood Gambler [id number]) on 10/22/2019 11:23:43 AM   Radiology DG Chest 2 View  Result Date: 10/22/2019 CLINICAL DATA:  Per pt, tachycardia for last 2 days, here per pcp, no other chest complaintsTachycardia EXAM: CHEST - 2 VIEW COMPARISON:  None. FINDINGS: Normal cardiac silhouette. Subtle patchy airspace disease in the RIGHT lower lobe. No pleural fluid. No pneumothorax. IMPRESSION: Subtle airspace disease in the RIGHT lower lobe could represent edema or early pneumonia. Electronically Signed   By: Suzy Bouchard M.D.   On: 10/22/2019 13:19    Procedures Procedures (including critical care time)  Medications Ordered in ED Medications  sodium chloride flush (NS) 0.9 % injection 3 mL (3 mLs Intravenous Given 10/22/19 1152)  sodium chloride 0.9 % bolus 1,000 mL (0 mLs Intravenous Stopped 10/22/19 1336)  LORazepam (ATIVAN) injection 1 mg (1 mg Intravenous Given 10/22/19 1333)  metoprolol tartrate (LOPRESSOR) injection 2.5 mg (2.5 mg Intravenous Given 10/22/19 1335)  metoprolol tartrate (LOPRESSOR) injection 2.5 mg (2.5 mg Intravenous Given 10/22/19 1424)    ED Course  I have reviewed the triage vital signs and the nursing notes.  Pertinent labs & imaging results that were available during my care of the patient were reviewed by me and considered in my medical decision making (see chart for details).    MDM Rules/Calculators/A&P                      34 year old female with past history of GERD, thyroid issues who presents for evaluation of  palpitations, tachycardia that have intermittently been occurring for the last 2 days.  She states that today, her doctor told her to bump up her metoprolol and she did but at  lunch, was still consistently having a heart rate in the 115's.  Denies any associated chest pain, difficulty breathing.  On initial ED arrival, she is afebrile, nontoxic-appearing.  She is tachycardic with rate into the 118's.  Vitals otherwise stable.  She has no PE risk factors and does not have any chest pain, difficulty breathing.  Do not suspect PE at this time.  Suspect that this may be thyroid related given her history.  She states this is how she presented previously when she had thyroid issues.  She recently had radioactive iodine procedure and had helped it.  We will plan to check labs.  I-STAT beta negative.  BMP is unremarkable.  Troponin negative.  CBC without any significant leukocytosis or anemia.  Chest x-ray shows subtle airspace disease in right lower lobe that could represent edema or pneumonia.  Patient with no fever, cough, no leukocytosis.  Do not feel this reflects infectious etiology.  Her TSH is less than 0.010.  Her heart rate has gone down into the low 100s after metoprolol and fluids.  We will plan to consult her endocrinologist.  Discussed patient with Dr. Dorisann Frames (Endocrinology) regarding patient's work-up here.  She recommends increasing patient's metoprolol to 100 mg twice daily and starting her on methimazole 10 mg daily.  Her help.  She will plan to follow-up with patient in the office.  Updated patient on plan.  She is agreeable.  Instructed her to follow-up with her endocrinologist as directed. At this time, patient exhibits no emergent life-threatening condition that require further evaluation in ED or admission. Discussed patient with Dr. Criss Alvine who is agreeable to plan. Patient had ample opportunity for questions and discussion. All patient's questions were answered with full understanding. Strict return precautions discussed. Patient expresses understanding and agreement to plan.   Portions of this note were generated with Scientist, clinical (histocompatibility and immunogenetics). Dictation  errors may occur despite best attempts at proofreading.  Final Clinical Impression(s) / ED Diagnoses Final diagnoses:  Tachycardia  Hyperthyroidism    Rx / DC Orders ED Discharge Orders         Ordered    methimazole (TAPAZOLE) 10 MG tablet  Daily     10/22/19 1511    metoprolol succinate (TOPROL-XL) 50 MG 24 hr tablet  2 times daily     10/22/19 1511           Rosana Hoes 10/22/19 1658    Pricilla Loveless, MD 10/23/19 [phone]

## 2019-10-23 DIAGNOSIS — Z20828 Contact with and (suspected) exposure to other viral communicable diseases: Secondary | ICD-10-CM | POA: Diagnosis not present

## 2019-10-25 DIAGNOSIS — R5383 Other fatigue: Secondary | ICD-10-CM | POA: Diagnosis not present

## 2019-10-25 DIAGNOSIS — Z03818 Encounter for observation for suspected exposure to other biological agents ruled out: Secondary | ICD-10-CM | POA: Diagnosis not present

## 2019-10-25 DIAGNOSIS — M791 Myalgia, unspecified site: Secondary | ICD-10-CM | POA: Diagnosis not present

## 2019-10-25 DIAGNOSIS — Z20828 Contact with and (suspected) exposure to other viral communicable diseases: Secondary | ICD-10-CM | POA: Diagnosis not present

## 2019-11-01 DIAGNOSIS — E0590 Thyrotoxicosis, unspecified without thyrotoxic crisis or storm: Secondary | ICD-10-CM | POA: Diagnosis not present

## 2019-11-01 DIAGNOSIS — E0500 Thyrotoxicosis with diffuse goiter without thyrotoxic crisis or storm: Secondary | ICD-10-CM | POA: Diagnosis not present

## 2019-11-05 ENCOUNTER — Ambulatory Visit: Payer: BC Managed Care – PPO

## 2019-11-30 DIAGNOSIS — E0590 Thyrotoxicosis, unspecified without thyrotoxic crisis or storm: Secondary | ICD-10-CM | POA: Diagnosis not present

## 2020-01-07 DIAGNOSIS — R946 Abnormal results of thyroid function studies: Secondary | ICD-10-CM | POA: Diagnosis not present

## 2020-01-07 DIAGNOSIS — E0590 Thyrotoxicosis, unspecified without thyrotoxic crisis or storm: Secondary | ICD-10-CM | POA: Diagnosis not present

## 2020-02-03 ENCOUNTER — Other Ambulatory Visit: Payer: Self-pay | Admitting: Family Medicine

## 2020-02-15 DIAGNOSIS — F4310 Post-traumatic stress disorder, unspecified: Secondary | ICD-10-CM | POA: Diagnosis not present

## 2020-02-25 DIAGNOSIS — E0590 Thyrotoxicosis, unspecified without thyrotoxic crisis or storm: Secondary | ICD-10-CM | POA: Diagnosis not present

## 2020-02-28 DIAGNOSIS — E288 Other ovarian dysfunction: Secondary | ICD-10-CM | POA: Diagnosis not present

## 2020-02-28 DIAGNOSIS — R000 Tachycardia, unspecified: Secondary | ICD-10-CM | POA: Diagnosis not present

## 2020-02-28 DIAGNOSIS — E890 Postprocedural hypothyroidism: Secondary | ICD-10-CM | POA: Diagnosis not present

## 2020-03-01 DIAGNOSIS — F4310 Post-traumatic stress disorder, unspecified: Secondary | ICD-10-CM | POA: Diagnosis not present

## 2020-03-08 DIAGNOSIS — F4310 Post-traumatic stress disorder, unspecified: Secondary | ICD-10-CM | POA: Diagnosis not present

## 2020-03-22 DIAGNOSIS — F4310 Post-traumatic stress disorder, unspecified: Secondary | ICD-10-CM | POA: Diagnosis not present

## 2020-04-05 DIAGNOSIS — F4310 Post-traumatic stress disorder, unspecified: Secondary | ICD-10-CM | POA: Diagnosis not present

## 2020-04-10 DIAGNOSIS — E069 Thyroiditis, unspecified: Secondary | ICD-10-CM | POA: Diagnosis not present

## 2020-04-10 DIAGNOSIS — E890 Postprocedural hypothyroidism: Secondary | ICD-10-CM | POA: Diagnosis not present

## 2020-04-10 DIAGNOSIS — E0590 Thyrotoxicosis, unspecified without thyrotoxic crisis or storm: Secondary | ICD-10-CM | POA: Diagnosis not present

## 2020-04-12 DIAGNOSIS — F4310 Post-traumatic stress disorder, unspecified: Secondary | ICD-10-CM | POA: Diagnosis not present

## 2020-04-17 ENCOUNTER — Inpatient Hospital Stay: Admission: RE | Admit: 2020-04-17 | Payer: BC Managed Care – PPO | Source: Ambulatory Visit

## 2020-04-17 DIAGNOSIS — H9201 Otalgia, right ear: Secondary | ICD-10-CM | POA: Diagnosis not present

## 2020-04-21 ENCOUNTER — Other Ambulatory Visit: Payer: Self-pay

## 2020-04-21 ENCOUNTER — Ambulatory Visit: Payer: BC Managed Care – PPO | Admitting: Family Medicine

## 2020-04-21 VITALS — BP 126/72 | HR 77 | Temp 98.1°F | Wt 287.6 lb

## 2020-04-21 DIAGNOSIS — H66001 Acute suppurative otitis media without spontaneous rupture of ear drum, right ear: Secondary | ICD-10-CM

## 2020-04-21 NOTE — Progress Notes (Signed)
Established Patient Office Visit  Subjective:  Patient ID: Jessica Silva, female    DOB: [birth year]  Age: 34 y.o. MRN: [id number]  CC:  Chief Complaint  Patient presents with  . Ear Pain    right ear pain since sunday     HPI Jessica Silva presents for right earache.  The start of the weekend.  [place] and she states when they were driving over 7829 feet altitude her ear "popped ".  She has had ear problems in the past.  She has had some recent nasal congestion and sinusitis type symptoms.  She actually did virtual visit on Monday it was started on Augmentin.  Her sinus symptoms are slightly better but her right ear pain persist.  No drainage.  No hearing loss.  Occasional fleeting vertigo.  No fevers or chills.  Past Medical History:  Diagnosis Date  . Abnormal uterine bleeding   . Amenorrhea   . Anxiety   . Depression   . GERD (gastroesophageal reflux disease)   . Hashimoto's thyroiditis   . Hashimoto's thyroiditis   . Hormone disorder   . Infertility, female   . PONV (postoperative nausea and vomiting)   . Premature ovarian failure 2002  . Premature ovarian failure 2002  . Psychogenic air hunger     Past Surgical History:  Procedure Laterality Date  . CESAREAN SECTION N/A 04/19/2018   Procedure: CESAREAN SECTION;  Surgeon: Sherian Rein, MD;  Location: [facility];  Service: Obstetrics;  Laterality: N/A;  . DILATATION & CURETTAGE/HYSTEROSCOPY WITH MYOSURE N/A 05/07/2016   Procedure: DILATATION & CURETTAGE/HYSTEROSCOPY WITH MYOSURE;  Surgeon: Romualdo Bolk, MD;  Location: [facility];  Service: Gynecology;  Laterality: N/A;  . DILATION AND CURETTAGE OF UTERUS    . radioactive iodine treatment     thyroid  . TONSILLECTOMY    . TONSILLECTOMY AND ADENOIDECTOMY    . WISDOM TOOTH EXTRACTION      Family History  Problem Relation Age of Onset  . Hiatal hernia Mother   . Atrial fibrillation Father   . Hypertension Sister   . Polycystic  ovary syndrome Sister   . Irritable bowel syndrome Brother   . Hiatal hernia Maternal Grandmother   . Irritable bowel syndrome Brother     Social History   Socioeconomic History  . Marital status: Married    Spouse name: Not on file  . Number of children: Not on file  . Years of education: Not on file  . Highest education level: Not on file  Occupational History  . Occupation: lake Psychologist, forensic: [employer]  Tobacco Use  . Smoking status: Former Smoker    Packs/day: 0.10    Years: 1.00    Pack years: 0.10    Types: Cigarettes  . Smokeless tobacco: Never Used  Vaping Use  . Vaping Use: Never used  Substance and Sexual Activity  . Alcohol use: Yes    Alcohol/week: 0.0 - 1.0 standard drinks    Comment: occasional  . Drug use: No  . Sexual activity: Yes    Partners: Male    Birth control/protection: None    Comment: has premature ovarian failure  Other Topics Concern  . Not on file  Social History Narrative  . Not on file   Social Determinants of Health   Financial Resource Strain:   . Difficulty of Paying Living Expenses:   Food Insecurity:   . Worried About Programme researcher, broadcasting/film/video in  the Last Year:   . Ran Out of Food in the Last Year:   Transportation Needs:   . Freight forwarder (Medical):   Marland Kitchen Lack of Transportation (Non-Medical):   Physical Activity:   . Days of Exercise per Week:   . Minutes of Exercise per Session:   Stress:   . Feeling of Stress :   Social Connections:   . Frequency of Communication with Friends and Family:   . Frequency of Social Gatherings with Friends and Family:   . Attends Religious Services:   . Active Member of Clubs or Organizations:   . Attends Banker Meetings:   Marland Kitchen Marital Status:   Intimate Partner Violence:   . Fear of Current or Ex-Partner:   . Emotionally Abused:   Marland Kitchen Physically Abused:   . Sexually Abused:     Outpatient Medications Prior to Visit  Medication Sig Dispense Refill  .  acetaminophen (TYLENOL) 325 MG tablet Take 325 mg by mouth every 6 (six) hours as needed for headache.    Marland Kitchen azithromycin (ZITHROMAX) 250 MG tablet Take 2 tabs today, then 1 tab daily for 4 days 1 each 0  . benzonatate (TESSALON) 100 MG capsule Take 1 capsule (100 mg total) by mouth every 8 (eight) hours. 21 capsule 0  . buPROPion (WELLBUTRIN XL) 300 MG 24 hr tablet TAKE 1 TABLET BY MOUTH EVERY DAY 90 tablet 0  . fluticasone (FLONASE) 50 MCG/ACT nasal spray Place 2 sprays into both nostrils daily. 16 g 0  . guaiFENesin-codeine (CHERATUSSIN AC) 100-10 MG/5ML syrup Take 5 mLs by mouth 4 (four) times daily as needed for cough. 120 mL 0  . ibuprofen (ADVIL,MOTRIN) 800 MG tablet Take 1 tablet (800 mg total) by mouth every 8 (eight) hours as needed. 45 tablet 1  . methimazole (TAPAZOLE) 10 MG tablet Take 1 tablet (10 mg total) by mouth daily. 30 tablet 0  . metoprolol succinate (TOPROL-XL) 50 MG 24 hr tablet Take 2 tablets (100 mg total) by mouth in the morning and at bedtime. Take with or immediately following a meal. 180 tablet 1   No facility-administered medications prior to visit.    Allergies  Allergen Reactions  . Bee Venom Hives  . Penicillins Hives    Has patient had a PCN reaction causing immediate rash, facial/tongue/throat swelling, SOB or lightheadedness with hypotension: unknown Has patient had a PCN reaction causing severe rash involving mucus membranes or skin necrosis: unknown Has patient had a PCN reaction that required hospitalization no Has patient had a PCN reaction occurring within the last 10 years: no If all of the above answers are "NO", then may proceed with Cephalosporin use.     ROS Review of Systems  Constitutional: Negative for chills and fever.  HENT: Positive for ear pain. Negative for ear discharge and hearing loss.   Respiratory: Negative for cough.       Objective:    Physical Exam Vitals reviewed.  Constitutional:      Appearance: Normal appearance.    HENT:     Ears:     Comments: Right eardrum reveals some erythema especially along the posterior aspect.  Her eardrum is retracted and normal landmarks not visible.  No evidence for perforation.  Left eardrum reveals mild retraction otherwise normal Cardiovascular:     Rate and Rhythm: Normal rate and regular rhythm.  Pulmonary:     Effort: Pulmonary effort is normal.     Breath sounds: Normal breath sounds.  Musculoskeletal:  Cervical back: Neck supple.  Lymphadenopathy:     Cervical: No cervical adenopathy.  Neurological:     Mental Status: She is alert.     BP 126/72 (BP Location: Left Arm, Patient Position: Sitting, Cuff Size: Normal)   Pulse 77   Temp 98.1 F (36.7 C) (Oral)   Wt 287 lb 9.6 oz (130.5 kg)   SpO2 99%   BMI 54.34 kg/m  Wt Readings from Last 3 Encounters:  04/21/20 287 lb 9.6 oz (130.5 kg)  10/22/19 265 lb (120.2 kg)  05/26/19 264 lb 14.4 oz (120.2 kg)     Health Maintenance Due  Topic Date Due  . Hepatitis C Screening  Never done  . COVID-19 Vaccine (1) Never done  . PAP SMEAR-Modifier  04/12/2019  . INFLUENZA VACCINE  04/09/2020    There are no preventive care reminders to display for this patient.  Lab Results  Component Value Date   TSH <0.010 (L) 10/22/2019   Lab Results  Component Value Date   WBC 4.8 10/22/2019   HGB 14.3 10/22/2019   HCT 42.6 10/22/2019   MCV 87.7 10/22/2019   PLT 225 10/22/2019   Lab Results  Component Value Date   NA 141 10/22/2019   K 3.9 10/22/2019   CO2 25 10/22/2019   GLUCOSE 107 (H) 10/22/2019   BUN 16 10/22/2019   CREATININE 0.72 10/22/2019   BILITOT 0.5 05/26/2019   ALKPHOS 122 (H) 05/26/2019   AST 16 05/26/2019   ALT 27 05/26/2019   PROT 6.4 05/26/2019   ALBUMIN 4.0 05/26/2019   CALCIUM 9.4 10/22/2019   ANIONGAP 10 10/22/2019   GFR 109.18 05/26/2019   Lab Results  Component Value Date   CHOL 164 05/26/2019   Lab Results  Component Value Date   HDL 46.80 05/26/2019   Lab Results   Component Value Date   LDLCALC 98 04/11/2016   Lab Results  Component Value Date   TRIG 203.0 (H) 05/26/2019   Lab Results  Component Value Date   CHOLHDL 3 05/26/2019   No results found for: HGBA1C    Assessment & Plan:   Acute right otitis media  -Continue Augmentin.  She was prescribed full 10-day course. -Touch base if your symptoms not fully resolving over the next week or 2  No orders of the defined types were placed in this encounter.   Follow-up: No follow-ups on file.    [name], MD

## 2020-04-21 NOTE — Patient Instructions (Signed)

## 2020-04-25 DIAGNOSIS — Z1389 Encounter for screening for other disorder: Secondary | ICD-10-CM | POA: Diagnosis not present

## 2020-04-26 DIAGNOSIS — F4310 Post-traumatic stress disorder, unspecified: Secondary | ICD-10-CM | POA: Diagnosis not present

## 2020-05-01 ENCOUNTER — Other Ambulatory Visit: Payer: Self-pay | Admitting: Family Medicine

## 2020-05-10 DIAGNOSIS — F4310 Post-traumatic stress disorder, unspecified: Secondary | ICD-10-CM | POA: Diagnosis not present

## 2020-05-24 DIAGNOSIS — F4310 Post-traumatic stress disorder, unspecified: Secondary | ICD-10-CM | POA: Diagnosis not present

## 2020-05-29 DIAGNOSIS — Z130 Encounter for screening for diseases of the blood and blood-forming organs and certain disorders involving the immune mechanism: Secondary | ICD-10-CM | POA: Diagnosis not present

## 2020-05-29 DIAGNOSIS — E0500 Thyrotoxicosis with diffuse goiter without thyrotoxic crisis or storm: Secondary | ICD-10-CM | POA: Diagnosis not present

## 2020-05-29 DIAGNOSIS — Z124 Encounter for screening for malignant neoplasm of cervix: Secondary | ICD-10-CM | POA: Diagnosis not present

## 2020-05-29 DIAGNOSIS — Z1389 Encounter for screening for other disorder: Secondary | ICD-10-CM | POA: Diagnosis not present

## 2020-05-30 DIAGNOSIS — E890 Postprocedural hypothyroidism: Secondary | ICD-10-CM | POA: Diagnosis not present

## 2020-05-30 DIAGNOSIS — Z124 Encounter for screening for malignant neoplasm of cervix: Secondary | ICD-10-CM | POA: Diagnosis not present

## 2020-06-05 ENCOUNTER — Other Ambulatory Visit: Payer: Self-pay

## 2020-06-05 ENCOUNTER — Ambulatory Visit: Payer: BC Managed Care – PPO | Admitting: Family Medicine

## 2020-06-07 DIAGNOSIS — F4310 Post-traumatic stress disorder, unspecified: Secondary | ICD-10-CM | POA: Diagnosis not present

## 2020-06-12 ENCOUNTER — Ambulatory Visit: Payer: BC Managed Care – PPO | Admitting: Family Medicine

## 2020-06-13 IMAGING — US US MFM OB FOLLOW-UP
1 series · 14 of 28 positions shown · non-contrast
Comparison: none

[Series 1: us mfm ob follow-up · 63 acquisitions, 14 frames shown]
[im 3/63]
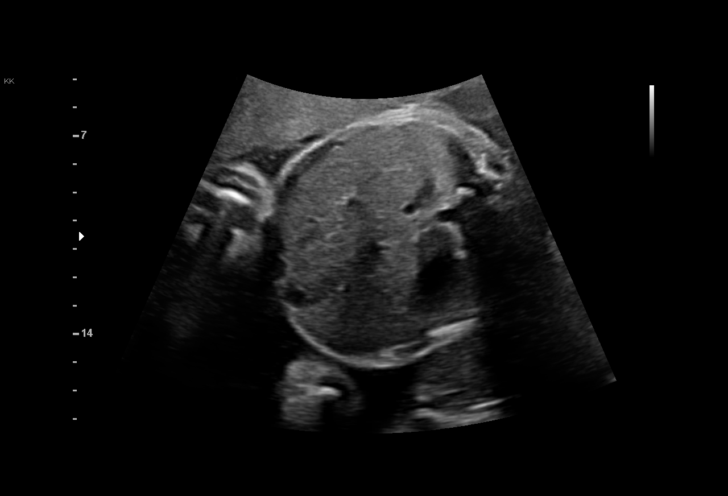
[im 7/63]
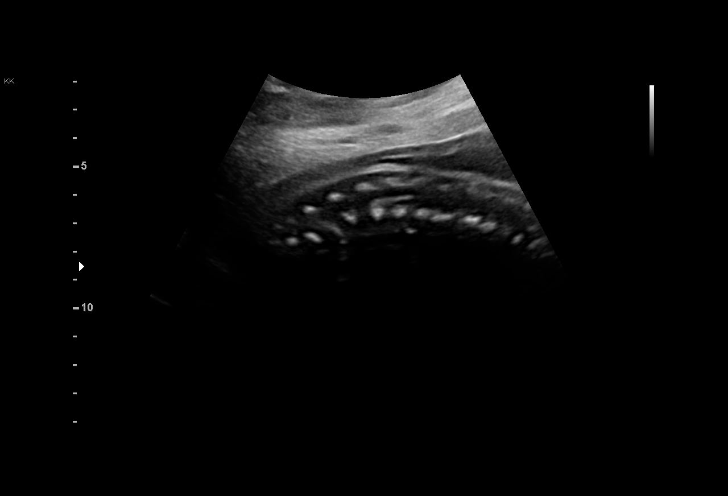
[im 12/63]
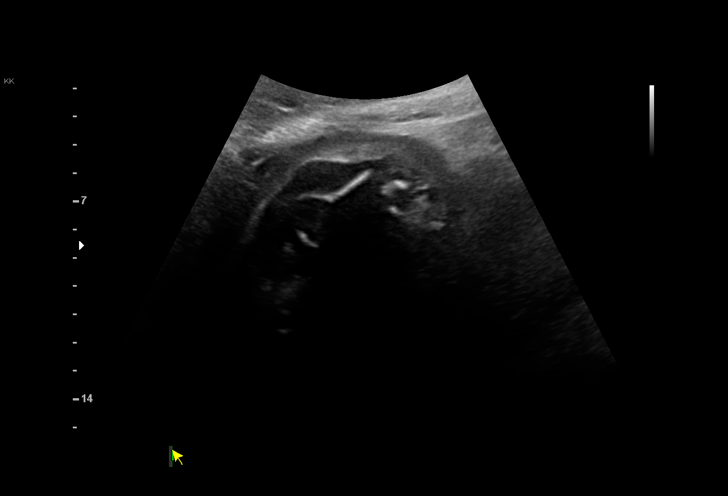
[im 17/63]
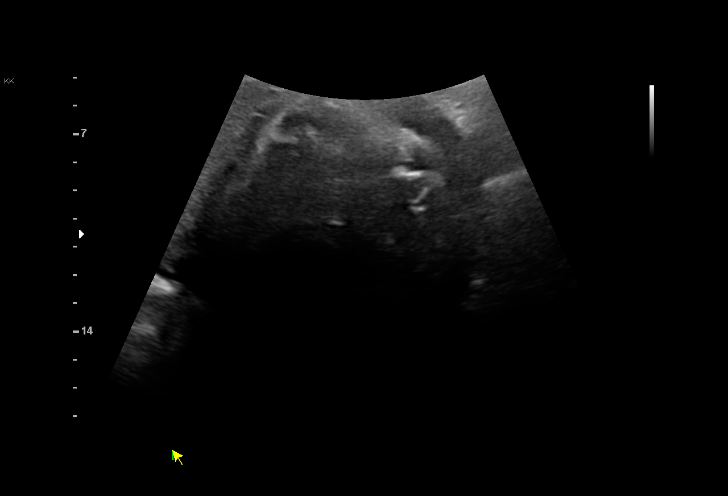
[im 21/63]
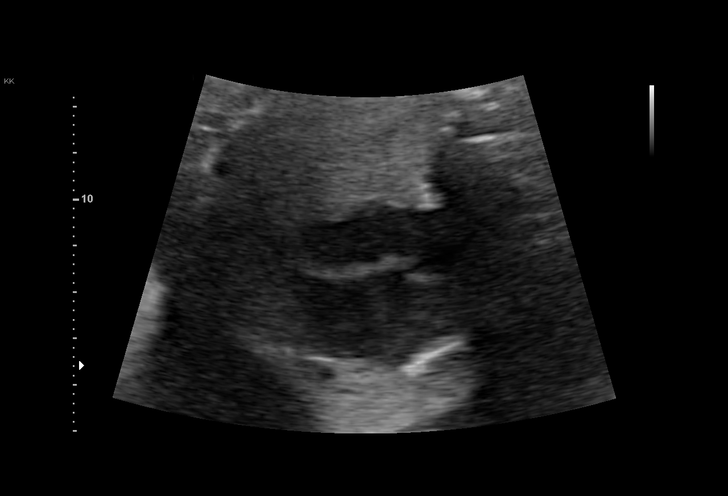
[im 26/63]
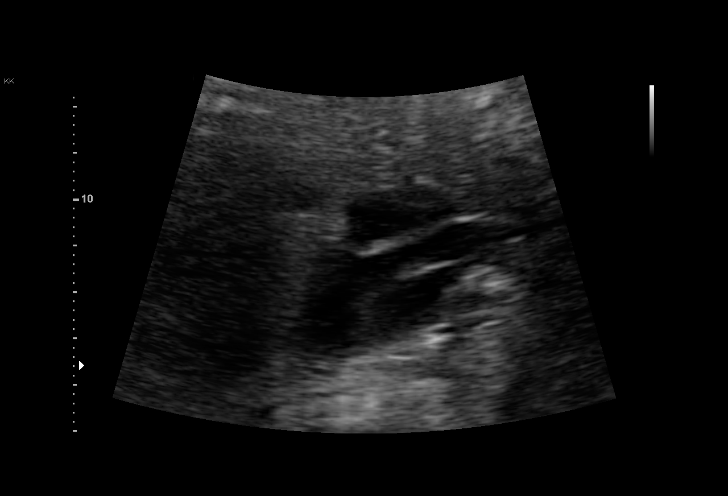
[im 30/63]
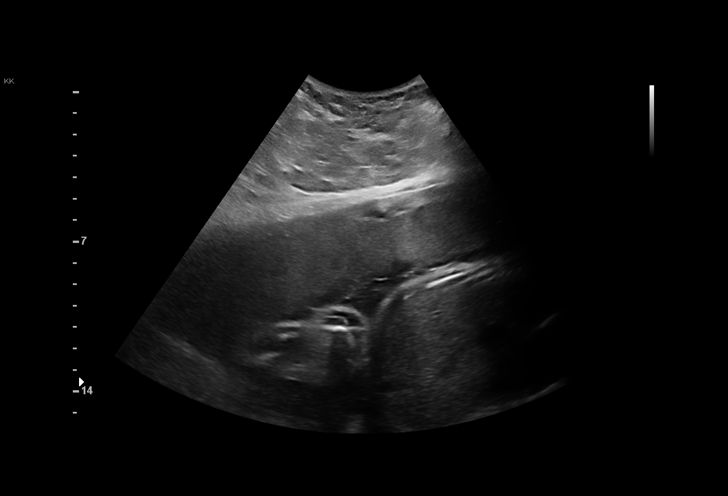
[im 35/63]
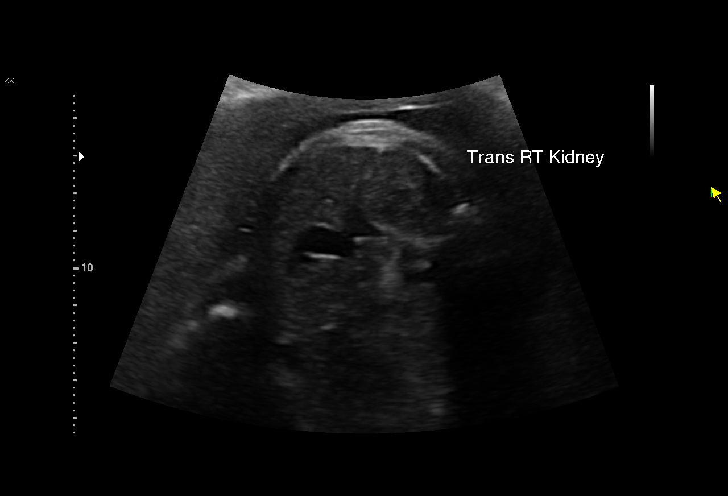
[im 40/63]
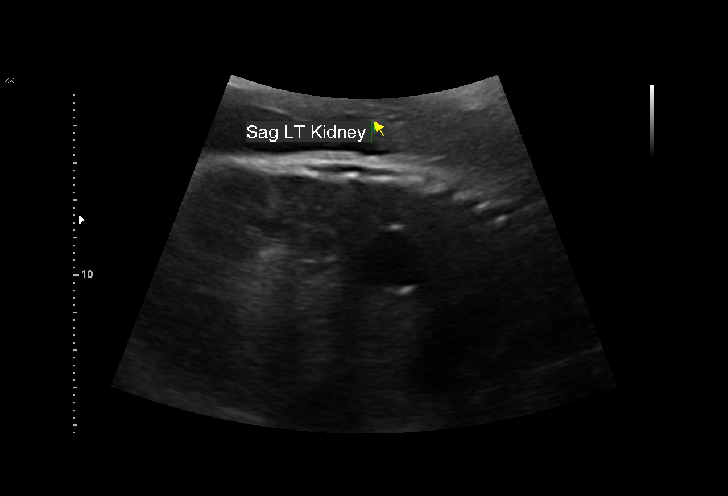
[im 44/63]
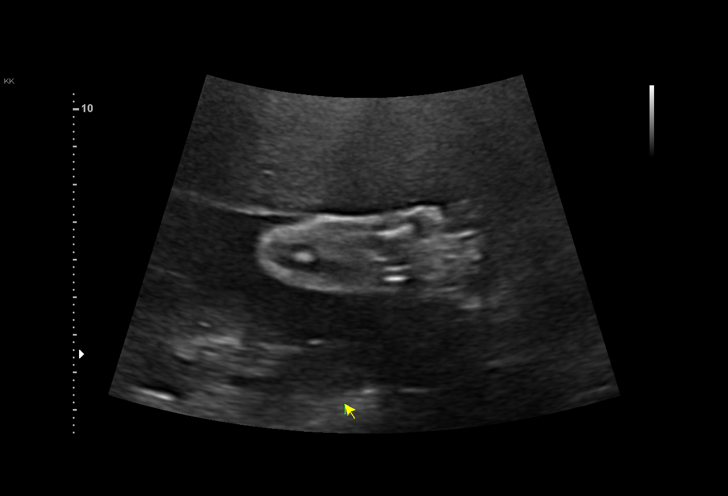
[im 49/63]
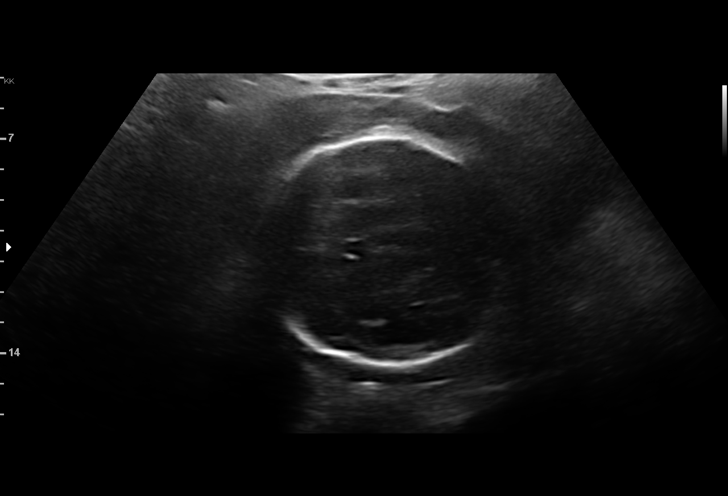
[im 53/63]
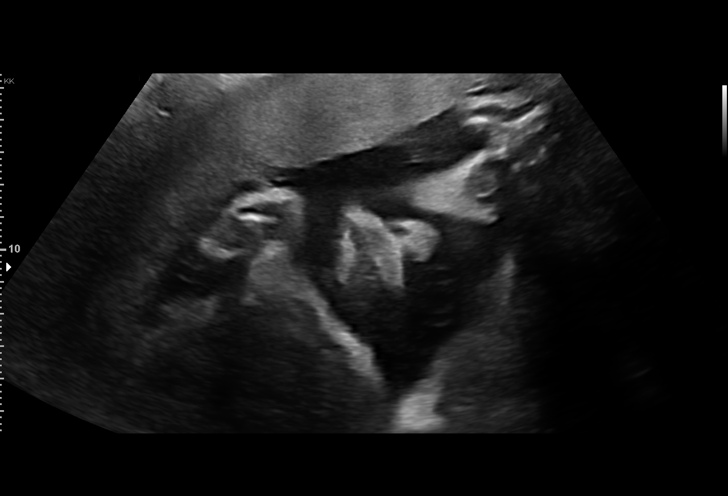
[im 58/63]
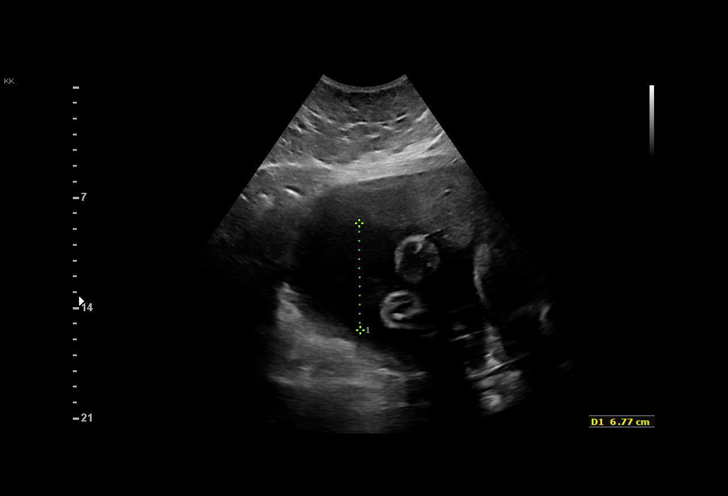
[im 63/63]
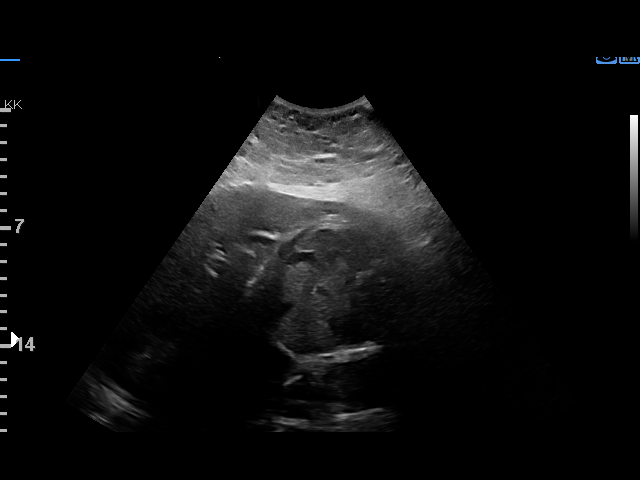

[14 of 28 positions shown; findings below may reference images not displayed]

Indications

30 weeks gestation of pregnancy
Obesity complicating pregnancy, second
trimester
Family history of genetic disorder
(Immunoglobulin A1 deficiency FOB)
Pregnancy resulting from assisted
reproductive technology (IVF using donor
eggs)
Thyroid disease in pregnancy (Mupika      [id number],
thyroiditis)
Antenatal follow-up for nonvisualized fetal
anatomy
OB History

Blood Type:            Height:  5'1"   Weight (lb):  273       BMI:
Gravidity:    2         Term:   0        Prem:   0         SAB:   1
TOP:          0       Ectopic:  0        Living: 0
Fetal Evaluation

Num Of Fetuses:     1
Fetal Heart         167
Rate(bpm):
Cardiac Activity:   Observed
Presentation:       Cephalic
Placenta:           Anterior, above cervical os
P. Cord Insertion:  Previously Visualized

Amniotic Fluid
AFI FV:      Subjectively within normal limits

AFI Sum(cm)     %Tile       Largest Pocket(cm)
13.26           40
RUQ(cm)       RLQ(cm)                      LLQ(cm)
6.77
Biometry

BPD:      74.6  mm     G. Age:  30w 0d         27  %    CI:         77.83  %    70 - 86
FL/HC:       21.7  %    19.2 -
HC:      267.6  mm     G. Age:  29w 1d        < 3  %    HC/AC:       0.91       0.99 -
AC:      294.9  mm     G. Age:  33w 3d       > 97  %    FL/BPD:      78.0  %    71 - 87
FL:       58.2  mm     G. Age:  30w 3d         40  %    FL/AC:       19.7  %    20 - 24

Est. FW:    0951   gm     4 lb 1 oz     78  %
Gestational Age

U/S Today:     30w 5d                                        EDD:    05/03/18
Best:          30w 2d     Det. By:  Embryo Transfer          EDD:    05/06/18
Anatomy

Cranium:               Appears normal         Aortic Arch:            Not well visualized
Cavum:                 Previously seen        Ductal Arch:            Not well visualized
Ventricles:            Previously seen        Diaphragm:              Previously seen
Choroid Plexus:        Previously seen        Stomach:                Appears normal, left
sided
Cerebellum:            Previously seen        Abdomen:                Previously seen
Posterior Fossa:       Previously seen        Abdominal Wall:         Previously seen
Nuchal Fold:           Not applicable (>20    Cord Vessels:           Previously seen
wks GA)
Face:                  Orbits and profile     Kidneys:                Appear normal
previously seen
Lips:                  Appears normal         Bladder:                Appears normal
Thoracic:              Appears normal         Spine:                  Appears normal
Heart:                 Appears normal         Upper Extremities:      Previously seen
(4CH, axis, and situs
RVOT:                  Not well visualized    Lower Extremities:      Previously seen
LVOT:                  Not well visualized

Other:  Fetus appears to be a female prev seen. Heels prev visualized.
Technically difficult due to maternal habitus and fetal position.
Cervix Uterus Adnexa

Cervix
Not visualized (advanced GA >87wks)
Impression

SIUP at 30+2 weeks
Normal interval anatomy; anatomic survey complete except
for outflow tracts and arches
Normal amniotic fluid volume
Appropriate interval growth with EFW at the 78th %tile
Recommendations

Keep previously scheduled ultrasound for growth in 4 weeks

## 2020-06-21 DIAGNOSIS — F4310 Post-traumatic stress disorder, unspecified: Secondary | ICD-10-CM | POA: Diagnosis not present

## 2020-06-23 DIAGNOSIS — Z131 Encounter for screening for diabetes mellitus: Secondary | ICD-10-CM | POA: Diagnosis not present

## 2020-06-23 DIAGNOSIS — Z1329 Encounter for screening for other suspected endocrine disorder: Secondary | ICD-10-CM | POA: Diagnosis not present

## 2020-06-23 DIAGNOSIS — Z13220 Encounter for screening for lipoid disorders: Secondary | ICD-10-CM | POA: Diagnosis not present

## 2020-06-23 DIAGNOSIS — Z13228 Encounter for screening for other metabolic disorders: Secondary | ICD-10-CM | POA: Diagnosis not present

## 2020-06-24 DIAGNOSIS — R000 Tachycardia, unspecified: Secondary | ICD-10-CM | POA: Insufficient documentation

## 2020-06-24 NOTE — Progress Notes (Signed)
Patient referred by Eulas Post, MD for palpitations  Subjective:   Jessica Silva, female    DOB: 09-05-1986, 34 y.o.   MRN: [id number]   Chief Complaint  Patient presents with  . Palpitations  . New Patient (Initial Visit)     HPI  34 y.o. Caucasian female with h/o premature ovarian failure at age 52, h/o hashimoto thyroiditis, and Grave's disease s/p radioiodine ablation,  Obesity, referred for evaluation of palpitations.  Patient is a therapist, has her own practice. Her physical activity has been limited due to work stress. She is trying weight loss programs. She has had intermittent episodes of palpitations, lasting for a few min, not associated with chest pain, shortness of breath, presyncope or syncope.  During her thyroid toxicity.  With Graves' disease, she had resting tachycardia, but did not have any other tachyarrhythmias.  Patient recently underwent CRP testing through her weight loss program, it was mildly elevated at 8.  She had recently had her Covid booster few days prior, and also has a tooth that she has undergone root canal for without crown placement.    Past Medical History:  Diagnosis Date  . Abnormal uterine bleeding   . Amenorrhea   . Anxiety   . Depression   . GERD (gastroesophageal reflux disease)   . Hashimoto's thyroiditis   . Hashimoto's thyroiditis   . Hormone disorder   . Infertility, female   . PONV (postoperative nausea and vomiting)   . Premature ovarian failure 2002  . Premature ovarian failure 2002  . Psychogenic air hunger      Past Surgical History:  Procedure Laterality Date  . CESAREAN SECTION N/A 04/19/2018   Procedure: CESAREAN SECTION;  Surgeon: Janyth Contes, MD;  Location: [facility];  Service: Obstetrics;  Laterality: N/A;  . DILATATION & CURETTAGE/HYSTEROSCOPY WITH MYOSURE N/A 05/07/2016   Procedure: DILATATION & CURETTAGE/HYSTEROSCOPY WITH MYOSURE;  Surgeon: Salvadore Dom, MD;  Location: [facility];  Service: Gynecology;  Laterality: N/A;  . DILATION AND CURETTAGE OF UTERUS    . radioactive iodine treatment     thyroid  . TONSILLECTOMY    . TONSILLECTOMY AND ADENOIDECTOMY    . WISDOM TOOTH EXTRACTION       Social History   Tobacco Use  Smoking Status Former Smoker  . Packs/day: 0.10  . Years: 1.00  . Pack years: 0.10  . Types: Cigarettes  Smokeless Tobacco Never Used    Social History   Substance and Sexual Activity  Alcohol Use Yes  . Alcohol/week: 0.0 - 1.0 standard drinks   Comment: occasional     Family History  Problem Relation Age of Onset  . Hiatal hernia Mother   . Atrial fibrillation Father   . Hypertension Sister   . Polycystic ovary syndrome Sister   . Irritable bowel syndrome Brother   . Hiatal hernia Maternal Grandmother   . Irritable bowel syndrome Brother      Current Outpatient Medications on File Prior to Visit  Medication Sig Dispense Refill  . acetaminophen (TYLENOL) 325 MG tablet Take 325 mg by mouth every 6 (six) hours as needed for headache.    . benzonatate (TESSALON) 100 MG capsule Take 1 capsule (100 mg total) by mouth every 8 (eight) hours. 21 capsule 0  . buPROPion (WELLBUTRIN XL) 300 MG 24 hr tablet TAKE 1 TABLET BY MOUTH EVERY DAY 90 tablet 0  . fluticasone (FLONASE) 50 MCG/ACT nasal spray Place 2 sprays into both nostrils daily. [place]  g 0  . ibuprofen (ADVIL,MOTRIN) 800 MG tablet Take 1 tablet (800 mg total) by mouth every 8 (eight) hours as needed. 45 tablet 1  . metoprolol succinate (TOPROL-XL) 50 MG 24 hr tablet Take 2 tablets (100 mg total) by mouth in the morning and at bedtime. Take with or immediately following a meal. (Patient taking differently: Take 50 mg by mouth daily. Take with or immediately following a meal.) 180 tablet 1  . Multiple Vitamins-Minerals (MULTIVITAMIN ADULTS PO) Take 1 tablet by mouth daily.    . Probiotic Product (PROBIOTIC-10 PO) Take 1 tablet by mouth daily.    Marland Kitchen levothyroxine (SYNTHROID) 150 MCG  tablet Take 150 mcg by mouth every morning.     No current facility-administered medications on file prior to visit.    Cardiovascular and other pertinent studies:  EKG 06/30/2020: Sinus rhythm 77 bpm  Normal EKG  EKG 10/22/2019: Sinus tachycardia 118 bpm   Recent labs: 9.21.2021: TSH 3.2  02/25/2020: TSH 56 high Free TR 1.2  10/22/2019: Glucose 107, BUN/Cr 16/0.72. EGFR >60. Na/K 141/3.9. Rest of the CMP normal H/H 14/42. MCV 87. Platelets 225 TSH <0.001 low Trop HS <2  05/2019: Chol 164, TG 203, HDL 46, LDL 97    Review of Systems  Cardiovascular: Positive for palpitations. Negative for chest pain, dyspnea on exertion, leg swelling and syncope.         Vitals:   06/30/20 0838  BP: 131/80  Pulse: 87  Resp: 16  SpO2: 94%     Body mass index is 54.23 kg/m. Filed Weights   06/30/20 0838  Weight: 287 lb (130.2 kg)     Objective:   Physical Exam Vitals and nursing note reviewed.  Constitutional:      General: She is not in acute distress.    Appearance: She is obese.  Neck:     Vascular: No JVD.  Cardiovascular:     Rate and Rhythm: Normal rate and regular rhythm.     Heart sounds: Normal heart sounds. No murmur heard.   Pulmonary:     Effort: Pulmonary effort is normal.     Breath sounds: Normal breath sounds. No wheezing or rales.           Assessment & Recommendations:   34 y.o. Caucasian female with h/o premature ovarian failure at age 66, h/o hashimoto thyroiditis, and Grave's disease s/p radioiodine ablation,  Obesity, referred for evaluation of palpitations.  Palpitations: Suspect benign PAC/PVC's. Recommend echocardiogram to evaluate for any structural abnormality, and monitor to evaluate for Afib.  We discussed vagal maneuvers.  Continue metoprolol succinate 50 mg, the patient is currently on.  Primary prevention: Patient has had primary ovarian failure since age 15.  This puts her at increased risk of coronary artery disease  compared to other women.  Her CRP is mildly elevated, although this could be related to other acute inflammatory issues.  We have discussed regarding calcium score testing for screening for CAD.  Patient currently does not have any plans for pregnancy.    Thank you for referring the patient to [place]. Please feel free to contact with any questions.   Nigel Mormon, MD Pager: [phone] Office: [phone]

## 2020-06-30 ENCOUNTER — Ambulatory Visit: Payer: BC Managed Care – PPO | Admitting: Cardiology

## 2020-06-30 ENCOUNTER — Other Ambulatory Visit: Payer: Self-pay

## 2020-06-30 ENCOUNTER — Ambulatory Visit: Payer: BC Managed Care – PPO

## 2020-06-30 ENCOUNTER — Encounter: Payer: Self-pay | Admitting: Cardiology

## 2020-06-30 VITALS — BP 131/80 | HR 87 | Resp 16 | Ht 61.0 in | Wt 287.0 lb

## 2020-06-30 DIAGNOSIS — R000 Tachycardia, unspecified: Secondary | ICD-10-CM

## 2020-06-30 DIAGNOSIS — R002 Palpitations: Secondary | ICD-10-CM

## 2020-06-30 DIAGNOSIS — Z136 Encounter for screening for cardiovascular disorders: Secondary | ICD-10-CM

## 2020-06-30 NOTE — Addendum Note (Signed)
Addended by: Elder Negus on: 06/30/2020 03:37 PM   Modules accepted: Orders

## 2020-07-07 ENCOUNTER — Other Ambulatory Visit: Payer: BC Managed Care – PPO

## 2020-07-18 DIAGNOSIS — R002 Palpitations: Secondary | ICD-10-CM | POA: Diagnosis not present

## 2020-07-19 DIAGNOSIS — F4310 Post-traumatic stress disorder, unspecified: Secondary | ICD-10-CM | POA: Diagnosis not present

## 2020-07-21 DIAGNOSIS — R002 Palpitations: Secondary | ICD-10-CM | POA: Diagnosis not present

## 2020-07-24 ENCOUNTER — Other Ambulatory Visit: Payer: Self-pay | Admitting: Family Medicine

## 2020-07-25 DIAGNOSIS — R7982 Elevated C-reactive protein (CRP): Secondary | ICD-10-CM

## 2020-07-25 DIAGNOSIS — R931 Abnormal findings on diagnostic imaging of heart and coronary circulation: Secondary | ICD-10-CM

## 2020-07-25 MED ORDER — ROSUVASTATIN CALCIUM 10 MG PO TABS: 10.0000 mg | ORAL_TABLET | Freq: Every day | ORAL | 3 refills | Status: DC

## 2020-07-25 NOTE — Telephone Encounter (Signed)
Discussed the test results with patient.  Based on monitor results, she has symptomatic PVCs that seems to have improved with metoprolol.  Calcium score is elevated at 6, which puts her at 90th percentile for her age.  Given her risk factors of ovarian failure, and elevated calcium score, recommend rosuvastatin 10 mg daily.  Repeat lipid panel and CRP in 3 months.

## 2020-07-28 DIAGNOSIS — E890 Postprocedural hypothyroidism: Secondary | ICD-10-CM | POA: Diagnosis not present

## 2020-08-10 ENCOUNTER — Telehealth: Payer: BC Managed Care – PPO | Admitting: Family Medicine

## 2020-08-10 ENCOUNTER — Encounter: Payer: Self-pay | Admitting: Family Medicine

## 2020-08-10 VITALS — Temp 98.1°F

## 2020-08-10 DIAGNOSIS — R509 Fever, unspecified: Secondary | ICD-10-CM | POA: Diagnosis not present

## 2020-08-10 DIAGNOSIS — R1013 Epigastric pain: Secondary | ICD-10-CM | POA: Diagnosis not present

## 2020-08-10 DIAGNOSIS — K219 Gastro-esophageal reflux disease without esophagitis: Secondary | ICD-10-CM | POA: Diagnosis not present

## 2020-08-10 DIAGNOSIS — R110 Nausea: Secondary | ICD-10-CM

## 2020-08-10 DIAGNOSIS — R1011 Right upper quadrant pain: Secondary | ICD-10-CM | POA: Diagnosis not present

## 2020-08-10 NOTE — Progress Notes (Signed)
Virtual Visit via Video Note  I [name]  on 08/10/20 at 11:00 AM EST by a video enabled telemedicine application and verified that I am speaking with the correct person using two identifiers.  Location patient: home,  Location provider:work or home office Persons participating in the virtual visit: patient, provider  I discussed the limitations of evaluation and management by telemedicine and the availability of in person appointments. The patient expressed understanding and agreed to proceed.   HPI:  Acute telemedicine visit for Stomach issues: -Onset: 3-4 days ago -has had a lot of stress and does get abd issues with anxiety however, this is more than usual -Symptoms include: focal discomfort in epigastric stomach region, nausea, reflux, fever of 101 last night - 100.3 when rechecked, chills -had covid test this morning and it was negative  -kids in daycare -Denies: resp symptoms, inability to eat and drink, vomiting, stool a little soft but not much diarrhea, sick contacts -flying out of country in 2 days -COVID-19 vaccine status:fully vaccinated for covid + booster  ROS: See pertinent positives and negatives per HPI.  Past Medical History:  Diagnosis Date  . Abnormal uterine bleeding   . Amenorrhea   . Anxiety   . Depression   . GERD (gastroesophageal reflux disease)   . Hashimoto's thyroiditis   . Hashimoto's thyroiditis   . Hormone disorder   . Infertility, female   . PONV (postoperative nausea and vomiting)   . Premature ovarian failure 2002  . Premature ovarian failure 2002  . Psychogenic air hunger     Past Surgical History:  Procedure Laterality Date  . CESAREAN SECTION N/A 04/19/2018   Procedure: CESAREAN SECTION;  Surgeon: Sherian Rein, MD;  Location: [facility];  Service: Obstetrics;  Laterality: N/A;  . DILATATION & CURETTAGE/HYSTEROSCOPY WITH MYOSURE N/A 05/07/2016   Procedure: DILATATION & CURETTAGE/HYSTEROSCOPY WITH MYOSURE;   Surgeon: Romualdo Bolk, MD;  Location: [facility];  Service: Gynecology;  Laterality: N/A;  . DILATION AND CURETTAGE OF UTERUS    . radioactive iodine treatment     thyroid  . TONSILLECTOMY    . TONSILLECTOMY AND ADENOIDECTOMY    . WISDOM TOOTH EXTRACTION       Current Outpatient Medications:  .  acetaminophen (TYLENOL) 325 MG tablet, Take 325 mg by mouth every 6 (six) hours as needed for headache., Disp: , Rfl:  .  buPROPion (WELLBUTRIN XL) 300 MG 24 hr tablet, TAKE 1 TABLET BY MOUTH EVERY DAY, Disp: 90 tablet, Rfl: 0 .  ibuprofen (ADVIL,MOTRIN) 800 MG tablet, Take 1 tablet (800 mg total) by mouth every 8 (eight) hours as needed., Disp: 45 tablet, Rfl: 1 .  levothyroxine (SYNTHROID) 175 MCG tablet, Take 175 mcg by mouth daily., Disp: , Rfl:  .  metoprolol succinate (TOPROL-XL) 50 MG 24 hr tablet, Take 2 tablets (100 mg total) by mouth in the morning and at bedtime. Take with or immediately following a meal. (Patient taking differently: Take 50 mg by mouth in the morning and at bedtime. Take with or immediately following a meal.), Disp: 180 tablet, Rfl: 1 .  Multiple Vitamins-Minerals (MULTIVITAMIN ADULTS PO), Take 1 tablet by mouth daily., Disp: , Rfl:  .  Probiotic Product (PROBIOTIC-10 PO), Take 1 tablet by mouth daily., Disp: , Rfl:  .  rosuvastatin (CRESTOR) 10 MG tablet, Take 1 tablet (10 mg total) by mouth daily., Disp: 90 tablet, Rfl: 3  EXAM:  VITALS per patient if applicable:  GENERAL: alert, oriented, appears well and in  no acute distress  HEENT: atraumatic, conjunttiva clear, no obvious abnormalities on inspection of external nose and ears  NECK: normal movements of the head and neck  LUNGS: on inspection no signs of respiratory distress, breathing rate appears normal, no obvious gross SOB, gasping or wheezing  CV: no obvious cyanosis  ABD: On video visit exam she points to the epigastric region and slightly to the right of this area as the area of focal discomfort  and tenderness to palpation on self exam  MS: moves all visible extremities without noticeable abnormality  PSYCH/NEURO: pleasant and cooperative, no obvious depression or anxiety, speech and thought processing grossly intact  ASSESSMENT AND PLAN:  Discussed the following assessment and plan:  Epigastric pain  Fever, unspecified fever cause  Gastroesophageal reflux disease, unspecified whether esophagitis present  Nausea  -we discussed possible serious and likely etiologies, options for evaluation and workup, limitations of telemedicine visit vs in person visit, treatment, treatment risks and precautions. Pt prefers to treat via telemedicine empirically rather than in person at this moment.  While this could simply be a mild gastroenteritis, I am concerned that she has focal abdominal pain with a fever and nausea, particularly given the fact she is flying out of the country in 2 days.  Advised for these reasons that she seek in person evaluation today at a higher level of care.  Discussed options.  She agrees and is debating which option to use, nearby urgent care versus [facility], but she agrees to go today for evaluation.  I discussed the assessment and treatment plan with the patient. The patient was provided an opportunity to ask questions and all were answered. The patient agreed with the plan and demonstrated an understanding of the instructions.     Terressa Koyanagi, DO

## 2020-08-11 ENCOUNTER — Other Ambulatory Visit (HOSPITAL_BASED_OUTPATIENT_CLINIC_OR_DEPARTMENT_OTHER): Payer: Self-pay | Admitting: Emergency Medicine

## 2020-08-11 ENCOUNTER — Encounter (HOSPITAL_BASED_OUTPATIENT_CLINIC_OR_DEPARTMENT_OTHER): Payer: Self-pay | Admitting: Emergency Medicine

## 2020-08-11 ENCOUNTER — Emergency Department (HOSPITAL_BASED_OUTPATIENT_CLINIC_OR_DEPARTMENT_OTHER)
Admission: EM | Admit: 2020-08-11 | Discharge: 2020-08-11 | Disposition: A | Discharge status: Home / Self Care | Payer: BC Managed Care – PPO | Attending: Emergency Medicine | Admitting: Emergency Medicine

## 2020-08-11 ENCOUNTER — Other Ambulatory Visit: Payer: Self-pay

## 2020-08-11 ENCOUNTER — Emergency Department (HOSPITAL_BASED_OUTPATIENT_CLINIC_OR_DEPARTMENT_OTHER): Payer: BC Managed Care – PPO

## 2020-08-11 ENCOUNTER — Inpatient Hospital Stay (HOSPITAL_BASED_OUTPATIENT_CLINIC_OR_DEPARTMENT_OTHER): Admit: 2020-08-11 | Payer: BC Managed Care – PPO

## 2020-08-11 DIAGNOSIS — R509 Fever, unspecified: Secondary | ICD-10-CM | POA: Diagnosis not present

## 2020-08-11 DIAGNOSIS — Z87891 Personal history of nicotine dependence: Secondary | ICD-10-CM | POA: Insufficient documentation

## 2020-08-11 DIAGNOSIS — K219 Gastro-esophageal reflux disease without esophagitis: Secondary | ICD-10-CM | POA: Insufficient documentation

## 2020-08-11 DIAGNOSIS — R1011 Right upper quadrant pain: Secondary | ICD-10-CM | POA: Insufficient documentation

## 2020-08-11 DIAGNOSIS — R1013 Epigastric pain: Secondary | ICD-10-CM | POA: Diagnosis not present

## 2020-08-11 DIAGNOSIS — R110 Nausea: Secondary | ICD-10-CM | POA: Insufficient documentation

## 2020-08-11 DIAGNOSIS — R109 Unspecified abdominal pain: Secondary | ICD-10-CM | POA: Diagnosis not present

## 2020-08-11 LAB — CBC WITH DIFFERENTIAL/PLATELET
Abs Immature Granulocytes: 0.06 10*3/uL (ref 0.00–0.07)
Basophils Absolute: 0 10*3/uL (ref 0.0–0.1)
Basophils Relative: 0 %
Eosinophils Absolute: 0.2 10*3/uL (ref 0.0–0.5)
Eosinophils Relative: 2 %
HCT: 40.9 % (ref 36.0–46.0)
Hemoglobin: 13.9 g/dL (ref 12.0–15.0)
Immature Granulocytes: 1 %
Lymphocytes Relative: 24 %
Lymphs Abs: 1.9 10*3/uL (ref 0.7–4.0)
MCH: 30.3 pg (ref 26.0–34.0)
MCHC: 34 g/dL (ref 30.0–36.0)
MCV: 89.1 fL (ref 80.0–100.0)
Monocytes Absolute: 0.5 10*3/uL (ref 0.1–1.0)
Monocytes Relative: 7 %
Neutro Abs: 5.2 10*3/uL (ref 1.7–7.7)
Neutrophils Relative %: 66 %
Platelets: 225 10*3/uL (ref 150–400)
RBC: 4.59 MIL/uL (ref 3.87–5.11)
RDW: 12 % (ref 11.5–15.5)
WBC: 7.8 10*3/uL (ref 4.0–10.5)
nRBC: 0 % (ref 0.0–0.2)

## 2020-08-11 LAB — COMPREHENSIVE METABOLIC PANEL
ALT: 37 U/L (ref 0–44)
AST: 22 U/L (ref 15–41)
Albumin: 3.7 g/dL (ref 3.5–5.0)
Alkaline Phosphatase: 53 U/L (ref 38–126)
Anion gap: 9 (ref 5–15)
BUN: 13 mg/dL (ref 6–20)
CO2: 27 mmol/L (ref 22–32)
Calcium: 8.6 mg/dL — ABNORMAL LOW (ref 8.9–10.3)
Chloride: 104 mmol/L (ref 98–111)
Creatinine, Ser: 0.8 mg/dL (ref 0.44–1.00)
GFR, Estimated: >60 mL/min (ref >60)
Glucose, Bld: 111 mg/dL — ABNORMAL HIGH (ref 70–99)
Potassium: 3.7 mmol/L (ref 3.5–5.1)
Sodium: 140 mmol/L (ref 135–145)
Total Bilirubin: 0.7 mg/dL (ref 0.3–1.2)
Total Protein: 6.6 g/dL (ref 6.5–8.1)

## 2020-08-11 LAB — URINALYSIS, ROUTINE W REFLEX MICROSCOPIC
Bilirubin Urine: NEGATIVE
Glucose, UA: NEGATIVE mg/dL
Ketones, ur: NEGATIVE mg/dL
Nitrite: NEGATIVE
Protein, ur: NEGATIVE mg/dL
Specific Gravity, Urine: 1.025 (ref 1.005–1.030)
pH: 6 (ref 5.0–8.0)

## 2020-08-11 LAB — LIPASE, BLOOD: Lipase: 23 U/L (ref 11–51)

## 2020-08-11 LAB — URINALYSIS, MICROSCOPIC (REFLEX)

## 2020-08-11 LAB — PREGNANCY, URINE: Preg Test, Ur: NEGATIVE

## 2020-08-11 MED ORDER — FENTANYL CITRATE (PF) 100 MCG/2ML IJ SOLN
100.0000 ug | Freq: Once | INTRAMUSCULAR | Status: AC
  Administered 2020-08-11: 100 ug via INTRAVENOUS
  Filled 2020-08-11: qty 2

## 2020-08-11 MED ORDER — ONDANSETRON 4 MG PO TBDP
4.0000 mg | ORAL_TABLET | Freq: Once | ORAL | Status: AC
  Administered 2020-08-11: 4 mg via ORAL
  Filled 2020-08-11: qty 1

## 2020-08-11 MED ORDER — TRAMADOL HCL 50 MG PO TABS
50.0000 mg | ORAL_TABLET | Freq: Four times a day (QID) | ORAL | 0 refills | Status: DC | PRN
Start: 2020-08-11 — End: 2020-08-11

## 2020-08-11 MED FILL — traMADol HCL 50 MG TABS: 50 | 4 days supply | Qty: 15 | Fill #0

## 2020-08-11 NOTE — Telephone Encounter (Signed)
Called the patient, left voicemail. I gather she may still be in the ED. If she calls back, my suspicion for statin causing her abdominal problems is low given normal labs. However, okay to hold statin until further workup is completed for her abdominal pain.   Thanks MJP

## 2020-08-11 NOTE — ED Provider Notes (Signed)
[facility] Provider Note: Lowella Dell, MD, FACEP  CSN: [id number] MRN: [id number] ARRIVAL: 08/11/20 at 0403 ROOM: [facility]10/[facility]10   CHIEF COMPLAINT  Abdominal Pain   HISTORY OF PRESENT ILLNESS  08/11/20 4:29 AM Jessica Silva is a 34 y.o. female who was started on rosuvastatin about 2 weeks ago.  She has been having epigastric and right upper quadrant pain for the past 6 days.  The pain has been waxing and waning and is getting worse.  It worsened again at this morning and she rates it as a 6 out of 10.  She describes it as tight and stabbing.  It is worse with movement or palpation.  Eating seems to make it worse as well.  She has had nausea but no vomiting or diarrhea.  She had a fever of 101 2 days ago but none since.  She has no urinary symptoms.  She was previously on omeprazole for GERD but was recently switched to pantoprazole.   Past Medical History:  Diagnosis Date  . Abnormal uterine bleeding   . Amenorrhea   . Anxiety   . Depression   . GERD (gastroesophageal reflux disease)   . Hashimoto's thyroiditis   . Hormone disorder   . Infertility, female   . PONV (postoperative nausea and vomiting)   . Premature ovarian failure 2002  . Psychogenic air hunger     Past Surgical History:  Procedure Laterality Date  . CESAREAN SECTION N/A 04/19/2018   Procedure: CESAREAN SECTION;  Surgeon: Sherian Rein, MD;  [facility]: [facility];  Service: Obstetrics;  Laterality: N/A;  . DILATATION & CURETTAGE/HYSTEROSCOPY WITH MYOSURE N/A 05/07/2016   Procedure: DILATATION & CURETTAGE/HYSTEROSCOPY WITH MYOSURE;  Surgeon: Romualdo Bolk, MD;  Location: [facility];  Service: Gynecology;  Laterality: N/A;  . DILATION AND CURETTAGE OF UTERUS    . radioactive iodine treatment     thyroid  . TONSILLECTOMY    . TONSILLECTOMY AND ADENOIDECTOMY    . WISDOM TOOTH EXTRACTION      Family History  Problem Relation Age of Onset  . Hiatal hernia Mother   . Atrial  fibrillation Father   . Hypertension Sister   . Polycystic ovary syndrome Sister   . Irritable bowel syndrome Brother   . Hiatal hernia Maternal Grandmother   . Irritable bowel syndrome Brother     Social History   Tobacco Use  . Smoking status: Former Smoker    Packs/day: 0.10    Years: 1.00    Pack years: 0.10    Types: Cigarettes    Quit date: 2015    Years since quitting: 6.9  . Smokeless tobacco: Never Used  Vaping Use  . Vaping Use: Never used  Substance Use Topics  . Alcohol use: Yes    Alcohol/week: 0.0 - 1.0 standard drinks    Comment: occasional  . Drug use: No    Prior to Admission medications   Medication Sig Start Date End Date Taking? Authorizing Provider  acetaminophen (TYLENOL) 325 MG tablet Take 325 mg by mouth every 6 (six) hours as needed for headache.    [provider]  buPROPion (WELLBUTRIN XL) 300 MG 24 hr tablet TAKE 1 TABLET BY MOUTH EVERY DAY 07/24/20   Burchette, Elberta Fortis, MD  ibuprofen (ADVIL,MOTRIN) 800 MG tablet Take 1 tablet (800 mg total) by mouth every 8 (eight) hours as needed. 04/22/18   Bovard-Stuckert, Augusto Gamble, MD  levothyroxine (SYNTHROID) 175 MCG tablet Take 175 mcg by mouth daily. 07/31/20  [provider]  metoprolol succinate (TOPROL-XL) 50 MG 24 hr tablet Take 2 tablets (100 mg total) by mouth in the morning and at bedtime. Take with or immediately following a meal. Patient taking differently: Take 50 mg by mouth in the morning and at bedtime. Take with or immediately following a meal. 10/22/19   Graciella Freer A, PA-C  Multiple Vitamins-Minerals (MULTIVITAMIN ADULTS PO) Take 1 tablet by mouth daily.    [provider]  Probiotic Product (PROBIOTIC-10 PO) Take 1 tablet by mouth daily.    [provider]  rosuvastatin (CRESTOR) 10 MG tablet Take 1 tablet (10 mg total) by mouth daily. 07/25/20 10/23/20  Patwardhan, Anabel Bene, MD    Allergies Bee venom and Penicillins   REVIEW OF SYSTEMS  Negative  except as noted here or in the History of Present Illness.   PHYSICAL EXAMINATION  Initial Vital Signs Blood pressure 124/64, pulse 94, temperature 98 F (36.7 C), temperature source Oral, resp. rate 16, height 5\' 1"  (1.549 m), weight 129.3 kg, SpO2 97 %.  Examination General: Well-developed, obese female in no acute distress; appearance consistent with age of record HENT: normocephalic; atraumatic Eyes: pupils equal, round and reactive to light; extraocular muscles intact Neck: supple Heart: regular rate and rhythm Lungs: clear to auscultation bilaterally Abdomen: soft; nondistended; right upper quadrant tenderness; bowel sounds present Extremities: No deformity; full range of motion; pulses normal Neurologic: Awake, alert and oriented; motor function intact in all extremities and symmetric; no facial droop Skin: Warm and dry Psychiatric: Normal mood and affect   RESULTS  Summary of this visit's results, reviewed and interpreted by myself:   EKG Interpretation  Date/Time:    Ventricular Rate:    PR Interval:    QRS Duration:   QT Interval:    QTC Calculation:   R Axis:     Text Interpretation:        Laboratory Studies: Results for orders placed or performed during the hospital encounter of 08/11/20 (from the past 24 hour(s))  Urinalysis, Routine w reflex microscopic Urine, Clean Catch     Status: Abnormal   Collection Time: 08/11/20  5:04 AM  Result Value Ref Range   Color, Urine YELLOW YELLOW   APPearance CLOUDY (A) CLEAR   Specific Gravity, Urine 1.025 1.005 - 1.030   pH 6.0 5.0 - 8.0   Glucose, UA NEGATIVE NEGATIVE mg/dL   Hgb urine dipstick MODERATE (A) NEGATIVE   Bilirubin Urine NEGATIVE NEGATIVE   Ketones, ur NEGATIVE NEGATIVE mg/dL   Protein, ur NEGATIVE NEGATIVE mg/dL   Nitrite NEGATIVE NEGATIVE   Leukocytes,Ua TRACE (A) NEGATIVE  Pregnancy, urine     Status: None   Collection Time: 08/11/20  5:04 AM  Result Value Ref Range   Preg Test, Ur NEGATIVE  NEGATIVE  CBC with Differential/Platelet     Status: None   Collection Time: 08/11/20  5:04 AM  Result Value Ref Range   WBC 7.8 4.0 - 10.5 K/uL   RBC 4.59 3.87 - 5.11 MIL/uL   Hemoglobin 13.9 12.0 - 15.0 g/dL   HCT 14/03/21 36 - 46 %   MCV 89.1 80.0 - 100.0 fL   MCH 30.3 26.0 - 34.0 pg   MCHC 34.0 30.0 - 36.0 g/dL   RDW 42.5 95.6 - 38.7 %   Platelets 225 150 - 400 K/uL   nRBC 0.0 0.0 - 0.2 %   Neutrophils Relative % 66 %   Neutro Abs 5.2 1.7 - 7.7 K/uL   Lymphocytes  Relative 24 %   Lymphs Abs 1.9 0.7 - 4.0 K/uL   Monocytes Relative 7 %   Monocytes Absolute 0.5 0.1 - 1.0 K/uL   Eosinophils Relative 2 %   Eosinophils Absolute 0.2 0.0 - 0.5 K/uL   Basophils Relative 0 %   Basophils Absolute 0.0 0.0 - 0.1 K/uL   Immature Granulocytes 1 %   Abs Immature Granulocytes 0.06 0.00 - 0.07 K/uL  Comprehensive metabolic panel     Status: Abnormal   Collection Time: 08/11/20  5:04 AM  Result Value Ref Range   Sodium 140 135 - 145 mmol/L   Potassium 3.7 3.5 - 5.1 mmol/L   Chloride 104 98 - 111 mmol/L   CO2 27 22 - 32 mmol/L   Glucose, Bld 111 (H) 70 - 99 mg/dL   BUN 13 6 - 20 mg/dL   Creatinine, Ser 7.78 0.44 - 1.00 mg/dL   Calcium 8.6 (L) 8.9 - 10.3 mg/dL   Total Protein 6.6 6.5 - 8.1 g/dL   Albumin 3.7 3.5 - 5.0 g/dL   AST 22 15 - 41 U/L   ALT 37 0 - 44 U/L   Alkaline Phosphatase 53 38 - 126 U/L   Total Bilirubin 0.7 0.3 - 1.2 mg/dL   GFR, Estimated >24 >23 mL/min   Anion gap 9 5 - 15  Lipase, blood     Status: None   Collection Time: 08/11/20  5:04 AM  Result Value Ref Range   Lipase 23 11 - 51 U/L  Urinalysis, Microscopic (reflex)     Status: Abnormal   Collection Time: 08/11/20  5:04 AM  Result Value Ref Range   RBC / HPF 0-5 0 - 5 RBC/hpf   WBC, UA 0-5 0 - 5 WBC/hpf   Bacteria, UA MANY (A) NONE SEEN   Squamous Epithelial / LPF 11-20 0 - 5   Mucus PRESENT    Imaging Studies: No results found.  ED COURSE and MDM  Nursing notes, initial and subsequent vitals signs,  including pulse oximetry, reviewed and interpreted by myself.  Vitals:   08/11/20 0410 08/11/20 0413  BP:  124/64  Pulse:  94  Resp:  16  Temp:  98 F (36.7 C)  TempSrc:  Oral  SpO2:  97%  Weight: 129.3 kg   Height: 5\' 1"  (1.549 m)    Medications  ondansetron (ZOFRAN-ODT) disintegrating tablet 4 mg (4 mg Oral Given 08/11/20 0503)  fentaNYL (SUBLIMAZE) injection 100 mcg (100 mcg Intravenous Given 08/11/20 0524)   5:55 AM Patient symptoms well controlled at this time.  I have a high suspicion for biliary colic and we will obtain an ultrasound of the right upper quadrant later this morning.  Patient signed out to morning EDP.   PROCEDURES  Procedures   ED DIAGNOSES     ICD-10-CM   1. RUQ abdominal pain  R10.11 14/3/21 Abdomen Limited RUQ (LIVER/GB)    US Abdomen Limited RUQ (LIVER/GB)       Trustin Chapa, MD 08/11/20 [id number]

## 2020-08-11 NOTE — Discharge Instructions (Signed)
After your trip follow back up with your primary care doctor.  If symptoms are persisting may require upper endoscopy by gastroenterology.  Would also recommend trying over-the-counter Pepto-Bismol.  Take the tramadol as needed for additional pain.  Tylenol is fine.  Would avoid medicines like Motrin or Advil.  Ultrasound without evidence of any gallbladder abnormalities.  Continue to take your current medications.

## 2020-08-11 NOTE — ED Triage Notes (Signed)
Pt is c/o upper abd pain that started on Saturday and got worse on Wednesday  Pt states she had a upper GI series yesterday  Pt states she was started on a statin a couple of weeks ago and they think that may be causing issues with her pancreas or liver  Pt states she had a fever on Wednesday night

## 2020-08-11 NOTE — ED Notes (Signed)
ED Provider at bedside. 

## 2020-08-11 NOTE — ED Notes (Signed)
Pt awaiting US

## 2020-08-11 NOTE — ED Provider Notes (Signed)
Ultrasound without evidence of any gallbladder abnormalities.  Labs very normal.  Patient with epigastric abdominal pain.  Can follow-up with primary care doctor consider upper endoscopy by gastroenterology if it does not improve.  Patient is already on medication for gastroesophageal reflux.  We will add on tramadol.   Vanetta Mulders, MD 08/11/20 1036

## 2020-08-12 LAB — URINE CULTURE

## 2020-08-15 DIAGNOSIS — Z1152 Encounter for screening for COVID-19: Secondary | ICD-10-CM | POA: Diagnosis not present

## 2020-09-10 ENCOUNTER — Telehealth: Payer: BC Managed Care – PPO | Admitting: Family

## 2020-09-10 DIAGNOSIS — R059 Cough, unspecified: Secondary | ICD-10-CM | POA: Diagnosis not present

## 2020-09-10 MED ORDER — PREDNISONE 10 MG (21) PO TBPK: ORAL_TABLET | ORAL | 0 refills | Status: DC

## 2020-09-10 MED ORDER — BENZONATATE 100 MG PO CAPS: 100.0000 mg | ORAL_CAPSULE | Freq: Three times a day (TID) | ORAL | 0 refills | Status: DC | PRN

## 2020-09-10 MED ORDER — DOXYCYCLINE HYCLATE 100 MG PO TABS: 100.0000 mg | ORAL_TABLET | Freq: Two times a day (BID) | ORAL | 0 refills | Status: DC

## 2020-09-10 NOTE — Progress Notes (Signed)
We are sorry that you are not feeling well.  Here is how we plan to help!  Based on your presentation I believe you most likely have A cough due to bacteria.  When patients have a fever and a productive cough with a change in color or increased sputum production, we are concerned about bacterial bronchitis.  If left untreated it can progress to pneumonia.  If your symptoms do not improve with your treatment plan it is important that you contact your provider.   I have prescribed Doxycycline 100 mg twice a day for 7 days     In addition you may use A non-prescription cough medication called Robitussin DAC. Take 2 teaspoons every 8 hours or Delsym: take 2 teaspoons every 12 hours., A non-prescription cough medication called Mucinex DM: take 2 tablets every 12 hours. and A prescription cough medication called Tessalon Perles 100mg. You may take 1-2 capsules every 8 hours as needed for your cough.  Prednisone 10 mg daily for 6 days (see taper instructions below)  Directions for 6 day taper: Day 1: 2 tablets before breakfast, 1 after both lunch & dinner and 2 at bedtime Day 2: 1 tab before breakfast, 1 after both lunch & dinner and 2 at bedtime Day 3: 1 tab at each meal & 1 at bedtime Day 4: 1 tab at breakfast, 1 at lunch, 1 at bedtime Day 5: 1 tab at breakfast & 1 tab at bedtime Day 6: 1 tab at breakfast   From your responses in the eVisit questionnaire you describe inflammation in the upper respiratory tract which is causing a significant cough.  This is commonly called Bronchitis and has four common causes:    Allergies  Viral Infections  Acid Reflux  Bacterial Infection Allergies, viruses and acid reflux are treated by controlling symptoms or eliminating the cause. An example might be a cough caused by taking certain blood pressure medications. You stop the cough by changing the medication. Another example might be a cough caused by acid reflux. Controlling the reflux helps control the  cough.  USE OF BRONCHODILATOR ("RESCUE") INHALERS: There is a risk from using your bronchodilator too frequently.  The risk is that over-reliance on a medication which only relaxes the muscles surrounding the breathing tubes can reduce the effectiveness of medications prescribed to reduce swelling and congestion of the tubes themselves.  Although you feel brief relief from the bronchodilator inhaler, your asthma may actually be worsening with the tubes becoming more swollen and filled with mucus.  This can delay other crucial treatments, such as oral steroid medications. If you need to use a bronchodilator inhaler daily, several times per day, you should discuss this with your provider.  There are probably better treatments that could be used to keep your asthma under control.     HOME CARE . Only take medications as instructed by your medical team. . Complete the entire course of an antibiotic. . Drink plenty of fluids and get plenty of rest. . Avoid close contacts especially the very young and the elderly . Cover your mouth if you cough or cough into your sleeve. . Always remember to wash your hands . A steam or ultrasonic humidifier can help congestion.   GET HELP RIGHT AWAY IF: . You develop worsening fever. . You become short of breath . You cough up blood. . Your symptoms persist after you have completed your treatment plan MAKE SURE YOU   Understand these instructions.  Will watch your condition.    Will get help right away if you are not doing well or get worse.  Your e-visit answers were reviewed by a board certified advanced clinical practitioner to complete your personal care plan.  Depending on the condition, your plan could have included both over the counter or prescription medications. If there is a problem please reply  once you have received a response from your provider. Your safety is important to us.  If you have drug allergies check your prescription carefully.     You can use MyChart to ask questions about today's visit, request a non-urgent call back, or ask for a work or school excuse for 24 hours related to this e-Visit. If it has been greater than 24 hours you will need to follow up with your provider, or enter a new e-Visit to address those concerns. You will get an e-mail in the next two days asking about your experience.  I hope that your e-visit has been valuable and will speed your recovery. Thank you for using e-visits.  Approximately 5 minutes was spent documenting and reviewing patient's chart.   

## 2020-09-22 ENCOUNTER — Other Ambulatory Visit: Payer: BC Managed Care – PPO

## 2020-09-27 DIAGNOSIS — F4310 Post-traumatic stress disorder, unspecified: Secondary | ICD-10-CM | POA: Diagnosis not present

## 2020-10-03 DIAGNOSIS — E890 Postprocedural hypothyroidism: Secondary | ICD-10-CM | POA: Diagnosis not present

## 2020-10-06 DIAGNOSIS — E785 Hyperlipidemia, unspecified: Secondary | ICD-10-CM | POA: Diagnosis not present

## 2020-10-06 DIAGNOSIS — E890 Postprocedural hypothyroidism: Secondary | ICD-10-CM | POA: Diagnosis not present

## 2020-10-06 DIAGNOSIS — R000 Tachycardia, unspecified: Secondary | ICD-10-CM | POA: Diagnosis not present

## 2020-10-06 DIAGNOSIS — E669 Obesity, unspecified: Secondary | ICD-10-CM | POA: Diagnosis not present

## 2020-10-13 DIAGNOSIS — E785 Hyperlipidemia, unspecified: Secondary | ICD-10-CM | POA: Diagnosis not present

## 2020-10-19 ENCOUNTER — Other Ambulatory Visit: Payer: Self-pay | Admitting: Family Medicine

## 2020-11-15 DIAGNOSIS — F4310 Post-traumatic stress disorder, unspecified: Secondary | ICD-10-CM | POA: Diagnosis not present

## 2020-12-06 ENCOUNTER — Other Ambulatory Visit: Payer: BC Managed Care – PPO

## 2020-12-15 ENCOUNTER — Ambulatory Visit: Payer: BC Managed Care – PPO

## 2020-12-15 ENCOUNTER — Other Ambulatory Visit: Payer: Self-pay

## 2020-12-15 DIAGNOSIS — Z0189 Encounter for other specified special examinations: Secondary | ICD-10-CM | POA: Diagnosis not present

## 2020-12-15 DIAGNOSIS — R000 Tachycardia, unspecified: Secondary | ICD-10-CM

## 2021-01-02 DIAGNOSIS — R059 Cough, unspecified: Secondary | ICD-10-CM | POA: Diagnosis not present

## 2021-01-12 ENCOUNTER — Other Ambulatory Visit: Payer: Self-pay | Admitting: Family Medicine

## 2021-01-27 ENCOUNTER — Telehealth: Payer: BC Managed Care – PPO | Admitting: Physician Assistant

## 2021-01-27 ENCOUNTER — Encounter: Payer: Self-pay | Admitting: Physician Assistant

## 2021-01-27 DIAGNOSIS — U071 COVID-19: Secondary | ICD-10-CM | POA: Diagnosis not present

## 2021-01-27 MED ORDER — METOPROLOL SUCCINATE ER 50 MG PO TB24: 25.0000 mg | ORAL_TABLET | Freq: Every day | ORAL | 1 refills | Status: DC

## 2021-01-27 NOTE — Progress Notes (Signed)
Jessica Silva are scheduled for a virtual visit with your provider today.    Just as we do with appointments in the office, we must obtain your consent to participate.  Your consent will be active for this visit and any virtual visit you may have with one of our providers in the next 365 days.    If you have a MyChart account, I can also send a copy of this consent to you electronically.  All virtual visits are billed to your insurance company just like a traditional visit in the office.  As this is a virtual visit, video technology does not allow for your provider to perform a traditional examination.  This may limit your provider's ability to fully assess your condition.  If your provider identifies any concerns that need to be evaluated in person or the need to arrange testing such as labs, EKG, etc, we will make arrangements to do so.    Although advances in technology are sophisticated, we cannot ensure that it will always work on either your end or our end.  If the connection with a video visit is poor, we may have to switch to a telephone visit.  With either a video or telephone visit, we are not always able to ensure that we have a secure connection.   I need to obtain your verbal consent now.   Are you willing to proceed with your visit today?   Jessica Silva has provided verbal consent on 01/27/2021 for a virtual visit (video or telephone).   Jessica Silva 01/27/2021  9:45 AM    MyChart Video Visit    Virtual Visit via Video Note   This visit type was conducted due to national recommendations for restrictions regarding the COVID-19 Pandemic (e.g. social distancing) in an effort to limit this patient's exposure and mitigate transmission in our community. This patient is at least at moderate risk for complications without adequate follow up. This format is felt to be most appropriate for this patient at this time. Physical exam was limited by quality of the video and audio  technology used for the visit.   Patient location: Home Provider location: Home office in [place]  I discussed the limitations of evaluation and management by telemedicine and the availability of in person appointments. The patient expressed understanding and agreed to proceed.  Patient: Jessica Silva   DOB: 09/19/1985   35 y.o. Female  MRN: [id number] Visit Date: 01/27/2021  Today's healthcare provider: Margaretann Loveless, Silva   No chief complaint on file.  Subjective    URI  This is a new problem. Episode onset: Tested positive for covid on Wednesday, 01/24/21, symptoms started 01/23/21. The problem has been gradually worsening. The maximum temperature recorded prior to her arrival was 102 - 102.9 F. The fever has been present for 3 to 4 days (none today). Associated symptoms include congestion, coughing (dry, deep), ear pain (left), headaches, nausea (on wednesday, none since), rhinorrhea, sinus pain, a sore throat and swollen glands. Pertinent negatives include no abdominal pain, vomiting or wheezing. She has tried decongestant, increased fluids and NSAIDs (Coricidin HBP and advil) for the symptoms. The treatment provided mild relief.    Patient Active Problem List   Diagnosis Date Noted  . Palpitations 06/30/2020  . Tachycardia 06/24/2020  . S/P cesarean section 04/19/2018  . Pregnant 04/17/2018  . Thyroid disease during pregnancy in second trimester   . [redacted] weeks gestation of pregnancy   . Obesity complicating  pregnancy in second trimester   . Encounter for screening for coronary artery disease   . 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 Depression, recurrent (HCC) 07/17/2015  . Weight gain 07/14/2013   Past Medical History:  Diagnosis Date  . Abnormal uterine bleeding   . Amenorrhea   . Anxiety   . Depression   . GERD (gastroesophageal reflux disease)   . Hashimoto's thyroiditis   . Hormone disorder   . Infertility, female   . PONV (postoperative nausea and  vomiting)   . Premature ovarian failure 2002  . Psychogenic air hunger       Medications: Outpatient Medications Prior to Visit  Medication Sig  . acetaminophen (TYLENOL) 325 MG tablet Take 325 mg by mouth every 6 (six) hours as needed for headache.  . benzonatate (TESSALON PERLES) 100 MG capsule Take 1 capsule (100 mg total) by mouth 3 (three) times daily as needed.  Marland Kitchen buPROPion (WELLBUTRIN XL) 300 MG 24 hr tablet TAKE 1 TABLET BY MOUTH EVERY DAY  . doxycycline (VIBRA-TABS) 100 MG tablet Take 1 tablet (100 mg total) by mouth 2 (two) times daily.  Marland Kitchen ibuprofen (ADVIL,MOTRIN) 800 MG tablet Take 1 tablet (800 mg total) by mouth every 8 (eight) hours as needed.  Marland Kitchen levothyroxine (SYNTHROID) 175 MCG tablet Take 175 mcg by mouth daily.  . metoprolol succinate (TOPROL-XL) 50 MG 24 hr tablet Take 2 tablets (100 mg total) by mouth in the morning and at bedtime. Take with or immediately following a meal. (Patient taking differently: Take 50 mg by mouth in the morning and at bedtime. Take with or immediately following a meal.)  . Multiple Vitamins-Minerals (MULTIVITAMIN ADULTS PO) Take 1 tablet by mouth daily.  . predniSONE (STERAPRED UNI-PAK 21 TAB) 10 MG (21) TBPK tablet Use as directed  . Probiotic Product (PROBIOTIC-10 PO) Take 1 tablet by mouth daily.  . rosuvastatin (CRESTOR) 10 MG tablet Take 1 tablet (10 mg total) by mouth daily.  . traMADol (ULTRAM) 50 MG tablet TAKE 1 TABLET BY MOUTH EVERY 6 HOURS AS NEEDED FOR MODERATE PAIN   No facility-administered medications prior to visit.    Review of Systems  Constitutional: Positive for appetite change, chills, fatigue and fever.  HENT: Positive for congestion, ear pain (left), postnasal drip, rhinorrhea, sinus pressure, sinus pain and sore throat.   Respiratory: Positive for cough (dry, deep). Negative for chest tightness, shortness of breath and wheezing.   Cardiovascular: Negative.   Gastrointestinal: Positive for nausea (on wednesday, none  since). Negative for abdominal pain and vomiting.  Neurological: Positive for headaches.    Last CBC Lab Results  Component Value Date   WBC 7.8 08/11/2020   HGB 13.9 08/11/2020   HCT 40.9 08/11/2020   MCV 89.1 08/11/2020   MCH 30.3 08/11/2020   RDW 12.0 08/11/2020   PLT 225 08/11/2020   Last metabolic panel Lab Results  Component Value Date   GLUCOSE 111 (H) 08/11/2020   NA 140 08/11/2020   K 3.7 08/11/2020   CL 104 08/11/2020   CO2 27 08/11/2020   BUN 13 08/11/2020   CREATININE 0.80 08/11/2020   GFRNONAA >60 08/11/2020   GFRAA >60 10/22/2019   CALCIUM 8.6 (L) 08/11/2020   PROT 6.6 08/11/2020   ALBUMIN 3.7 08/11/2020   BILITOT 0.7 08/11/2020   ALKPHOS 53 08/11/2020   AST 22 08/11/2020   ALT 37 08/11/2020   ANIONGAP 9 08/11/2020      Objective    There were no vitals taken for this  visit. BP Readings from Last 3 Encounters:  08/11/20 127/65  06/30/20 131/80  04/21/20 126/72   Wt Readings from Last 3 Encounters:  08/11/20 285 lb (129.3 kg)  06/30/20 287 lb (130.2 kg)  04/21/20 287 lb 9.6 oz (130.5 kg)      Physical Exam Vitals reviewed.  Constitutional:      General: She is not in acute distress.    Appearance: Normal appearance. She is well-developed. She is not ill-appearing.  HENT:     Head: Normocephalic and atraumatic.  Pulmonary:     Effort: Pulmonary effort is normal. No respiratory distress (dry cough heard x 2; no difficulties speaking in full, complete, coherent sentences).  Musculoskeletal:     Cervical back: Normal range of motion and neck supple.  Neurological:     Mental Status: She is alert.  Psychiatric:        Mood and Affect: Mood normal.        Behavior: Behavior normal.        Thought Content: Thought content normal.        Judgment: Judgment normal.        Assessment & Plan     1. COVID-19 - Continue OTC symptomatic management of choice - Will send OTC vitamins and supplement information through AVS - Patient enrolled  in MyChart symptom monitoring - Push fluids - Rest as needed - Referral to Covid treatment team placed, consult appreciated - Discussed return precautions and when to seek in-person evaluation, sent via AVS as well - Ambulatory referral for Covid Treatment   No follow-ups on file.     I discussed the assessment and treatment plan with the patient. The patient was provided an opportunity to ask questions and all were answered. The patient agreed with the plan and demonstrated an understanding of the instructions.   The patient was advised to call back or seek an in-person evaluation if the symptoms worsen or if the condition fails to improve as anticipated.  I provided 17 minutes of face-to-face time during this encounter via MyChart Video enabled encounter.   Reine Just [facility] [phone] (phone) [phone] (fax)  [facility]

## 2021-01-27 NOTE — Patient Instructions (Signed)
Hello Jessica Silva,  You are being placed in the home monitoring program for COVID-19 (commonly known as Coronavirus).  This is because you are suspected to have the virus or are known to have the virus.  If you are unsure which group you fall into call your clinic.    As part of this program, you'll answer a daily questionnaire in the MyChart mobile app. You'll receive a notification through the MyChart app when the questionnaire is available. When you log in to MyChart, you'll see the tasks in your To Do activity.       Clinicians will see any answers that are concerning and take appropriate steps.  If at any point you are having a medical emergency, call 911.  If otherwise concerned call your clinic instead of coming into the clinic or hospital.  To keep from spreading the disease you should: Stay home and limit contact with other people as much as possible.  Wash your hands frequently. Cover your coughs and sneezes with a tissue, and throw used tissues in the trash.   Clean and disinfect frequently touched surfaces and objects.    Take care of yourself by: Staying home Resting Drinking fluids Take fever-reducing medications (Tylenol/Acetaminophen and Ibuprofen)  For more information on the disease go to the Centers for Disease Control and Prevention website     Can take to lessen severity: Vit C 500mg  twice daily Quercertin 250-500mg  twice daily Zinc 75-100mg  daily Melatonin 3-6 mg at bedtime Vit D3 1000-2000 IU daily Aspirin 81 mg daily with food Optional: Famotidine 20mg  daily Also can add tylenol/ibuprofen as needed for fevers and body aches May add Mucinex or Mucinex DM as needed for cough/congestion   10 Things You Can Do to Manage Your COVID-19 Symptoms at Home If you have possible or confirmed COVID-19: 1. Stay home except to get medical care. 2. Monitor your symptoms carefully. If your symptoms get worse, call your healthcare provider immediately. 3. Get rest and stay  hydrated. 4. If you have a medical appointment, call the healthcare provider ahead of time and tell them that you have or may have COVID-19. 5. For medical emergencies, call 911 and notify the dispatch personnel that you have or may have COVID-19. 6. Cover your cough and sneezes with a tissue or use the inside of your elbow. 7. Wash your hands often with soap and water for at least 20 seconds or clean your hands with an alcohol-based hand sanitizer that contains at least 60% alcohol. 8. As much as possible, stay in a specific room and away from other people in your home. Also, you should use a separate bathroom, if available. If you need to be around other people in or outside of the home, wear a mask. 9. Avoid sharing personal items with other people in your household, like dishes, towels, and bedding. 10. Clean all surfaces that are touched often, like counters, tabletops, and doorknobs. Use household cleaning sprays or wipes according to the label instructions. 03/24/2020 This information is not intended to replace advice given to you by your health care provider. Make sure you discuss any questions you have with your health care provider. Document Revised: 07/10/2020 Document Reviewed: 07/10/2020 Elsevier Patient Education  2021 Elsevier Inc.   COVID-19: What to Do if You Are Sick If you have a fever, cough or other symptoms, you might have COVID-19. Most people have mild illness and are able to recover at home. If you are sick:  Keep track of your  symptoms.  If you have an emergency warning sign (including trouble breathing), call 911. Steps to help prevent the spread of COVID-19 if you are sick If you are sick with COVID-19 or think you might have COVID-19, follow the steps below to care for yourself and to help protect other people in your home and community. Stay home except to get medical care  Stay home. Most people with COVID-19 have mild illness and can  recover at home without medical care. Do not leave your home, except to get medical care. Do not visit public areas.  Take care of yourself. Get rest and stay hydrated. Take over-the-counter medicines, such as acetaminophen, to help you feel better.  Stay in touch with your doctor. Call before you get medical care. Be sure to get care if you have trouble breathing, or have any other emergency warning signs, or if you think it is an emergency.  Avoid public transportation, ride-sharing, or taxis. Separate yourself from other people As much as possible, stay in a specific room and away from other people and pets in your home. If possible, you should use a separate bathroom. If you need to be around other people or animals in or outside of the home, wear a mask. Tell your close contactsthat they may have been exposed to COVID-19. An infected person can spread COVID-19 starting 48 hours (or 2 days) before the person has any symptoms or tests positive. By letting your close contacts know they may have been exposed to COVID-19, you are helping to protect everyone.  Additional guidance is available for those living in close quarters and shared housing.  See COVID-19 and Animals if you have questions about pets.  If you are diagnosed with COVID-19, someone from the health department may call you. Answer the call to slow the spread. Monitor your symptoms  Symptoms of COVID-19 include fever, cough, or other symptoms.  Follow care instructions from your healthcare provider and local health department. Your local health authorities may give instructions on checking your symptoms and reporting information. When to seek emergency medical attention Look for emergency warning signs* for COVID-19. If someone is showing any of these signs, seek emergency medical care immediately:  Trouble breathing  Persistent pain or pressure in the chest  New confusion  Inability to wake or stay awake  Pale, gray, or  blue-colored skin, lips, or nail beds, depending on skin tone *This list is not all possible symptoms. Please call your medical provider for any other symptoms that are severe or concerning to you. Call 911 or call ahead to your local emergency facility: Notify the operator that you are seeking care for someone who has or may have COVID-19. Call ahead before visiting your doctor  Call ahead. Many medical visits for routine care are being postponed or done by phone or telemedicine.  If you have a medical appointment that cannot be postponed, call your doctor's office, and tell them you have or may have COVID-19. This will help the office protect themselves and other patients. Get  tested  If you have symptoms of COVID-19, get tested. While waiting for test results, you stay away from others, including staying apart from those living in your household.  You can visit your state, tribal, local, and territorialhealth department's website to look for the latest local information on testing sites. If you are sick, wear a mask over your nose and mouth  You should wear a mask over your nose and mouth if you must  be around other people or animals, including pets (even at home).  You don't need to wear the mask if you are alone. If you can't put on a mask (because of trouble breathing, for example), cover your coughs and sneezes in some other way. Try to stay at least 6 feet away from other people. This will help protect the people around you.  Masks should not be placed on young children under age 53 years, anyone who has trouble breathing, or anyone who is not able to remove the mask without help. Note: During the COVID-19 pandemic, medical grade facemasks are reserved for healthcare workers and some first responders. Cover your coughs and sneezes  Cover your mouth and nose with a tissue when you cough or sneeze.  Throw away used tissues in a lined trash can.  Immediately wash your hands with soap  and water for at least 20 seconds. If soap and water are not available, clean your hands with an alcohol-based hand sanitizer that contains at least 60% alcohol. Clean your hands often  Wash your hands often with soap and water for at least 20 seconds. This is especially important after blowing your nose, coughing, or sneezing; going to the bathroom; and before eating or preparing food.  Use hand sanitizer if soap and water are not available. Use an alcohol-based hand sanitizer with at least 60% alcohol, covering all surfaces of your hands and rubbing them together until they feel dry.  Soap and water are the best option, especially if hands are visibly dirty.  Avoid touching your eyes, nose, and mouth with unwashed hands.  Handwashing Tips Avoid sharing personal household items  Do not share dishes, drinking glasses, cups, eating utensils, towels, or bedding with other people in your home.  Wash these items thoroughly after using them with soap and water or put in the dishwasher. Clean all "high-touch" surfaces everyday  Clean and disinfect high-touch surfaces in your "sick room" and bathroom; wear disposable gloves. Let someone else clean and disinfect surfaces in common areas, but you should clean your bedroom and bathroom, if possible.  If a caregiver or other person needs to clean and disinfect a sick person's bedroom or bathroom, they should do so on an as-needed basis. The caregiver/other person should wear a mask and disposable gloves prior to cleaning. They should wait as long as possible after the person who is sick has used the bathroom before coming in to clean and use the bathroom. ? High-touch surfaces include phones, remote controls, counters, tabletops, doorknobs, bathroom fixtures, toilets, keyboards, tablets, and bedside tables.  Clean and disinfect areas that may have blood, stool, or body fluids on them.  Use household cleaners and disinfectants. Clean the area or item  with soap and water or another detergent if it is dirty. Then, use a household disinfectant. ? Be sure to follow the instructions on the label to ensure safe and effective use of the product. Many products recommend keeping the surface wet for several minutes to ensure germs are killed. Many also recommend precautions such as wearing gloves and making sure you have good ventilation during use of the product. ? Use a product from [employer] List N: Disinfectants for Coronavirus (URKYH-06). ? Complete Disinfection Guidance When you can be around others after being sick with COVID-19 Deciding when you can be around others is different for different situations. Find out when you can safely end home isolation. For any additional questions about your care, contact your healthcare provider or state  or local health department. 11/24/2019 Content source: [facility] for Immunization and Respiratory Diseases ([facility]), Division of Viral Diseases This information is not intended to replace advice given to you by your health care provider. Make sure you discuss any questions you have with your health care provider. Document Revised: 07/10/2020 Document Reviewed: 07/10/2020 Elsevier Patient Education  2021 [employer].

## 2021-01-28 ENCOUNTER — Telehealth: Payer: Self-pay | Admitting: Nurse Practitioner

## 2021-01-28 NOTE — Telephone Encounter (Signed)
Called to discuss with patient about COVID-19 symptoms and the use of one of the available treatments for those with mild to moderate Covid symptoms and at a high risk of hospitalization.  Pt appears to qualify for outpatient treatment due to co-morbid conditions and/or a member of an at-risk group in accordance with the FDA Emergency Use Authorization.    Symptom onset: unnknown Vaccinated: not on file Booster? Not on file Immunocompromised?no  Qualifiers: obesity, hypothyroidism NIH Criteria: 4  Unable to reach pt - Voicemail and Mychart message sent.   Willette Alma, NP COVID Treatment Team [phone]

## 2021-01-31 ENCOUNTER — Telehealth: Payer: BC Managed Care – PPO | Admitting: Emergency Medicine

## 2021-01-31 DIAGNOSIS — U071 COVID-19: Secondary | ICD-10-CM

## 2021-01-31 NOTE — Progress Notes (Signed)
Chante,  Thank you for the additional information.   Based on what you shared with me, in addition to your Covid questionnaire mentioning worse cough yesterday and now developing lower back pain, I feel your condition warrants further evaluation and I recommend that you be seen in a face to face office visit.   NOTE: If you entered your credit card information for this eVisit, you will not be charged. You may see a "hold" on your card for the $35 but that hold will drop off and you will not have a charge processed.   If you are having a true medical emergency please call 911.      For an urgent face to face visit, [facility] has six urgent care centers for your convenience:     [facility] at [facility] Directions [phone] [address] [place], [place] [address] . 8 am - 4 pm Monday - Friday    [facility]) Get Driving Directions [phone] [address] [place], [place] [address] . 8 am to 8 pm Monday-Friday . 10 am to 6 pm [facility] [address] [facility] Urgent [facility]) Get Driving Directions [phone]  [address] [place],  [place]  [address] . 8 am to 8 pm Monday-Friday . 8 am to 4 pm [facility] at [facility] Get Driving Directions [phone] [address] [place], [place] [address] . 8 am to 8 pm Monday-Friday . 8 am to 4 pm [facility] Urgent Care at [facility] Get Driving Directions  [phone] [address] [place], [place] [address] . 8 am to 8 pm Monday-Friday . 8 am to 4 pm [facility] at [facility] Directions [phone] [address] [place], [place] [address] . 8 am to 8 pm Monday-Friday . 8 am to 4 pm Saturday-Sunday     Your MyChart E-visit questionnaire answers were reviewed  by a board certified advanced clinical practitioner to complete your personal care plan based on your specific symptoms.  Thank you for using e-Visits.    Approximately 5 minutes was spent documenting and reviewing patient's chart.

## 2021-02-02 ENCOUNTER — Other Ambulatory Visit: Payer: Self-pay

## 2021-02-02 ENCOUNTER — Telehealth (INDEPENDENT_AMBULATORY_CARE_PROVIDER_SITE_OTHER): Payer: BC Managed Care – PPO | Admitting: Family Medicine

## 2021-02-02 DIAGNOSIS — U071 COVID-19: Secondary | ICD-10-CM | POA: Diagnosis not present

## 2021-02-02 DIAGNOSIS — Z713 Dietary counseling and surveillance: Secondary | ICD-10-CM | POA: Diagnosis not present

## 2021-02-02 DIAGNOSIS — J019 Acute sinusitis, unspecified: Secondary | ICD-10-CM

## 2021-02-02 MED ORDER — DOXYCYCLINE HYCLATE 100 MG PO TABS: 100.0000 mg | ORAL_TABLET | Freq: Two times a day (BID) | ORAL | 0 refills | Status: DC

## 2021-02-02 NOTE — Progress Notes (Signed)
Patient ID: Jessica Silva, female   DOB: 20-Jul-1986, 35 y.o.   MRN: [id number]   This visit type was conducted due to national recommendations for restrictions regarding the COVID-19 pandemic in an effort to limit this patient's exposure and mitigate transmission in our community.   Virtual Visit via Video Note  I [name] on 02/02/21 at  3:15 PM EDT by a video enabled telemedicine application and verified that I am speaking with the correct person using two identifiers.  Location patient: home Location provider:work or home office Persons participating in the virtual visit: patient, provider  I discussed the limitations of evaluation and management by telemedicine and the availability of in person appointments. The patient expressed understanding and agreed to proceed.   HPI: Jessica Silva was diagnosed with COVID-19 about 9 days ago.  She has some residual dry cough.  She did virtual visit on Saturday and was prescribed steroids.  She had some leftover promethazine which she has used for cough.   She is doing reasonly well and feels like overall she is some better but she is developing some facial pain and colored nasal discharge he was concerned about developing sinusitis.  Those symptoms have worsened for the past few days.  Still no fever.  Her husband and child also had COVID but they are improved at this time.   ROS: See pertinent positives and negatives per HPI.  Past Medical History:  Diagnosis Date  . Abnormal uterine bleeding   . Amenorrhea   . Anxiety   . Depression   . GERD (gastroesophageal reflux disease)   . Hashimoto's thyroiditis   . Hormone disorder   . Infertility, female   . PONV (postoperative nausea and vomiting)   . Premature ovarian failure 2002  . Psychogenic air hunger     Past Surgical History:  Procedure Laterality Date  . CESAREAN SECTION N/A 04/19/2018   Procedure: CESAREAN SECTION;  Surgeon: Sherian Rein, MD;  Location: [facility];  Service: Obstetrics;  Laterality: N/A;  . DILATATION & CURETTAGE/HYSTEROSCOPY WITH MYOSURE N/A 05/07/2016   Procedure: DILATATION & CURETTAGE/HYSTEROSCOPY WITH MYOSURE;  Surgeon: Romualdo Bolk, MD;  Location: [facility];  Service: Gynecology;  Laterality: N/A;  . DILATION AND CURETTAGE OF UTERUS    . radioactive iodine treatment     thyroid  . TONSILLECTOMY    . TONSILLECTOMY AND ADENOIDECTOMY    . WISDOM TOOTH EXTRACTION      Family History  Problem Relation Age of Onset  . Hiatal hernia Mother   . Atrial fibrillation Father   . Hypertension Sister   . Polycystic ovary syndrome Sister   . Irritable bowel syndrome Brother   . Hiatal hernia Maternal Grandmother   . Irritable bowel syndrome Brother     SOCIAL HX: Non-smoker   Current Outpatient Medications:  .  doxycycline (VIBRA-TABS) 100 MG tablet, Take 1 tablet (100 mg total) by mouth 2 (two) times daily., Disp: 20 tablet, Rfl: 0 .  acetaminophen (TYLENOL) 325 MG tablet, Take 325 mg by mouth every 6 (six) hours as needed for headache., Disp: , Rfl:  .  buPROPion (WELLBUTRIN XL) 300 MG 24 hr tablet, TAKE 1 TABLET BY MOUTH EVERY DAY, Disp: 90 tablet, Rfl: 0 .  ibuprofen (ADVIL,MOTRIN) 800 MG tablet, Take 1 tablet (800 mg total) by mouth every 8 (eight) hours as needed., Disp: 45 tablet, Rfl: 1 .  levothyroxine (SYNTHROID) 175 MCG tablet, Take 175 mcg by mouth daily., Disp: , Rfl:  .  metoprolol succinate (TOPROL-XL) 50 MG 24 hr tablet, Take 0.5 tablets (25 mg total) by mouth daily. Take with or immediately following a meal., Disp: 180 tablet, Rfl: 1 .  Multiple Vitamins-Minerals (MULTIVITAMIN ADULTS PO), Take 1 tablet by mouth daily., Disp: , Rfl:  .  Probiotic Product (PROBIOTIC-10 PO), Take 1 tablet by mouth daily., Disp: , Rfl:  .  rosuvastatin (CRESTOR) 10 MG tablet, Take 1 tablet (10 mg total) by mouth daily., Disp: 90 tablet, Rfl: 3 .  traMADol (ULTRAM) 50 MG tablet, TAKE 1 TABLET BY MOUTH EVERY 6 HOURS AS  NEEDED FOR MODERATE PAIN, Disp: 15 tablet, Rfl: 0  EXAM:  VITALS per patient if applicable:  GENERAL: alert, oriented, appears well and in no acute distress  HEENT: atraumatic, conjunttiva clear, no obvious abnormalities on inspection of external nose and ears  NECK: normal movements of the head and neck  LUNGS: on inspection no signs of respiratory distress, breathing rate appears normal, no obvious gross SOB, gasping or wheezing  CV: no obvious cyanosis  MS: moves all visible extremities without noticeable abnormality  PSYCH/NEURO: pleasant and cooperative, no obvious depression or anxiety, speech and thought processing grossly intact  ASSESSMENT AND PLAN:  Discussed the following assessment and plan:  Recent COVID-19 infection.  Question is whether she has developed concomitant sinusitis.  She has several symptoms suggesting possible sinusitis.  -We discussed covering with doxycycline 100 mg twice daily for 10 days.  Reviewed potential side effects.  She has penicillin allergy.  Stay well-hydrated.  Touch base for any persistent or worsening symptoms.     I discussed the assessment and treatment plan with the patient. The patient was provided an opportunity to ask questions and all were answered. The patient agreed with the plan and demonstrated an understanding of the instructions.   The patient was advised to call back or seek an in-person evaluation if the symptoms worsen or if the condition fails to improve as anticipated.     [name], MD

## 2021-02-09 DIAGNOSIS — Z713 Dietary counseling and surveillance: Secondary | ICD-10-CM | POA: Diagnosis not present

## 2021-02-20 ENCOUNTER — Telehealth: Payer: BC Managed Care – PPO | Admitting: Physician Assistant

## 2021-02-20 DIAGNOSIS — J40 Bronchitis, not specified as acute or chronic: Secondary | ICD-10-CM | POA: Diagnosis not present

## 2021-02-20 MED ORDER — BENZONATATE 100 MG PO CAPS: 100.0000 mg | ORAL_CAPSULE | Freq: Three times a day (TID) | ORAL | 0 refills | Status: DC | PRN

## 2021-02-20 MED ORDER — PREDNISONE 10 MG (21) PO TBPK: ORAL_TABLET | ORAL | 0 refills | Status: DC

## 2021-02-20 NOTE — Progress Notes (Signed)
We are sorry that you are not feeling well.  Here is how we plan to help!  Based on your presentation I believe you most likely have A cough due to a virus.  This is called viral bronchitis and is best treated by rest, plenty of fluids and control of the cough.  You may use Ibuprofen or Tylenol as directed to help your symptoms.     In addition you may use A prescription cough medication called Tessalon Perles 100mg. You may take 1-2 capsules every 8 hours as needed for your cough.  Prednisone 10 mg daily for 6 days (see taper instructions below)  Directions for 6 day taper: Day 1: 2 tablets before breakfast, 1 after both lunch & dinner and 2 at bedtime Day 2: 1 tab before breakfast, 1 after both lunch & dinner and 2 at bedtime Day 3: 1 tab at each meal & 1 at bedtime Day 4: 1 tab at breakfast, 1 at lunch, 1 at bedtime Day 5: 1 tab at breakfast & 1 tab at bedtime Day 6: 1 tab at breakfast   From your responses in the eVisit questionnaire you describe inflammation in the upper respiratory tract which is causing a significant cough.  This is commonly called Bronchitis and has four common causes:    Allergies  Viral Infections  Acid Reflux  Bacterial Infection Allergies, viruses and acid reflux are treated by controlling symptoms or eliminating the cause. An example might be a cough caused by taking certain blood pressure medications. You stop the cough by changing the medication. Another example might be a cough caused by acid reflux. Controlling the reflux helps control the cough.  USE OF BRONCHODILATOR ("RESCUE") INHALERS: There is a risk from using your bronchodilator too frequently.  The risk is that over-reliance on a medication which only relaxes the muscles surrounding the breathing tubes can reduce the effectiveness of medications prescribed to reduce swelling and congestion of the tubes themselves.  Although you feel brief relief from the bronchodilator inhaler, your asthma may  actually be worsening with the tubes becoming more swollen and filled with mucus.  This can delay other crucial treatments, such as oral steroid medications. If you need to use a bronchodilator inhaler daily, several times per day, you should discuss this with your provider.  There are probably better treatments that could be used to keep your asthma under control.     HOME CARE . Only take medications as instructed by your medical team. . Complete the entire course of an antibiotic. . Drink plenty of fluids and get plenty of rest. . Avoid close contacts especially the very young and the elderly . Cover your mouth if you cough or cough into your sleeve. . Always remember to wash your hands . A steam or ultrasonic humidifier can help congestion.   GET HELP RIGHT AWAY IF: . You develop worsening fever. . You become short of breath . You cough up blood. . Your symptoms persist after you have completed your treatment plan MAKE SURE YOU   Understand these instructions.  Will watch your condition.  Will get help right away if you are not doing well or get worse.  Your e-visit answers were reviewed by a board certified advanced clinical practitioner to complete your personal care plan.  Depending on the condition, your plan could have included both over the counter or prescription medications. If there is a problem please reply  once you have received a response from your provider. Your   safety is important to us.  If you have drug allergies check your prescription carefully.    You can use MyChart to ask questions about today's visit, request a non-urgent call back, or ask for a work or school excuse for 24 hours related to this e-Visit. If it has been greater than 24 hours you will need to follow up with your provider, or enter a new e-Visit to address those concerns. You will get an e-mail in the next two days asking about your experience.  I hope that your e-visit has been valuable and will  speed your recovery. Thank you for using e-visits.  I provided 5 minutes of non face-to-face time during this encounter for chart review and documentation.   

## 2021-02-23 DIAGNOSIS — Z713 Dietary counseling and surveillance: Secondary | ICD-10-CM | POA: Diagnosis not present

## 2021-03-02 ENCOUNTER — Ambulatory Visit: Payer: BC Managed Care – PPO | Admitting: Family Medicine

## 2021-03-02 ENCOUNTER — Encounter: Payer: Self-pay | Admitting: Family Medicine

## 2021-03-02 ENCOUNTER — Other Ambulatory Visit: Payer: Self-pay

## 2021-03-02 VITALS — BP 124/70 | HR 81 | Temp 98.0°F | Wt 297.0 lb

## 2021-03-02 DIAGNOSIS — R059 Cough, unspecified: Secondary | ICD-10-CM

## 2021-03-02 NOTE — Patient Instructions (Signed)
Follow up for any shortness of breath or if cough not fully resolved next couple of weeks

## 2021-03-02 NOTE — Progress Notes (Signed)
Established Patient Office Visit  Subjective:  Patient ID: Jessica Silva, female    DOB: [birth year]  Age: 35 y.o. MRN: [id number]  CC:  Chief Complaint  Patient presents with   chest congestion    Had covid 1 month ago, still having some wheezing and chest pressure.     HPI Jessica Silva presents for some upper airway congestion.  She had COVID about a month ago.  She feels like she has had some mild wheezing with expiration recently but no dyspnea.  Really very minimal cough.  No fever.  No significant dyspnea at rest and only occasionally with activity.  Overall feels well.  Non-smoker.  She smoked lightly until 2015.  She recently had albuterol inhaler sent in through virtual visit and did apparently some prednisone and she is not sure that the albuterol helped much.    Past Medical History:  Diagnosis Date   Abnormal uterine bleeding    Amenorrhea    Anxiety    Depression    GERD (gastroesophageal reflux disease)    Hashimoto's thyroiditis    Hormone disorder    Infertility, female    PONV (postoperative nausea and vomiting)    Premature ovarian failure 2002   Psychogenic air hunger     Past Surgical History:  Procedure Laterality Date   CESAREAN SECTION N/A 04/19/2018   Procedure: CESAREAN SECTION;  Surgeon: Sherian Rein, MD;  Location: [facility];  Service: Obstetrics;  Laterality: N/A;   DILATATION & CURETTAGE/HYSTEROSCOPY WITH MYOSURE N/A 05/07/2016   Procedure: DILATATION & CURETTAGE/HYSTEROSCOPY WITH MYOSURE;  Surgeon: Romualdo Bolk, MD;  Location: [facility];  Service: Gynecology;  Laterality: N/A;   DILATION AND CURETTAGE OF UTERUS     radioactive iodine treatment     thyroid   TONSILLECTOMY     TONSILLECTOMY AND ADENOIDECTOMY     WISDOM TOOTH EXTRACTION      Family History  Problem Relation Age of Onset   Hiatal hernia Mother    Atrial fibrillation Father    Hypertension Sister    Polycystic ovary syndrome Sister    Irritable bowel  syndrome Brother    Hiatal hernia Maternal Grandmother    Irritable bowel syndrome Brother     Social History   Socioeconomic History   Marital status: Married    Spouse name: Not on file   Number of children: 1   Years of education: Not on file   Highest education level: Not on file  Occupational History   Occupation: lake Psychologist, forensic: [employer]  Tobacco Use   Smoking status: Former    Packs/day: 0.10    Years: 1.00    Pack years: 0.10    Types: Cigarettes    Quit date: 2015    Years since quitting: 7.4   Smokeless tobacco: Never  Vaping Use   Vaping Use: Never used  Substance and Sexual Activity   Alcohol use: Yes    Alcohol/week: 0.0 - 1.0 standard drinks    Comment: occasional   Drug use: No   Sexual activity: Yes    Partners: Male    Birth control/protection: None    Comment: has premature ovarian failure  Other Topics Concern   Not on file  Social History Narrative   Not on file   Social Determinants of Health   Financial Resource Strain: Not on file  Food Insecurity: Not on file  Transportation Needs: Not on file  Physical Activity: Not on file  Stress: Not on file  Social Connections: Not on file  Intimate Partner Violence: Not on file    Outpatient Medications Prior to Visit  Medication Sig Dispense Refill   acetaminophen (TYLENOL) 325 MG tablet Take 325 mg by mouth every 6 (six) hours as needed for headache.     buPROPion (WELLBUTRIN XL) 300 MG 24 hr tablet TAKE 1 TABLET BY MOUTH EVERY DAY 90 tablet 0   ibuprofen (ADVIL,MOTRIN) 800 MG tablet Take 1 tablet (800 mg total) by mouth every 8 (eight) hours as needed. 45 tablet 1   levothyroxine (SYNTHROID) 175 MCG tablet Take 175 mcg by mouth daily.     metoprolol succinate (TOPROL-XL) 50 MG 24 hr tablet Take 0.5 tablets (25 mg total) by mouth daily. Take with or immediately following a meal. 180 tablet 1   Multiple Vitamins-Minerals (MULTIVITAMIN ADULTS PO) Take 1 tablet by mouth daily.      Probiotic Product (PROBIOTIC-10 PO) Take 1 tablet by mouth daily.     rosuvastatin (CRESTOR) 10 MG tablet Take 1 tablet (10 mg total) by mouth daily. 90 tablet 3   benzonatate (TESSALON) 100 MG capsule Take 1 capsule (100 mg total) by mouth 3 (three) times daily as needed. 30 capsule 0   doxycycline (VIBRA-TABS) 100 MG tablet Take 1 tablet (100 mg total) by mouth 2 (two) times daily. 20 tablet 0   predniSONE (STERAPRED UNI-PAK 21 TAB) 10 MG (21) TBPK tablet 6 day taper; take as directed on package instructions 21 tablet 0   No facility-administered medications prior to visit.    Allergies  Allergen Reactions   Bee Venom Hives   Penicillins Hives    Has patient had a PCN reaction causing immediate rash, facial/tongue/throat swelling, SOB or lightheadedness with hypotension: unknown Has patient had a PCN reaction causing severe rash involving mucus membranes or skin necrosis: unknown Has patient had a PCN reaction that required hospitalization no Has patient had a PCN reaction occurring within the last 10 years: no If all of the above answers are "NO", then may proceed with Cephalosporin use.     ROS Review of Systems  Constitutional:  Negative for chills and fever.  HENT:  Negative for congestion, sinus pressure and sinus pain.   Respiratory:  Positive for cough. Negative for shortness of breath.      Objective:    Physical Exam Vitals reviewed.  Cardiovascular:     Rate and Rhythm: Normal rate and regular rhythm.  Pulmonary:     Effort: Pulmonary effort is normal.     Breath sounds: Normal breath sounds. No wheezing or rales.  Musculoskeletal:     Cervical back: Neck supple.  Lymphadenopathy:     Cervical: No cervical adenopathy.  Neurological:     Mental Status: She is alert.    BP 124/70 (BP Location: Left Arm, Patient Position: Sitting, Cuff Size: Normal)   Pulse 81   Temp 98 F (36.7 C) (Oral)   Wt 297 lb (134.7 kg)   SpO2 97%   BMI 56.12 kg/m  Wt Readings  from Last 3 Encounters:  03/02/21 297 lb (134.7 kg)  08/11/20 285 lb (129.3 kg)  06/30/20 287 lb (130.2 kg)     Health Maintenance Due  Topic Date Due   COVID-19 Vaccine (1) Never done   Hepatitis C Screening  Never done   PAP SMEAR-Modifier  04/12/2019    There are no preventive care reminders to display for this patient.  Lab Results  Component Value Date  TSH <0.010 (L) 10/22/2019   Lab Results  Component Value Date   WBC 7.8 08/11/2020   HGB 13.9 08/11/2020   HCT 40.9 08/11/2020   MCV 89.1 08/11/2020   PLT 225 08/11/2020   Lab Results  Component Value Date   NA 140 08/11/2020   K 3.7 08/11/2020   CO2 27 08/11/2020   GLUCOSE 111 (H) 08/11/2020   BUN 13 08/11/2020   CREATININE 0.80 08/11/2020   BILITOT 0.7 08/11/2020   ALKPHOS 53 08/11/2020   AST 22 08/11/2020   ALT 37 08/11/2020   PROT 6.6 08/11/2020   ALBUMIN 3.7 08/11/2020   CALCIUM 8.6 (L) 08/11/2020   ANIONGAP 9 08/11/2020   GFR 109.18 05/26/2019   Lab Results  Component Value Date   CHOL 164 05/26/2019   Lab Results  Component Value Date   HDL 46.80 05/26/2019   Lab Results  Component Value Date   LDLCALC 98 04/11/2016   Lab Results  Component Value Date   TRIG 203.0 (H) 05/26/2019   Lab Results  Component Value Date   CHOLHDL 3 05/26/2019   No results found for: HGBA1C    Assessment & Plan:   Recent COVID-19 infection.  Patient has some residual upper airway mucus but normal lung exam.  Overall doing well.  No audible wheezing on exam today.  -Reassurance and stay well-hydrated. -Follow-up for any fever, dyspnea, or other concerns.  Suspect her upper airway inflammation will resolve over the next couple weeks.  We did discuss possible repeat dose of prednisone but decided to hold at this time unless symptoms worsen   No orders of the defined types were placed in this encounter.   Follow-up: No follow-ups on file.    [name], MD

## 2021-03-09 DIAGNOSIS — Z713 Dietary counseling and surveillance: Secondary | ICD-10-CM | POA: Diagnosis not present

## 2021-03-16 DIAGNOSIS — E890 Postprocedural hypothyroidism: Secondary | ICD-10-CM | POA: Diagnosis not present

## 2021-03-24 ENCOUNTER — Telehealth: Payer: BC Managed Care – PPO | Admitting: Orthopedic Surgery

## 2021-03-24 DIAGNOSIS — R059 Cough, unspecified: Secondary | ICD-10-CM

## 2021-03-25 MED ORDER — BENZONATATE 100 MG PO CAPS: 100.0000 mg | ORAL_CAPSULE | Freq: Three times a day (TID) | ORAL | 0 refills | Status: DC | PRN

## 2021-03-25 MED ORDER — PREDNISONE 20 MG PO TABS: 40.0000 mg | ORAL_TABLET | Freq: Every day | ORAL | 0 refills | Status: AC

## 2021-03-25 NOTE — Progress Notes (Signed)
We are sorry that you are not feeling well.  Here is how we plan to help!  Based on your presentation I believe you most likely have an exacerbation of your asthma.   In addition you may use A prescription cough medication called Tessalon Perles 100mg . You may take 1-2 capsules every 8 hours as needed for your cough.  And Prednisone 40mg  daily for 5 days.  From your responses in the eVisit questionnaire you describe inflammation in the upper respiratory tract which is causing a significant cough.   USE OF BRONCHODILATOR ("RESCUE") INHALERS: There is a risk from using your bronchodilator too frequently.  The risk is that over-reliance on a medication which only relaxes the muscles surrounding the breathing tubes can reduce the effectiveness of medications prescribed to reduce swelling and congestion of the tubes themselves.  Although you feel brief relief from the bronchodilator inhaler, your asthma may actually be worsening with the tubes becoming more swollen and filled with mucus.  This can delay other crucial treatments, such as oral steroid medications. If you need to use a bronchodilator inhaler daily, several times per day, you should discuss this with your provider.  There are probably better treatments that could be used to keep your asthma under control.     HOME CARE Only take medications as instructed by your medical team. Complete the entire course of an antibiotic. Drink plenty of fluids and get plenty of rest. Avoid close contacts especially the very young and the elderly Cover your mouth if you cough or cough into your sleeve. Always remember to wash your hands A steam or ultrasonic humidifier can help congestion.   GET HELP RIGHT AWAY IF: You develop worsening fever. You become short of breath You cough up blood. Your symptoms persist after you have completed your treatment plan MAKE SURE YOU  Understand these instructions. Will watch your condition. Will get help right  away if you are not doing well or get worse.    Thank you for choosing an e-visit.  Your e-visit answers were reviewed by a board certified advanced clinical practitioner to complete your personal care plan. Depending upon the condition, your plan could have included both over the counter or prescription medications.  Please review your pharmacy choice. Make sure the pharmacy is open so you can pick up prescription now. If there is a problem, you may contact your provider through and have the prescription routed to another pharmacy.  Your safety is important to . If you have drug allergies check your prescription carefully.   For the next 24 hours you can use MyChart to ask questions about today's visit, request a non-urgent call back, or ask for a work or school excuse. You will get an email in the next two days asking about your experience. I hope that your e-visit has been valuable and will speed your recovery.  Greater than 5 minutes, yet less than 10 minutes of time have been spent researching, coordinating and implementing care for this patient today.

## 2021-03-25 NOTE — Addendum Note (Signed)
Addended by: Charma Igo on: 03/25/2021 07:58 AM   Modules accepted: Orders

## 2021-03-27 DIAGNOSIS — T162XXA Foreign body in left ear, initial encounter: Secondary | ICD-10-CM | POA: Diagnosis not present

## 2021-03-29 ENCOUNTER — Telehealth: Payer: BC Managed Care – PPO | Admitting: Physician Assistant

## 2021-03-29 DIAGNOSIS — R059 Cough, unspecified: Secondary | ICD-10-CM

## 2021-03-30 NOTE — Progress Notes (Signed)
Based on what you shared with me, I feel your condition warrants further evaluation and I recommend that you be seen in a face to face visit.   I feel that since you are on oral steroids without relief, you need to be seen in person for an evaluation of lung sounds and most likely a chest xray at this point to make sure something is not being missed.   NOTE: There will be NO CHARGE for this eVisit   If you are having a true medical emergency please call 911.      For an urgent face to face visit, [place] has six urgent care centers for your convenience:     [facility] at [facility] Directions [phone] [address] [place], [place] [address]    [facility] Health Urgent Care Center [facility]) Get Driving Directions [phone] [address] [place], [place] [address]  [facility] Health Urgent Care Center [facility]) Get Driving Directions [phone] [address] [place],  [place]  [address]  [facility] Health Urgent Care at [facility] Get Driving Directions [phone] [address] [place], [place] [address]   [facility] Health Urgent Care at [facility] Get Driving Directions  [phone] [address] [place], [place] [address]   [facility] Health Urgent Care at [facility] Directions [phone] [address] [place], [place] [address]  Your MyChart E-visit questionnaire answers were reviewed by a board certified advanced clinical practitioner to complete your personal care plan based on your specific symptoms.  Thank you for using e-Visits.   I provided 5 minutes of non face-to-face time during this encounter for chart review and documentation.

## 2021-04-02 ENCOUNTER — Ambulatory Visit: Payer: BC Managed Care – PPO | Admitting: Family Medicine

## 2021-04-25 ENCOUNTER — Telehealth: Payer: BC Managed Care – PPO | Admitting: Physician Assistant

## 2021-04-25 DIAGNOSIS — H9202 Otalgia, left ear: Secondary | ICD-10-CM

## 2021-04-25 MED ORDER — NEOMYCIN-POLYMYXIN-HC 3.5-10000-1 OT SOLN: 3.0000 [drp] | Freq: Four times a day (QID) | OTIC | 0 refills | Status: DC

## 2021-04-25 NOTE — Progress Notes (Signed)
E Visit for Swimmer's Ear  We are sorry that you are not feeling well. Here is how we plan to help!  Based on what you have shared with me it looks like you have swimmers ear. Swimmer's ear is a redness or swelling, irritation, or infection of your outer ear canal.  These symptoms usually occur within a few days of swimming.  Your ear canal is a tube that goes from the opening of the ear to the eardrum.  When water stays in your ear canal, germs can grow.  This is a painful condition that often happens to children and swimmers of all ages.  It is not contagious and oral antibiotics are not required to treat uncomplicated swimmer's ear.  The usual symptoms include: Itching inside the ear, Redness or a sense of swelling in the ear, Pain when the ear is tugged on when pressure is placed on the ear, Pus draining from the infected ear. and I have prescribed: Neomycin 0.35%, polymyxin B 10,000 units/mL, and hydrocortisone 0,5% otic solution 4 drops in affected ears four times a day for 7 days   In certain cases swimmer's ear may progress to a more serious bacterial infection of the middle or inner ear.  If you have a fever 102 and up and significantly worsening symptoms, this could indicate a more serious infection moving to the middle/inner and needs face to face evaluation in an office by a provider.  Your symptoms should improve over the next 3 days and should resolve in about 7 days.  HOME CARE:  Wash your hands frequently. Do not place the tip of the bottle on your ear or touch it with your fingers. You can take Acetominophen 650 mg every 4-6 hours as needed for pain.  If pain is severe or moderate, you can apply a heating pad (set on low) or hot water bottle (wrapped in a towel) to outer ear for 20 minutes.  This will also increase drainage. Avoid ear plugs Do not use Q-tips After showers, help the water run out by tilting your head to one side.  GET HELP RIGHT AWAY IF:  Fever is over 102.2  degrees. You develop progressive ear pain or hearing loss. Ear symptoms persist longer than 3 days after treatment.  MAKE SURE YOU:  Understand these instructions. Will watch your condition. Will get help right away if you are not doing well or get worse.  TO PREVENT SWIMMER'S EAR: Use a bathing cap or custom fitted swim molds to keep your ears dry. Towel off after swimming to dry your ears. Tilt your head or pull your earlobes to allow the water to escape your ear canal. If there is still water in your ears, consider using a hairdryer on the lowest setting.   Thank you for choosing an e-visit.  Your e-visit answers were reviewed by a board certified advanced clinical practitioner to complete your personal care plan. Depending upon the condition, your plan could have included both over the counter or prescription medications.  Please review your pharmacy choice. Make sure the pharmacy is open so you can pick up prescription now. If there is a problem, you may contact your provider through MyChart messaging and have the prescription routed to another pharmacy.  Your safety is important to us. If you have drug allergies check your prescription carefully.   For the next 24 hours you can use MyChart to ask questions about today's visit, request a non-urgent call back, or ask for a work   or school excuse. You will get an email in the next two days asking about your experience. I hope that your e-visit has been valuable and will speed your recovery.   I provided 6 minutes of non face-to-face time during this encounter for chart review and documentation.   

## 2021-04-26 DIAGNOSIS — H60392 Other infective otitis externa, left ear: Secondary | ICD-10-CM | POA: Diagnosis not present

## 2021-05-04 ENCOUNTER — Ambulatory Visit: Payer: BC Managed Care – PPO | Admitting: Family Medicine

## 2021-05-04 ENCOUNTER — Other Ambulatory Visit: Payer: Self-pay

## 2021-05-04 VITALS — BP 130/70 | HR 85 | Temp 98.2°F | Wt 294.2 lb

## 2021-05-04 DIAGNOSIS — H60332 Swimmer's ear, left ear: Secondary | ICD-10-CM | POA: Diagnosis not present

## 2021-05-04 DIAGNOSIS — M25511 Pain in right shoulder: Secondary | ICD-10-CM | POA: Diagnosis not present

## 2021-05-04 MED ORDER — CIPROFLOXACIN-DEXAMETHASONE 0.3-0.1 % OT SUSP: 4.0000 [drp] | Freq: Two times a day (BID) | OTIC | 0 refills | Status: DC

## 2021-05-04 NOTE — Progress Notes (Signed)
Established Patient Office Visit  Subjective:  Patient ID: Jessica Silva, female    DOB: [birth year]  Age: 35 y.o. MRN: [id number]  CC:  Chief Complaint  Patient presents with   Ear Fullness    Left ear, seen at urgent care, DX swimmers ear, given antibiotic and drops, hearing is very muffled    HPI Jessica Silva presents for the following concerns  Left ear pain for few weeks.  She and her family belong to a pool and she has been swimming quite a bit this summer.  She developed severe left ear pain and had virtual visit on the 17th and was prescribed Cortisporin otic drops.  Her pain worsened and the next day she went to a [facility] urgent care on the 18th and was prescribed Septra DS and is finishing that course now.  She still has significant pain though slightly improved.  No drainage.  She states she had swimmer's ear several times during childhood  Other issue is right shoulder pain.  She states she has had pain for months and worse with abduction.  Only minimal pain with internal rotation.  She is not sure of specific injury but wonders if she may have injured her shoulder with pushing her daughter in a stroller.  Pain if anything is become more progressive.  No definite weakness.  No prior shoulder difficulties.  Past Medical History:  Diagnosis Date   Abnormal uterine bleeding    Amenorrhea    Anxiety    Depression    GERD (gastroesophageal reflux disease)    Hashimoto's thyroiditis    Hormone disorder    Infertility, female    PONV (postoperative nausea and vomiting)    Premature ovarian failure 2002   Psychogenic air hunger     Past Surgical History:  Procedure Laterality Date   CESAREAN SECTION N/A 04/19/2018   Procedure: CESAREAN SECTION;  Surgeon: Sherian Rein, MD;  Location: [facility];  Service: Obstetrics;  Laterality: N/A;   DILATATION & CURETTAGE/HYSTEROSCOPY WITH MYOSURE N/A 05/07/2016   Procedure: DILATATION & CURETTAGE/HYSTEROSCOPY WITH  MYOSURE;  Surgeon: Romualdo Bolk, MD;  Location: [facility];  Service: Gynecology;  Laterality: N/A;   DILATION AND CURETTAGE OF UTERUS     radioactive iodine treatment     thyroid   TONSILLECTOMY     TONSILLECTOMY AND ADENOIDECTOMY     WISDOM TOOTH EXTRACTION      Family History  Problem Relation Age of Onset   Hiatal hernia Mother    Atrial fibrillation Father    Hypertension Sister    Polycystic ovary syndrome Sister    Irritable bowel syndrome Brother    Hiatal hernia Maternal Grandmother    Irritable bowel syndrome Brother     Social History   Socioeconomic History   Marital status: Married    Spouse name: Not on file   Number of children: 1   Years of education: Not on file   Highest education level: Not on file  Occupational History   Occupation: lake Psychologist, forensic: [employer]  Tobacco Use   Smoking status: Former    Packs/day: 0.10    Years: 1.00    Pack years: 0.10    Types: Cigarettes    Quit date: 2015    Years since quitting: 7.6   Smokeless tobacco: Never  Vaping Use   Vaping Use: Never used  Substance and Sexual Activity   Alcohol use: Yes    Alcohol/week: 0.0 - 1.0 standard  drinks    Comment: occasional   Drug use: No   Sexual activity: Yes    Partners: Male    Birth control/protection: None    Comment: has premature ovarian failure  Other Topics Concern   Not on file  Social History Narrative   Not on file   Social Determinants of Health   Financial Resource Strain: Not on file  Food Insecurity: Not on file  Transportation Needs: Not on file  Physical Activity: Not on file  Stress: Not on file  Social Connections: Not on file  Intimate Partner Violence: Not on file    Outpatient Medications Prior to Visit  Medication Sig Dispense Refill   acetaminophen (TYLENOL) 325 MG tablet Take 325 mg by mouth every 6 (six) hours as needed for headache.     benzonatate (TESSALON) 100 MG capsule Take 1 capsule (100 mg total) by mouth 3  (three) times daily as needed for cough. 20 capsule 0   buPROPion (WELLBUTRIN XL) 300 MG 24 hr tablet TAKE 1 TABLET BY MOUTH EVERY DAY 90 tablet 0   ibuprofen (ADVIL,MOTRIN) 800 MG tablet Take 1 tablet (800 mg total) by mouth every 8 (eight) hours as needed. 45 tablet 1   levothyroxine (SYNTHROID) 175 MCG tablet Take 175 mcg by mouth daily.     metoprolol succinate (TOPROL-XL) 50 MG 24 hr tablet Take 0.5 tablets (25 mg total) by mouth daily. Take with or immediately following a meal. 180 tablet 1   Multiple Vitamins-Minerals (MULTIVITAMIN ADULTS PO) Take 1 tablet by mouth daily.     neomycin-polymyxin-hydrocortisone (CORTISPORIN) OTIC solution Place 3 drops into the left ear 4 (four) times daily. 10 mL 0   Probiotic Product (PROBIOTIC-10 PO) Take 1 tablet by mouth daily.     rosuvastatin (CRESTOR) 10 MG tablet Take 1 tablet (10 mg total) by mouth daily. 90 tablet 3   No facility-administered medications prior to visit.    Allergies  Allergen Reactions   Bee Venom Hives   Penicillins Hives    Has patient had a PCN reaction causing immediate rash, facial/tongue/throat swelling, SOB or lightheadedness with hypotension: unknown Has patient had a PCN reaction causing severe rash involving mucus membranes or skin necrosis: unknown Has patient had a PCN reaction that required hospitalization no Has patient had a PCN reaction occurring within the last 10 years: no If all of the above answers are "NO", then may proceed with Cephalosporin use.     ROS Review of Systems  Constitutional:  Negative for chills and fever.  HENT:  Positive for ear pain. Negative for congestion and ear discharge.   Respiratory:  Negative for shortness of breath.   Musculoskeletal:        See HPI     Objective:    Physical Exam Vitals reviewed.  HENT:     Ears:     Comments: Right ear canal is normal.  Right eardrum is normal.  Left ear canal is very erythematous.  Moderate swelling.  She does have some debris  which looks like cerumen in the canal.  Eardrum not fully visualized. Cardiovascular:     Rate and Rhythm: Normal rate and regular rhythm.  Pulmonary:     Effort: Pulmonary effort is normal.     Breath sounds: Normal breath sounds.  Musculoskeletal:     Comments: Right shoulder reveals some mild acromioclavicular tenderness.  She does have some pain with abduction against resistance.  No significant pain with internal rotation.  Neurological:  Mental Status: She is alert.    BP 130/70 (BP Location: Left Arm, Patient Position: Sitting, Cuff Size: Large)   Pulse 85   Temp 98.2 F (36.8 C) (Oral)   Wt 294 lb 3.2 oz (133.4 kg)   SpO2 95%   BMI 55.59 kg/m  Wt Readings from Last 3 Encounters:  05/04/21 294 lb 3.2 oz (133.4 kg)  03/02/21 297 lb (134.7 kg)  08/11/20 285 lb (129.3 kg)     Health Maintenance Due  Topic Date Due   COVID-19 Vaccine (1) Never done   Hepatitis C Screening  Never done   PAP SMEAR-Modifier  04/12/2019   INFLUENZA VACCINE  04/09/2021    There are no preventive care reminders to display for this patient.  Lab Results  Component Value Date   TSH <0.010 (L) 10/22/2019   Lab Results  Component Value Date   WBC 7.8 08/11/2020   HGB 13.9 08/11/2020   HCT 40.9 08/11/2020   MCV 89.1 08/11/2020   PLT 225 08/11/2020   Lab Results  Component Value Date   NA 140 08/11/2020   K 3.7 08/11/2020   CO2 27 08/11/2020   GLUCOSE 111 (H) 08/11/2020   BUN 13 08/11/2020   CREATININE 0.80 08/11/2020   BILITOT 0.7 08/11/2020   ALKPHOS 53 08/11/2020   AST 22 08/11/2020   ALT 37 08/11/2020   PROT 6.6 08/11/2020   ALBUMIN 3.7 08/11/2020   CALCIUM 8.6 (L) 08/11/2020   ANIONGAP 9 08/11/2020   GFR 109.18 05/26/2019   Lab Results  Component Value Date   CHOL 164 05/26/2019   Lab Results  Component Value Date   HDL 46.80 05/26/2019   Lab Results  Component Value Date   LDLCALC 98 04/11/2016   Lab Results  Component Value Date   TRIG 203.0 (H)  05/26/2019   Lab Results  Component Value Date   CHOLHDL 3 05/26/2019   No results found for: HGBA1C    Assessment & Plan:   #1 left otitis externa.  She has some debris which looks like cerumen in the canal.  We recommended using suction to clear out the canal as much as possible and patient consented.  We were able to remove little bit of the cerumen that was visible previously -We will switch to Ciprodex otic 4 drops left ear twice daily and keep ear dry as possible -Touch base if you are not improving over the next week  #2 chronic intermittent right shoulder pain.  She had months of shoulder pain and somewhat progressive -Set up sports medicine referral for further evaluation   Meds ordered this encounter  Medications   ciprofloxacin-dexamethasone (CIPRODEX) OTIC suspension    Sig: Place 4 drops into the left ear 2 (two) times daily.    Dispense:  7.5 mL    Refill:  0    Follow-up: No follow-ups on file.    [name], MD

## 2021-05-10 NOTE — Progress Notes (Signed)
Subjective:    I'm seeing this patient as a consultation for:  Dr. Caryl Never. Note will be routed back to referring provider/PCP.  CC: R shoulder pain  I, Molly Weber, LAT, ATC, am serving as scribe for Dr. Clementeen Graham.  HPI: Pt is a 35 y/o female presenting w/ c/o R shoulder pain x months that may have occurred as a result of pushing her daughter on the swings.  She locates her pain to her R ACJ and ant shoulder.   Radiating pain: No R shoulder mechanical symptoms: No Aggravating factors: R shoulder flexion and horiz aDd AROM; R shoulder functional IR and ER Treatments tried: ice; heat; massage; IBU  Past medical history, Surgical history, Family history, Social history, Allergies, and medications have been entered into the medical record, reviewed. Obesity.  History premature ovarian failure.  Concern low bone mineral density.   Review of Systems: No fevers or chills  Objective:    Vitals:   05/11/21 0924  BP: 120/80  Pulse: 84  SpO2: 97%   General: Well Developed, well nourished, and in no acute distress.   MSK: Right shoulder normal-appearing Tender palpation AC joint Normal motion pain with abduction and internal rotation. Strength 4+/5 abduction intact otherwise. Positive Hawkins and Neer's test. Positive crossover arm compression test. Negative empty can test. Mildly positive Yergason's and speeds test. Pulses capillary fill and sensation are intact distally.   Lab and Radiology Results  Diagnostic Limited MSK Ultrasound of: Right shoulder.  Of note ultrasound accuracy limited by body habitus. Biceps tendon intact and no significant biceps tenosynovitis visible. Subscapularis tendon is intact. Supraspinatus tendon is intact. Moderate subacromial bursitis present. Infraspinatus tendon is intact.  AC joint minimal effusion. Impression: Right shoulder pain thought to be due to subacromial bursitis.  X-ray images right shoulder obtained today personally and  independently interpreted No acute fracture.  No severe DJD. Await formal radiology review   Impression and Recommendations:    Assessment and Plan: 35 y.o. female with right shoulder pain thought to be due to subacromial bursitis.  Plan for physical therapy referral.  Recheck in 6 weeks.  If not better consider injection or MRI.  Check bone marrow density given history of premature ovarian.  Marland Kitchen  PDMP not reviewed this encounter. Orders Placed This Encounter  Procedures   [place] LIMITED JOINT SPACE STRUCTURES UP RIGHT(NO LINKED CHARGES)    Order Specific Question:   Reason for Exam (SYMPTOM  OR DIAGNOSIS REQUIRED)    Answer:   R shoulder pain    Order Specific Question:   Preferred imaging location?    Answer:   Adult nurse [facility] BONE DENSITY (DXA)    Standing Status:   Future    Standing Expiration Date:   05/11/2022    Order Specific Question:   Reason for Exam (SYMPTOM  OR DIAGNOSIS REQUIRED)    Answer:   eval bone densiety premature ovarian failure    Order Specific Question:   Is the patient pregnant?    Answer:   No    Order Specific Question:   Preferred imaging location?    Answer:   [facility]   DG Shoulder Right    Standing Status:   Future    Number of Occurrences:   1    Standing Expiration Date:   05/11/2022    Order Specific Question:   Reason for Exam (SYMPTOM  OR DIAGNOSIS REQUIRED)    Answer:   eval right shoulder pain  Order Specific Question:   Is patient pregnant?    Answer:   No    Order Specific Question:   Preferred imaging location?    Answer:   [facility]   Ambulatory referral to Physical Therapy    Referral Priority:   Routine    Referral Type:   Physical Medicine    Referral Reason:   Specialty Services Required    Requested Specialty:   Physical Therapy    Number of Visits Requested:   1   No orders of the defined types were placed in this encounter.   Discussed warning signs or symptoms. Please see  discharge instructions. Patient expresses understanding.   The above documentation has been reviewed and is accurate and complete Clementeen Graham, M.D.

## 2021-05-11 ENCOUNTER — Ambulatory Visit (INDEPENDENT_AMBULATORY_CARE_PROVIDER_SITE_OTHER): Payer: BC Managed Care – PPO

## 2021-05-11 ENCOUNTER — Ambulatory Visit: Payer: Self-pay

## 2021-05-11 ENCOUNTER — Ambulatory Visit: Payer: BC Managed Care – PPO | Admitting: Family Medicine

## 2021-05-11 ENCOUNTER — Other Ambulatory Visit: Payer: Self-pay

## 2021-05-11 ENCOUNTER — Encounter: Payer: Self-pay | Admitting: Family Medicine

## 2021-05-11 VITALS — BP 120/80 | HR 84 | Ht 61.0 in | Wt 295.0 lb

## 2021-05-11 DIAGNOSIS — E2839 Other primary ovarian failure: Secondary | ICD-10-CM

## 2021-05-11 DIAGNOSIS — G8929 Other chronic pain: Secondary | ICD-10-CM

## 2021-05-11 DIAGNOSIS — M25511 Pain in right shoulder: Secondary | ICD-10-CM | POA: Diagnosis not present

## 2021-05-11 NOTE — Patient Instructions (Signed)
Thank you for coming in today.   I've referred you to Physical Therapy.  Let us know if you don't hear from them in one week.   Please get an Xray today before you leave   You should hear about that bone density test.   Recheck in about 6 weeks.   Please use Voltaren gel (Generic Diclofenac Gel) up to 4x daily for pain as needed.  This is available over-the-counter as both the name brand Voltaren gel and the generic diclofenac gel.   Let me know if you have a problem or if this is not working.

## 2021-05-15 NOTE — Progress Notes (Signed)
Right shoulder x-ray shows mild arthritis changes of the small joint at the top of the shoulder (AC joint).  Otherwise the x-ray looks okay

## 2021-05-18 ENCOUNTER — Encounter: Payer: Self-pay | Admitting: Family Medicine

## 2021-05-19 ENCOUNTER — Telehealth: Payer: BC Managed Care – PPO | Admitting: Nurse Practitioner

## 2021-05-19 DIAGNOSIS — R519 Headache, unspecified: Secondary | ICD-10-CM

## 2021-05-19 DIAGNOSIS — L509 Urticaria, unspecified: Secondary | ICD-10-CM | POA: Diagnosis not present

## 2021-05-19 MED ORDER — PREDNISONE 20 MG PO TABS: 40.0000 mg | ORAL_TABLET | Freq: Every day | ORAL | 0 refills | Status: AC

## 2021-05-19 NOTE — Progress Notes (Signed)
Virtual Visit Consent   [name], you are scheduled for a virtual visit with Mary-Margaret Daphine Deutscher, FNP, a [facility] provider, today.     Just as with appointments in the office, your consent must be obtained to participate.  Your consent will be active for this visit and any virtual visit you may have with one of our providers in the next 365 days.     If you have a MyChart account, a copy of this consent can be sent to you electronically.  All virtual visits are billed to your insurance company just like a traditional visit in the office.    As this is a virtual visit, video technology does not allow for your provider to perform a traditional examination.  This may limit your provider's ability to fully assess your condition.  If your provider identifies any concerns that need to be evaluated in person or the need to arrange testing (such as labs, EKG, etc.), we will make arrangements to do so.     Although advances in technology are sophisticated, we cannot ensure that it will always work on either your end or our end.  If the connection with a video visit is poor, the visit may have to be switched to a telephone visit.  With either a video or telephone visit, we are not always able to ensure that we have a secure connection.     I need to obtain your verbal consent now.   Are you willing to proceed with your visit today? YES   Jessica Silva has provided verbal consent on 05/19/2021 for a virtual visit (video or telephone).   Mary-Margaret Daphine Deutscher, FNP   Date: 05/19/2021 2:50 PM   Virtual Visit via Video Note   I, Mary-Margaret Daphine Deutscher, [name] ([id number], November 13, 1985) on 05/19/21 at  2:45 PM EDT by a video-enabled telemedicine application and verified that I am speaking with the correct person using two identifiers.  Location: Patient: Virtual Visit Location Patient: Home Provider: Virtual Visit Location Provider: Mobile   I discussed the limitations of  evaluation and management by telemedicine and the availability of in person appointments. The patient expressed understanding and agreed to proceed.    History of Present Illness: Jessica Silva is a 35 y.o. who identifies as a female who was assigned female at birth, and is being seen today for headache and hives.  HPI: Patient states she developed a headache this afternoon. She took ibuprofen which has helped a little. She has right shoulder pain and sometimes she gets headaches with it. She rubbed voltaren on shoulder. She also has migraines and this feels like her migraines but she has a low grade fever. She has not done a covid test since headache. She also has developed hives on legs that she just noticed 20 minutes ago.     Review of Systems  Constitutional:  Positive for fever (low grade 99.4). Negative for chills.  HENT:  Negative for congestion and sore throat.   Respiratory:  Positive for cough and sputum production.   Neurological:  Negative for headaches.   Problems:  Patient Active Problem List   Diagnosis Date Noted   Palpitations 06/30/2020   Tachycardia 06/24/2020   S/P cesarean section 04/19/2018   Pregnant 04/17/2018   Thyroid disease during pregnancy in second trimester    [redacted] weeks gestation of pregnancy    Obesity complicating pregnancy in second trimester    Encounter for screening for coronary artery  disease    Premature ovarian failure 05/20/2016   Depression, recurrent (HCC) 07/17/2015   Weight gain 07/14/2013    Allergies:  Allergies  Allergen Reactions   Bee Venom Hives   Penicillins Hives    Has patient had a PCN reaction causing immediate rash, facial/tongue/throat swelling, SOB or lightheadedness with hypotension: unknown Has patient had a PCN reaction causing severe rash involving mucus membranes or skin necrosis: unknown Has patient had a PCN reaction that required hospitalization no Has patient had a PCN reaction occurring within the last 10  years: no If all of the above answers are "NO", then may proceed with Cephalosporin use.    Medications:  Current Outpatient Medications:    acetaminophen (TYLENOL) 325 MG tablet, Take 325 mg by mouth every 6 (six) hours as needed for headache., Disp: , Rfl:    buPROPion (WELLBUTRIN XL) 300 MG 24 hr tablet, TAKE 1 TABLET BY MOUTH EVERY DAY, Disp: 90 tablet, Rfl: 0   ciprofloxacin-dexamethasone (CIPRODEX) OTIC suspension, Place 4 drops into the left ear 2 (two) times daily., Disp: 7.5 mL, Rfl: 0   ibuprofen (ADVIL,MOTRIN) 800 MG tablet, Take 1 tablet (800 mg total) by mouth every 8 (eight) hours as needed., Disp: 45 tablet, Rfl: 1   levothyroxine (SYNTHROID) 175 MCG tablet, Take 175 mcg by mouth daily., Disp: , Rfl:    metoprolol succinate (TOPROL-XL) 50 MG 24 hr tablet, Take 0.5 tablets (25 mg total) by mouth daily. Take with or immediately following a meal., Disp: 180 tablet, Rfl: 1   Multiple Vitamins-Minerals (MULTIVITAMIN ADULTS PO), Take 1 tablet by mouth daily., Disp: , Rfl:    neomycin-polymyxin-hydrocortisone (CORTISPORIN) OTIC solution, Place 3 drops into the left ear 4 (four) times daily., Disp: 10 mL, Rfl: 0   Probiotic Product (PROBIOTIC-10 PO), Take 1 tablet by mouth daily., Disp: , Rfl:    rosuvastatin (CRESTOR) 10 MG tablet, Take 1 tablet (10 mg total) by mouth daily., Disp: 90 tablet, Rfl: 3  Observations/Objective: Patient is well-developed, well-nourished in no acute distress.  Resting comfortably  at home.  Head is normocephalic, atraumatic.  No labored breathing.  Speech is clear and coherent with logical content.  Patient is alert and oriented at baseline.  Welps on bil lower legs  Assessment and Plan:  [name] in today with chief complaint of No chief complaint on file.   1. Acute nonintractable headache, unspecified headache type Rest Ice if helps Lay in dark room  2. Urticaria Avoid scratching Benadryl OTC for itching - predniSONE (DELTASONE) 20  MG tablet; Take 2 tablets (40 mg total) by mouth daily with breakfast for 5 days. 2 po daily for 5 days  Dispense: 10 tablet; Refill: 0    Follow Up Instructions: I discussed the assessment and treatment plan with the patient. The patient was provided an opportunity to ask questions and all were answered. The patient agreed with the plan and demonstrated an understanding of the instructions.  A copy of instructions were sent to the patient via MyChart.  The patient was advised to call back or seek an in-person evaluation if the symptoms worsen or if the condition fails to improve as anticipated.  Time:  I spent 12 minutes with the patient via telehealth technology discussing the above problems/concerns.    Mary-Margaret Daphine Deutscher, FNP

## 2021-05-22 ENCOUNTER — Ambulatory Visit: Payer: BC Managed Care – PPO | Admitting: Family Medicine

## 2021-05-22 ENCOUNTER — Other Ambulatory Visit: Payer: Self-pay

## 2021-05-22 VITALS — BP 140/80 | HR 100 | Temp 99.1°F | Wt 288.2 lb

## 2021-05-22 DIAGNOSIS — R21 Rash and other nonspecific skin eruption: Secondary | ICD-10-CM

## 2021-05-22 DIAGNOSIS — H6592 Unspecified nonsuppurative otitis media, left ear: Secondary | ICD-10-CM

## 2021-05-22 NOTE — Patient Instructions (Signed)
Be in touch for any recurrent fever or rash

## 2021-05-22 NOTE — Progress Notes (Signed)
Established Patient Office Visit  Subjective:  Patient ID: Jessica Silva, female    DOB: [birth year]  Age: 35 y.o. MRN: [id number]  CC:  Chief Complaint  Patient presents with   Follow-up    Ear pain has not improved, over the weekend legs broke out in hives, have started to go away now    HPI [name] presents for the following issues.  She had had some recent ear pain and concern for otitis externa.  She did have a lot of debris in the canal when she was here recently we were able to suction some of that out.  She completed course with Ciprodex.  She has sensation of muffled feeling now but no real true ear pain.  No drainage.  No dizziness.  She has changes symptoms with change of position at times.  Patient relates recent slightly pruritic erythematous blanching rash on her lower legs.  She had a virtual visit on Saturday and was prescribed Benadryl and prednisone.  She states she stayed indoors all weekend.  Did not have any generalized rash.  She also had noted on Saturday some diffuse intermittent headache.  No cough.  No recent tick bites.  She does relate some possible low-grade fevers recently.  Temperature past week is fluctuated between 99.5 and 100.5 but none today.  No chills.  No dysuria.  No cough.  No arthralgias.  She has had some slight increased heart rate since starting the prednisone we [place] that is probably a prednisone effect.  Today she took half her prescribed dose which was prescribed at 40 mg daily for 5 days and she is currently taking 20 mg daily  Past Medical History:  Diagnosis Date   Abnormal uterine bleeding    Amenorrhea    Anxiety    Depression    GERD (gastroesophageal reflux disease)    Hashimoto's thyroiditis    Hormone disorder    Infertility, female    PONV (postoperative nausea and vomiting)    Premature ovarian failure 2002   Psychogenic air hunger     Past Surgical History:  Procedure Laterality Date   CESAREAN SECTION N/A  04/19/2018   Procedure: CESAREAN SECTION;  Surgeon: Sherian Rein, MD;  Location: [facility];  Service: Obstetrics;  Laterality: N/A;   DILATATION & CURETTAGE/HYSTEROSCOPY WITH MYOSURE N/A 05/07/2016   Procedure: DILATATION & CURETTAGE/HYSTEROSCOPY WITH MYOSURE;  Surgeon: Romualdo Bolk, MD;  Location: [facility];  Service: Gynecology;  Laterality: N/A;   DILATION AND CURETTAGE OF UTERUS     radioactive iodine treatment     thyroid   TONSILLECTOMY     TONSILLECTOMY AND ADENOIDECTOMY     WISDOM TOOTH EXTRACTION      Family History  Problem Relation Age of Onset   Hiatal hernia Mother    Atrial fibrillation Father    Hypertension Sister    Polycystic ovary syndrome Sister    Irritable bowel syndrome Brother    Hiatal hernia Maternal Grandmother    Irritable bowel syndrome Brother     Social History   Socioeconomic History   Marital status: Married    Spouse name: Not on file   Number of children: 1   Years of education: Not on file   Highest education level: Not on file  Occupational History   Occupation: [employer]    Employer: [employer]  Tobacco Use   Smoking status: Former    Packs/day: 0.10    Years: 1.00    Pack  years: 0.10    Types: Cigarettes    Quit date: 2015    Years since quitting: 7.7   Smokeless tobacco: Never  Vaping Use   Vaping Use: Never used  Substance and Sexual Activity   Alcohol use: Yes    Alcohol/week: 0.0 - 1.0 standard drinks    Comment: occasional   Drug use: No   Sexual activity: Yes    Partners: Male    Birth control/protection: None    Comment: has premature ovarian failure  Other Topics Concern   Not on file  Social History Narrative   Not on file   Social Determinants of Health   Financial Resource Strain: Not on file  Food Insecurity: Not on file  Transportation Needs: Not on file  Physical Activity: Not on file  Stress: Not on file  Social Connections: Not on file  Intimate Partner Violence: Not on  file    Outpatient Medications Prior to Visit  Medication Sig Dispense Refill   acetaminophen (TYLENOL) 325 MG tablet Take 325 mg by mouth every 6 (six) hours as needed for headache.     buPROPion (WELLBUTRIN XL) 300 MG 24 hr tablet TAKE 1 TABLET BY MOUTH EVERY DAY 90 tablet 0   ciprofloxacin-dexamethasone (CIPRODEX) OTIC suspension Place 4 drops into the left ear 2 (two) times daily. 7.5 mL 0   ibuprofen (ADVIL,MOTRIN) 800 MG tablet Take 1 tablet (800 mg total) by mouth every 8 (eight) hours as needed. 45 tablet 1   levothyroxine (SYNTHROID) 175 MCG tablet Take 175 mcg by mouth daily.     metoprolol succinate (TOPROL-XL) 50 MG 24 hr tablet Take 0.5 tablets (25 mg total) by mouth daily. Take with or immediately following a meal. 180 tablet 1   Multiple Vitamins-Minerals (MULTIVITAMIN ADULTS PO) Take 1 tablet by mouth daily.     neomycin-polymyxin-hydrocortisone (CORTISPORIN) OTIC solution Place 3 drops into the left ear 4 (four) times daily. 10 mL 0   predniSONE (DELTASONE) 20 MG tablet Take 2 tablets (40 mg total) by mouth daily with breakfast for 5 days. 2 po daily for 5 days 10 tablet 0   Probiotic Product (PROBIOTIC-10 PO) Take 1 tablet by mouth daily.     rosuvastatin (CRESTOR) 10 MG tablet Take 1 tablet (10 mg total) by mouth daily. 90 tablet 3   No facility-administered medications prior to visit.    Allergies  Allergen Reactions   Bee Venom Hives   Penicillins Hives    Has patient had a PCN reaction causing immediate rash, facial/tongue/throat swelling, SOB or lightheadedness with hypotension: unknown Has patient had a PCN reaction causing severe rash involving mucus membranes or skin necrosis: unknown Has patient had a PCN reaction that required hospitalization no Has patient had a PCN reaction occurring within the last 10 years: no If all of the above answers are "NO", then may proceed with Cephalosporin use.     ROS Review of Systems  Constitutional:  Negative for chills.   HENT:  Negative for ear discharge and ear pain.   Respiratory:  Negative for cough and shortness of breath.   Cardiovascular:  Negative for chest pain.  Skin:  Positive for rash.  Hematological:  Negative for adenopathy.     Objective:    Physical Exam Vitals reviewed.  Constitutional:      Appearance: Normal appearance.  HENT:     Ears:     Comments: Right ear canal and eardrum are normal.  Left ear canal is is clear and previously  noted inflammatory changes in canal have resolved.  She does have evidence for serous effusion left eardrum.  No perforation.  No erythema. Cardiovascular:     Rate and Rhythm: Normal rate and regular rhythm.  Pulmonary:     Effort: Pulmonary effort is normal.     Breath sounds: Normal breath sounds.  Musculoskeletal:     Cervical back: Neck supple.  Lymphadenopathy:     Cervical: No cervical adenopathy.  Skin:    Comments: Mottled appearing skin lower legs.  She has a couple of what appear to be small bites that are resolving but no pustules.  No vesicles.  No nodules palpated.  No urticaria at this time.  No scaly rash.  No petechiae.  Neurological:     Mental Status: She is alert.    BP 140/80 (BP Location: Left Arm, Patient Position: Sitting, Cuff Size: Normal)   Pulse 100   Temp 99.1 F (37.3 C) (Oral)   Wt 288 lb 3.2 oz (130.7 kg)   LMP 04/27/2021   SpO2 97%   BMI 54.45 kg/m  Wt Readings from Last 3 Encounters:  05/22/21 288 lb 3.2 oz (130.7 kg)  05/11/21 295 lb (133.8 kg)  05/04/21 294 lb 3.2 oz (133.4 kg)     Health Maintenance Due  Topic Date Due   COVID-19 Vaccine (1) Never done   Hepatitis C Screening  Never done   PAP SMEAR-Modifier  04/12/2019   INFLUENZA VACCINE  04/09/2021    There are no preventive care reminders to display for this patient.  Lab Results  Component Value Date   TSH <0.010 (L) 10/22/2019   Lab Results  Component Value Date   WBC 7.8 08/11/2020   HGB 13.9 08/11/2020   HCT 40.9 08/11/2020    MCV 89.1 08/11/2020   PLT 225 08/11/2020   Lab Results  Component Value Date   NA 140 08/11/2020   K 3.7 08/11/2020   CO2 27 08/11/2020   GLUCOSE 111 (H) 08/11/2020   BUN 13 08/11/2020   CREATININE 0.80 08/11/2020   BILITOT 0.7 08/11/2020   ALKPHOS 53 08/11/2020   AST 22 08/11/2020   ALT 37 08/11/2020   PROT 6.6 08/11/2020   ALBUMIN 3.7 08/11/2020   CALCIUM 8.6 (L) 08/11/2020   ANIONGAP 9 08/11/2020   GFR 109.18 05/26/2019   Lab Results  Component Value Date   CHOL 164 05/26/2019   Lab Results  Component Value Date   HDL 46.80 05/26/2019   Lab Results  Component Value Date   LDLCALC 98 04/11/2016   Lab Results  Component Value Date   TRIG 203.0 (H) 05/26/2019   Lab Results  Component Value Date   CHOLHDL 3 05/26/2019   No results found for: HGBA1C    Assessment & Plan:   #1 left otitis media with effusion.  No evidence for suppurative changes.  Previous ear canal changes have improved.  We explained that otitis media with effusion can sometimes take several weeks or even months to resolve.  No indication for medications at this time.  Give this a few weeks.  If not improving after that consider ENT referral  #2 recent pruritic rash lower legs.  This looks from picture almost urticarial but was not raised.  She was placed on prednisone and is improving. -Finish out the prednisone at half dose and be in touch if rash recurs.  #3 question of low-grade fever recently.  None today.  Monitor closely and be in touch for persistent fever.  Follow-up: No follow-ups on file.    Carolann Littler, MD

## 2021-05-24 ENCOUNTER — Encounter: Payer: Self-pay | Admitting: Family Medicine

## 2021-05-24 DIAGNOSIS — R202 Paresthesia of skin: Secondary | ICD-10-CM

## 2021-05-25 ENCOUNTER — Other Ambulatory Visit: Payer: Self-pay | Admitting: Family Medicine

## 2021-05-25 ENCOUNTER — Other Ambulatory Visit (INDEPENDENT_AMBULATORY_CARE_PROVIDER_SITE_OTHER): Payer: BC Managed Care – PPO

## 2021-05-25 ENCOUNTER — Other Ambulatory Visit: Payer: Self-pay

## 2021-05-25 DIAGNOSIS — R202 Paresthesia of skin: Secondary | ICD-10-CM | POA: Diagnosis not present

## 2021-05-25 LAB — COMPREHENSIVE METABOLIC PANEL
ALT: 25 U/L (ref 0–35)
AST: 18 U/L (ref 0–37)
Albumin: 4 g/dL (ref 3.5–5.2)
Alkaline Phosphatase: 42 U/L (ref 39–117)
BUN: 14 mg/dL (ref 6–23)
CO2: 28 mEq/L (ref 19–32)
Calcium: 9.1 mg/dL (ref 8.4–10.5)
Chloride: 102 mEq/L (ref 96–112)
Creatinine, Ser: 1 mg/dL (ref 0.40–1.20)
GFR: 73.52 mL/min (ref >60.00)
Glucose, Bld: 87 mg/dL (ref 70–99)
Potassium: 4 mEq/L (ref 3.5–5.1)
Sodium: 138 mEq/L (ref 135–145)
Total Bilirubin: 0.7 mg/dL (ref 0.2–1.2)
Total Protein: 6.7 g/dL (ref 6.0–8.3)

## 2021-05-25 LAB — CBC WITH DIFFERENTIAL/PLATELET
Basophils Absolute: 0.1 10*3/uL (ref 0.0–0.1)
Basophils Relative: 0.8 % (ref 0.0–3.0)
Eosinophils Absolute: 0.2 10*3/uL (ref 0.0–0.7)
Eosinophils Relative: 3 % (ref 0.0–5.0)
HCT: 41.8 % (ref 36.0–46.0)
Hemoglobin: 14.1 g/dL (ref 12.0–15.0)
Lymphocytes Relative: 32.8 % (ref 12.0–46.0)
Lymphs Abs: 2.4 10*3/uL (ref 0.7–4.0)
MCHC: 33.6 g/dL (ref 30.0–36.0)
MCV: 88.9 fl (ref 78.0–100.0)
Monocytes Absolute: 0.4 10*3/uL (ref 0.1–1.0)
Monocytes Relative: 6 % (ref 3.0–12.0)
Neutro Abs: 4.1 10*3/uL (ref 1.4–7.7)
Neutrophils Relative %: 57.4 % (ref 43.0–77.0)
Platelets: 212 10*3/uL (ref 150.0–400.0)
RBC: 4.7 Mil/uL (ref 3.87–5.11)
RDW: 12 % (ref 11.5–15.5)
WBC: 7.2 10*3/uL (ref 4.0–10.5)

## 2021-05-25 LAB — VITAMIN B12: Vitamin B-12: 461 pg/mL (ref 211–911)

## 2021-05-25 LAB — TSH: TSH: 1.77 u[IU]/mL (ref 0.35–5.50)

## 2021-05-25 LAB — HEMOGLOBIN A1C: Hgb A1c MFr Bld: 5.3 % (ref 4.6–6.5)

## 2021-06-15 ENCOUNTER — Other Ambulatory Visit: Payer: Self-pay

## 2021-06-15 ENCOUNTER — Ambulatory Visit: Payer: BC Managed Care – PPO | Attending: Family Medicine | Admitting: Physical Therapy

## 2021-06-15 DIAGNOSIS — M25511 Pain in right shoulder: Secondary | ICD-10-CM | POA: Diagnosis not present

## 2021-06-15 DIAGNOSIS — M25611 Stiffness of right shoulder, not elsewhere classified: Secondary | ICD-10-CM | POA: Insufficient documentation

## 2021-06-15 DIAGNOSIS — M6281 Muscle weakness (generalized): Secondary | ICD-10-CM | POA: Insufficient documentation

## 2021-06-15 NOTE — Therapy (Signed)
[facility] [address] [place], [place], [address] Phone: [phone]   Fax:  [phone]  Physical Therapy Evaluation  Patient Details  Name: Jessica Silva MRN: [id number] Date of Birth: 1985/09/24 Referring Provider (PT): Dr. Denyse Amass   Encounter Date: 06/15/2021   PT End of Session - 06/15/21 1255     Visit Number 1    Date for PT Re-Evaluation 09/07/21    PT Start Time 1017    PT Stop Time 1103    PT Time Calculation (min) 46 min    Activity Tolerance Patient tolerated treatment well             Past Medical History:  Diagnosis Date   Abnormal uterine bleeding    Amenorrhea    Anxiety    Depression    GERD (gastroesophageal reflux disease)    Hashimoto's thyroiditis    Hormone disorder    Infertility, female    PONV (postoperative nausea and vomiting)    Premature ovarian failure 2002   Psychogenic air hunger     Past Surgical History:  Procedure Laterality Date   CESAREAN SECTION N/A 04/19/2018   Procedure: CESAREAN SECTION;  Surgeon: Sherian Rein, MD;  Location: [facility];  Service: Obstetrics;  Laterality: N/A;   DILATATION & CURETTAGE/HYSTEROSCOPY WITH MYOSURE N/A 05/07/2016   Procedure: DILATATION & CURETTAGE/HYSTEROSCOPY WITH MYOSURE;  Surgeon: Romualdo Bolk, MD;  Location: [facility];  Service: Gynecology;  Laterality: N/A;   DILATION AND CURETTAGE OF UTERUS     radioactive iodine treatment     thyroid   TONSILLECTOMY     TONSILLECTOMY AND ADENOIDECTOMY     WISDOM TOOTH EXTRACTION      There were no vitals filed for this visit.    Subjective Assessment - 06/15/21 1023     Subjective lifting 35 year old;  pushing her on swing;  been going on for months.  comes or goes;    Limitations House hold activities;Lifting    Diagnostic tests per x-ray "bursitis" mild arthritis superior aspect? AC joint    Patient Stated Goals more mobility; able to pick up daughter 37#     Currently in Pain? Yes    Pain Score 3     Pain Location Shoulder    Pain Orientation Right;Anterior   around bra line   Pain Type Acute pain    Aggravating Factors  flexion; lying on right side; lifting daughter; hooking bra    Pain Relieving Factors steroid                [facility] PT Assessment - 06/15/21 0001       Assessment   Medical Diagnosis chronic right shoulder pain    Referring Provider (PT) Dr. Denyse Amass    Onset Date/Surgical Date --   3 months   Hand Dominance Right    Next MD Visit next week      Precautions   Precautions None      Restrictions   Weight Bearing Restrictions No      Balance Screen   Has the patient fallen in the past 6 months No    Has the patient had a decrease in activity level because of a fear of falling?  No    Is the patient reluctant to leave their home because of a fear of falling?  No      Prior Function   Level of Independence Independent with basic ADLs    Vocation  Full time employment    Leisure walking, live music      Observation/Other Assessments   Focus on Therapeutic Outcomes (FOTO)  58%      AROM   Overall AROM Comments dec thoracic extension  mobility    Right Shoulder Flexion 134 Degrees    Right Shoulder ABduction 134 Degrees    Right Shoulder Internal Rotation --   L1 most painful   Right Shoulder External Rotation 72 Degrees    Left Shoulder Flexion 160 Degrees    Left Shoulder ABduction 175 Degrees    Left Shoulder Internal Rotation --   L1   Left Shoulder External Rotation 80 Degrees      PROM   Overall PROM Comments no pain with passive shoulder elevation in supine; glenohumeral hypomobility in all directions      Strength   Right Shoulder Flexion 4-/5    Right Shoulder Extension 4/5    Right Shoulder ABduction 4-/5    Right Shoulder Internal Rotation 4/5    Right Shoulder External Rotation 4/5      Palpation   Palpation comment tender points right upper trap      Hawkins-Kennedy test   Findings  Positive    Side Right      Drop Arm test   Findings Positive    Side Right      Painful Arc of Motion   Findings Positive    Side Right                        Objective measurements completed on examination: See above findings.                  PT Short Term Goals - 06/15/21 1308       PT SHORT TERM GOAL #1   Title The patient will demonstrate knowledge of basic strategies for self care to promote healing    Time 6    Period Weeks    Status New    Target Date 07/27/21      PT SHORT TERM GOAL #2   Title The patient will report a 40% improvement in right shoulder pain with hooking her bra, elevating arm    Time 6    Period Weeks    Status New      PT SHORT TERM GOAL #3   Title The patient will have improved right shoulder elevation to 150 degrees    Time 6    Period Weeks    Status New      PT SHORT TERM GOAL #4   Title FOTO score improved to 68%    Time 6    Period Weeks    Status New               PT Long Term Goals - 06/15/21 1310       PT LONG TERM GOAL #1   Title The patient will be independent in safe self progression of HEP    Time 12    Period Weeks    Status New    Target Date 09/07/21      PT LONG TERM GOAL #2   Title The patient will report a 75% improvement in right shoulder pain with elevation, sleeping on right side, picking up her daughter    Time 45    Period Weeks    Status New      PT LONG TERM GOAL #3   Title Right shoulder elevation  to 160 degrees    Time 12    Period Weeks    Status New      PT LONG TERM GOAL #4   Title Right shoulder and scapular strength grossly 4+/5 to 5/5 needed for lifting her daughter    Time 12    Period Weeks    Status New      PT LONG TERM GOAL #5   Title FOTO score improved to 75%    Time 12    Period Weeks    Status New                    Plan - 06/15/21 1252     Clinical Impression Statement The patient reports the onset of right anterior and  superior shoulder pain about 3 months ago for no clear reason but suspects it may be related to repeated lifting of her 35 year old and pushing her on the swing.  Painful with elevation, internal rotation behind the back, and right sidelying.  Limited ROM in all planes: flexion 134 degrees, abduction 134, external rotation 72, internal rotation to L1.  Strength grossly 4-/5.  + Leanord Asal; + painful arc, +pain with resisted abduction.  Glenohumeral hypomobility and tender points in upper trap.  She would benefit from PT to address these issues.    Personal Factors and Comorbidities Finances   high insurance co-pay limiting treatment frequency   Examination-Activity Limitations Lift;Reach Overhead;Carry;Other    Examination-Participation Restrictions Meal Prep;Cleaning;Occupation;Other    Stability/Clinical Decision Making Stable/Uncomplicated    Clinical Decision Making Low    Rehab Potential Good    PT Frequency Biweekly    PT Duration 12 weeks    PT Treatment/Interventions ADLs/Self Care Home Management;Electrical Stimulation;Cryotherapy;Ultrasound;Moist Heat;Iontophoresis 4mg /ml Dexamethasone;Therapeutic exercise;Therapeutic activities;Manual techniques;Dry needling;Taping;Patient/family education    PT Next Visit Plan right glenohumeral joint mobs;  scap mobs;  ionto right anterior/superior shoulder;  DN upper trap;  review HEP prone extension, rows, supine Flexion; thoracic extension; scapular ex's, wall ex's, band ex's, wall push ups    PT Home Exercise Plan [email or web address]    Consulted and Agree with Plan of Care Patient             Patient will benefit from skilled therapeutic intervention in order to improve the following deficits and impairments:  Decreased range of motion, Increased fascial restricitons, Increased muscle spasms, Impaired UE functional use, Pain, Decreased strength, Impaired perceived functional ability  Visit Diagnosis: Acute pain of right shoulder - Plan: PT plan of  care cert/re-cert  Stiffness of right shoulder, not elsewhere classified - Plan: PT plan of care cert/re-cert  Muscle weakness (generalized) - Plan: PT plan of care cert/re-cert     Problem List Patient Active Problem List   Diagnosis Date Noted   Palpitations 06/30/2020   Tachycardia 06/24/2020   S/P cesarean section 04/19/2018   Pregnant 04/17/2018   Thyroid disease during pregnancy in second trimester    [redacted] weeks gestation of pregnancy    Obesity complicating pregnancy in second trimester    Encounter for screening for coronary artery disease    Premature ovarian failure 05/20/2016   Depression, recurrent (HCC) 07/17/2015   Weight gain 07/14/2013   13/01/2013, PT 06/15/21 1:14 PM Phone: [phone] Fax: [phone]  [phone], PT 06/15/2021, 1:14 PM  [facility] @ [facility] [address] [place], [place], [place] Phone: [phone]   Fax:  [phone]  Name: Jessica Silva MRN: [name]  Date of Birth: 07-Jul-1986

## 2021-06-15 NOTE — Patient Instructions (Signed)
Access Code: [email or web address] URL: [email or web address] Date: 06/15/2021 Prepared by: Lavinia Sharps  Exercises Standing Shoulder Inferior Glide with Towel Roll - 1 x daily - 7 x weekly - 1 sets - 10 reps Seated Thoracic Lumbar Extension - 1 x daily - 7 x weekly - 1 sets - 10 reps Supine Shoulder Flexion PROM (Mirrored) - 2 x daily - 7 x weekly - 1 sets - 10 reps Prone Shoulder Extension - Single Arm with Dumbbell - 1 x daily - 7 x weekly - 1 sets - 10 reps Prone Shoulder Row (Mirrored) - 1 x daily - 7 x weekly - 3 sets - 10 reps

## 2021-06-19 ENCOUNTER — Encounter: Payer: Self-pay | Admitting: Family Medicine

## 2021-06-22 ENCOUNTER — Ambulatory Visit: Payer: BC Managed Care – PPO | Admitting: Family Medicine

## 2021-06-29 ENCOUNTER — Other Ambulatory Visit: Payer: Self-pay

## 2021-06-29 ENCOUNTER — Ambulatory Visit: Payer: BC Managed Care – PPO | Admitting: Physical Therapy

## 2021-06-29 DIAGNOSIS — M25511 Pain in right shoulder: Secondary | ICD-10-CM

## 2021-06-29 DIAGNOSIS — M6281 Muscle weakness (generalized): Secondary | ICD-10-CM

## 2021-06-29 DIAGNOSIS — M25611 Stiffness of right shoulder, not elsewhere classified: Secondary | ICD-10-CM

## 2021-06-29 NOTE — Therapy (Signed)
[facility] [address] [place], [place], [address] Phone: [phone]   Fax:  [phone]  Physical Therapy Treatment  Patient Details  Name: Jessica Silva MRN: [id number] Date of Birth: May 29, 1986 Referring Provider (PT): Dr. Denyse Amass   Encounter Date: 06/29/2021   PT End of Session - 06/29/21 1123     Visit Number 2    Date for PT Re-Evaluation 09/07/21    Authorization Type 30    PT Start Time 0930    PT Stop Time 1014    PT Time Calculation (min) 44 min    Activity Tolerance Patient tolerated treatment well             Past Medical History:  Diagnosis Date   Abnormal uterine bleeding    Amenorrhea    Anxiety    Depression    GERD (gastroesophageal reflux disease)    Hashimoto's thyroiditis    Hormone disorder    Infertility, female    PONV (postoperative nausea and vomiting)    Premature ovarian failure 2002   Psychogenic air hunger     Past Surgical History:  Procedure Laterality Date   CESAREAN SECTION N/A 04/19/2018   Procedure: CESAREAN SECTION;  Surgeon: Sherian Rein, MD;  Location: [facility];  Service: Obstetrics;  Laterality: N/A;   DILATATION & CURETTAGE/HYSTEROSCOPY WITH MYOSURE N/A 05/07/2016   Procedure: DILATATION & CURETTAGE/HYSTEROSCOPY WITH MYOSURE;  Surgeon: Romualdo Bolk, MD;  Location: [facility];  Service: Gynecology;  Laterality: N/A;   DILATION AND CURETTAGE OF UTERUS     radioactive iodine treatment     thyroid   TONSILLECTOMY     TONSILLECTOMY AND ADENOIDECTOMY     WISDOM TOOTH EXTRACTION      There were no vitals filed for this visit.   Subjective Assessment - 06/29/21 0932     Subjective A little better in the shoulder.  I have more mobility with internal rotation, elevation, horizontal abduction.    Pain Score 0-No pain    Pain Location Shoulder    Pain Orientation Right    Pain Type Acute pain                                [facility] Adult PT Treatment/Exercise - 06/29/21 0001       Shoulder Exercises: Prone   Extension Right;Strengthening;10 reps    Other Prone Exercises prone row 10x empasis on scap squeeze      Shoulder Exercises: Standing   External Rotation Strengthening;Right;15 reps    Theraband Level (Shoulder External Rotation) Level 3 (Green)    Internal Rotation Strengthening;Right;15 reps    Theraband Level (Shoulder Internal Rotation) Level 3 (Green)    Extension Strengthening;Right;15 reps    Theraband Level (Shoulder Extension) Level 3 (Green)    Row Strengthening;Right;15 reps    Theraband Level (Shoulder Row) Level 3 (Green)    Other Standing Exercises wall push up 10x      Iontophoresis   Type of Iontophoresis Dexamethasone    Location right ant/sup shoulder    Dose 4 mg/ml patch    Time 4-6 hours      Manual Therapy   Joint Mobilization glenohumeral joint distraction, posterior and inferior grade 3 3x 30 sec each    Soft tissue mobilization right upper trap              Trigger Point Dry Needling - 06/29/21 0001  Consent Given? Yes    Education Handout Provided Yes    Muscles Treated Head and Neck Upper trapezius    Other Dry Needling right    Upper Trapezius Response Palpable increased muscle length                     PT Short Term Goals - 06/15/21 1308       PT SHORT TERM GOAL #1   Title The patient will demonstrate knowledge of basic strategies for self care to promote healing    Time 6    Period Weeks    Status New    Target Date 07/27/21      PT SHORT TERM GOAL #2   Title The patient will report a 40% improvement in right shoulder pain with hooking her bra, elevating arm    Time 6    Period Weeks    Status New      PT SHORT TERM GOAL #3   Title The patient will have improved right shoulder elevation to 150 degrees    Time 6    Period Weeks    Status New      PT SHORT TERM GOAL #4   Title FOTO  score improved to 68%    Time 6    Period Weeks    Status New               PT Long Term Goals - 06/15/21 1310       PT LONG TERM GOAL #1   Title The patient will be independent in safe self progression of HEP    Time 12    Period Weeks    Status New    Target Date 09/07/21      PT LONG TERM GOAL #2   Title The patient will report a 75% improvement in right shoulder pain with elevation, sleeping on right side, picking up her daughter    Time 40    Period Weeks    Status New      PT LONG TERM GOAL #3   Title Right shoulder elevation to 160 degrees    Time 12    Period Weeks    Status New      PT LONG TERM GOAL #4   Title Right shoulder and scapular strength grossly 4+/5 to 5/5 needed for lifting her daughter    Time 12    Period Weeks    Status New      PT LONG TERM GOAL #5   Title FOTO score improved to 75%    Time 12    Period Weeks    Status New                   Plan - 06/29/21 1123     Clinical Impression Statement The patient reports decreasing pain intensity and improving shoulder mobility overall.  Review of inital HEP performed and progressed periscapular muscle activation and rotator cuff low level stability ex's.  No complaint of pain.  Verbal cues to avoid upper trap over compensation.    Rehab Potential Good    PT Frequency Biweekly    PT Duration 12 weeks    PT Treatment/Interventions ADLs/Self Care Home Management;Electrical Stimulation;Cryotherapy;Ultrasound;Moist Heat;Iontophoresis 4mg /ml Dexamethasone;Therapeutic exercise;Therapeutic activities;Manual techniques;Dry needling;Taping;Patient/family education    PT Next Visit Plan recheck shoulder ROM; assess response to ionto and DN;  add prone horizontal abduction and/or band Horizontal abduction; wall ex's;  follow up in 2 weeks  PT Home Exercise Plan [email or web address]             Patient will benefit from skilled therapeutic intervention in order to improve the following deficits and  impairments:  Decreased range of motion, Increased fascial restricitons, Increased muscle spasms, Impaired UE functional use, Pain, Decreased strength, Impaired perceived functional ability  Visit Diagnosis: Acute pain of right shoulder  Stiffness of right shoulder, not elsewhere classified  Muscle weakness (generalized)     Problem List Patient Active Problem List   Diagnosis Date Noted   Palpitations 06/30/2020   Tachycardia 06/24/2020   S/P cesarean section 04/19/2018   Pregnant 04/17/2018   Thyroid disease during pregnancy in second trimester    [redacted] weeks gestation of pregnancy    Obesity complicating pregnancy in second trimester    Encounter for screening for coronary artery disease    Premature ovarian failure 05/20/2016   Depression, recurrent (HCC) 07/17/2015   Weight gain 07/14/2013   Lavinia Sharps, PT 06/29/21 11:29 AM Phone: [phone] Fax: [phone]  Vivien Presto, PT 06/29/2021, 11:29 AM  [facility] [address] [place], [place], [address] Phone: [phone]   Fax:  [phone]  Name: Jessica Silva MRN: [id number] Date of Birth: 09-Sep-1986

## 2021-06-29 NOTE — Patient Instructions (Signed)
Access Code: [email or web address] URL: [email or web address] Date: 06/29/2021 Prepared by: Lavinia Sharps  Exercises Standing Shoulder Inferior Glide with Towel Roll - 1 x daily - 7 x weekly - 1 sets - 10 reps Seated Thoracic Lumbar Extension - 1 x daily - 7 x weekly - 1 sets - 10 reps Supine Shoulder Flexion PROM (Mirrored) - 2 x daily - 7 x weekly - 1 sets - 10 reps Prone Shoulder Extension - Single Arm with Dumbbell - 1 x daily - 7 x weekly - 1 sets - 10 reps Prone Shoulder Row (Mirrored) - 1 x daily - 7 x weekly - 3 sets - 10 reps Standing Low Shoulder Row with Anchored Resistance - 1 x daily - 7 x weekly - 2 sets - 10 reps Single Arm Shoulder Extension with Anchored Resistance - 1 x daily - 7 x weekly - 2 sets - 10 reps Shoulder External Rotation with Anchored Resistance (Mirrored) - 1 x daily - 7 x weekly - 2 sets - 10 reps Standing Shoulder Internal Rotation with Anchored Resistance (Mirrored) - 1 x daily - 7 x weekly - 2 sets - 10 reps Wall Push Up - 1 x daily - 7 x weekly - 2 sets - 10 reps   IONTOPHORESIS PATIENT PRECAUTIONS & CONTRAINDICATIONS:  Redness under one or both electrodes can occur.  This characterized by a uniform redness that usually disappears within 12 hours of treatment. Small pinhead size blisters may result in response to the drug.  Contact your physician if the problem persists more than 24 hours. On rare occasions, iontophoresis therapy can result in temporary skin reactions such as rash, inflammation, irritation or burns.  The skin reactions may be the result of individual sensitivity to the ionic solution used, the condition of the skin at the start of treatment, reaction to the materials in the electrodes, allergies or sensitivity to dexamethasone, or a poor connection between the patch and your skin.  Discontinue using iontophoresis if you have any of these reactions and report to your therapist. Remove the Patch or electrodes if you have any undue sensation  of pain or burning during the treatment and report discomfort to your therapist. Tell your Therapist if you have had known adverse reactions to the application of electrical current. If using the Patch, the LED light will turn off when treatment is complete and the patch can be removed.  Approximate treatment time is 1-3 hours.  Remove the patch when light goes off or after 6 hours. The Patch can be worn during normal activity, however excessive motion where the electrodes have been placed can cause poor contact between the skin and the electrode or uneven electrical current resulting in greater risk of skin irritation. Keep out of the reach of children.   DO NOT use if you have a cardiac pacemaker or any other electrically sensitive implanted device. DO NOT use if you have a known sensitivity to dexamethasone. DO NOT use during Magnetic Resonance Imaging (MRI). DO NOT use over broken or compromised skin (e.g. sunburn, cuts, or acne) due to the increased risk of skin reaction. DO NOT SHAVE over the area to be treated:  To establish good contact between the Patch and the skin, excessive hair may be clipped. DO NOT place the Patch or electrodes on or over your eyes, directly over your heart, or brain. DO NOT reuse the Patch or electrodes as this may cause burns to occur.   Trigger Point Dry Needling  What is Trigger Point  Dry Needling (DN)? DN is a physical therapy technique used to treat muscle pain and dysfunction. Specifically, DN helps deactivate muscle trigger points (muscle knots).  A thin filiform needle is used to penetrate the skin and stimulate the underlying trigger point. The goal is for a local twitch response (LTR) to occur and for the trigger point to relax. No medication of any kind is injected during the procedure.   What Does Trigger Point Dry Needling Feel Like?  The procedure feels different for each individual patient. Some patients report that they do not actually feel  the needle enter the skin and overall the process is not painful. Very mild bleeding may occur. However, many patients feel a deep cramping in the muscle in which the needle was inserted. This is the local twitch response.   How Will I feel after the treatment? Soreness is normal, and the onset of soreness may not occur for a few hours. Typically this soreness does not last longer than two days.  Bruising is uncommon, however; ice can be used to decrease any possible bruising.  In rare cases feeling tired or nauseous after the treatment is normal. In addition, your symptoms may get worse before they get better, this period will typically not last longer than 24 hours.   What Can I do After My Treatment? Increase your hydration by drinking more water for the next 24 hours. You may place ice or heat on the areas treated that have become sore, however, do not use heat on inflamed or bruised areas. Heat often brings more relief post needling. You can continue your regular activities, but vigorous activity is not recommended initially after the treatment for 24 hours. DN is best combined with other physical therapy such as strengthening, stretching, and other therapies.

## 2021-07-01 ENCOUNTER — Other Ambulatory Visit: Payer: Self-pay | Admitting: Cardiology

## 2021-07-01 DIAGNOSIS — R931 Abnormal findings on diagnostic imaging of heart and coronary circulation: Secondary | ICD-10-CM

## 2021-07-04 ENCOUNTER — Telehealth: Payer: BC Managed Care – PPO | Admitting: Physician Assistant

## 2021-07-04 DIAGNOSIS — Z20818 Contact with and (suspected) exposure to other bacterial communicable diseases: Secondary | ICD-10-CM

## 2021-07-04 MED ORDER — AZITHROMYCIN 250 MG PO TABS
ORAL_TABLET | ORAL | 0 refills | Status: AC
Start: 2021-07-04 — End: 2021-07-09

## 2021-07-04 NOTE — Progress Notes (Signed)
I have spent 5 minutes in review of e-visit questionnaire, review and updating patient chart, medical decision making and response to patient.   Jessica Silva Jessica Silva Jessica Cartelli, PA-C    

## 2021-07-04 NOTE — Progress Notes (Signed)

## 2021-07-11 ENCOUNTER — Other Ambulatory Visit: Payer: Self-pay

## 2021-07-11 DIAGNOSIS — R931 Abnormal findings on diagnostic imaging of heart and coronary circulation: Secondary | ICD-10-CM

## 2021-07-11 MED ORDER — ROSUVASTATIN CALCIUM 10 MG PO TABS: 10.0000 mg | ORAL_TABLET | Freq: Every day | ORAL | 0 refills | Status: DC

## 2021-07-13 ENCOUNTER — Other Ambulatory Visit: Payer: Self-pay

## 2021-07-13 ENCOUNTER — Ambulatory Visit: Payer: BC Managed Care – PPO | Attending: Family Medicine | Admitting: Physical Therapy

## 2021-07-13 DIAGNOSIS — M25611 Stiffness of right shoulder, not elsewhere classified: Secondary | ICD-10-CM | POA: Insufficient documentation

## 2021-07-13 DIAGNOSIS — M6281 Muscle weakness (generalized): Secondary | ICD-10-CM | POA: Diagnosis not present

## 2021-07-13 DIAGNOSIS — M25511 Pain in right shoulder: Secondary | ICD-10-CM | POA: Insufficient documentation

## 2021-07-13 NOTE — Patient Instructions (Signed)
Access Code: [email or web address] URL: [email or web address] Date: 07/13/2021 Prepared by: Lavinia Sharps  Exercises Standing Shoulder Inferior Glide with Towel Roll - 1 x daily - 7 x weekly - 1 sets - 10 reps Seated Thoracic Lumbar Extension - 1 x daily - 7 x weekly - 1 sets - 10 reps Supine Shoulder Flexion PROM (Mirrored) - 2 x daily - 7 x weekly - 1 sets - 10 reps Prone Shoulder Extension - Single Arm with Dumbbell - 1 x daily - 7 x weekly - 1 sets - 10 reps Prone Shoulder Row (Mirrored) - 1 x daily - 7 x weekly - 3 sets - 10 reps Standing Low Shoulder Row with Anchored Resistance - 1 x daily - 7 x weekly - 2 sets - 10 reps Single Arm Shoulder Extension with Anchored Resistance - 1 x daily - 7 x weekly - 2 sets - 10 reps Shoulder External Rotation with Anchored Resistance (Mirrored) - 1 x daily - 7 x weekly - 2 sets - 10 reps Standing Shoulder Internal Rotation with Anchored Resistance (Mirrored) - 1 x daily - 7 x weekly - 2 sets - 10 reps Wall Push Up - 1 x daily - 7 x weekly - 2 sets - 10 reps Single Arm Bent Over Shoulder Extension with Dumbbell - 1 x daily - 7 x weekly - 1 sets - 10 reps Single Arm Bent Over Shoulder Horizontal Abduction with Dumbbell - Palm Down - 1 x daily - 7 x weekly - 1 sets - 10 reps Single Arm Bent Over Shoulder Scaption with Dumbbell - 1 x daily - 7 x weekly - 1 sets - 10 reps Push ups on kitchen counter - 1 x daily - 7 x weekly - 1 sets - 10 reps Wall Scoops - 1 x daily - 7 x weekly - 1 sets - 10 reps Standing Low Trap Setting with Resistance at Wall - 1 x daily - 7 x weekly - 1 sets - 10 reps

## 2021-07-13 NOTE — Therapy (Addendum)
[facility] [address] [place], [place], [address] Phone: [phone]   Fax:  [phone]  Physical Therapy Treatment/Discharge Summary   Patient Details  Name: Jessica Silva MRN: [id number] Date of Birth: Aug 11, 1986 Referring Provider (PT): Dr. Georgina Snell   Encounter Date: 07/13/2021   PT End of Session - 07/13/21 1026     Visit Number 3    Date for PT Re-Evaluation 09/07/21    Authorization Type 30    PT Start Time 0930    PT Stop Time 1012    PT Time Calculation (min) 42 min    Activity Tolerance Patient tolerated treatment well             Past Medical History:  Diagnosis Date   Abnormal uterine bleeding    Amenorrhea    Anxiety    Depression    GERD (gastroesophageal reflux disease)    Hashimoto's thyroiditis    Hormone disorder    Infertility, female    PONV (postoperative nausea and vomiting)    Premature ovarian failure 2002   Psychogenic air hunger     Past Surgical History:  Procedure Laterality Date   CESAREAN SECTION N/A 04/19/2018   Procedure: CESAREAN SECTION;  Surgeon: Janyth Contes, MD;  Location: [facility];  Service: Obstetrics;  Laterality: N/A;   DILATATION & CURETTAGE/HYSTEROSCOPY WITH MYOSURE N/A 05/07/2016   Procedure: DILATATION & CURETTAGE/HYSTEROSCOPY WITH MYOSURE;  Surgeon: Salvadore Dom, MD;  Location: [facility];  Service: Gynecology;  Laterality: N/A;   DILATION AND CURETTAGE OF UTERUS     radioactive iodine treatment     thyroid   TONSILLECTOMY     TONSILLECTOMY AND ADENOIDECTOMY     WISDOM TOOTH EXTRACTION      There were no vitals filed for this visit.   Subjective Assessment - 07/13/21 0927     Subjective Feels a lot better.  Pushed daughter on swing yesterday.  Lifting her better.    Diagnostic tests per x-ray "bursitis" mild arthritis superior aspect? AC joint    Patient Stated Goals more mobility; able to pick up daughter 37#    Currently in  Pain? No/denies    Pain Score 0-No pain   feels with lift forward   Pain Location Shoulder    Pain Orientation Right    Pain Type Acute pain                [facility] PT Assessment - 07/13/21 0001       AROM   Right Shoulder Flexion 157 Degrees    Right Shoulder ABduction 163 Degrees    Right Shoulder Internal Rotation --   T10   Right Shoulder External Rotation 80 Degrees      Strength   Right Shoulder Flexion 4/5    Right Shoulder Extension 4+/5    Right Shoulder ABduction 4/5    Right Shoulder Internal Rotation 4+/5    Right Shoulder External Rotation 4+/5      Hawkins-Kennedy test   Findings Negative      Drop Arm test   Findings Negative      Painful Arc of Motion   Findings Negative                           [facility] Adult PT Treatment/Exercise - 07/13/21 0001       Shoulder Exercises: Prone   Other Prone Exercises kneeling on edge of table: shoulder extensions, horizontal  abduction, scaption 10x each      Shoulder Exercises: Standing   Shoulder Elevation Limitations yelow band around wrists: scoops and wall slides/lift offs 10x each    Other Standing Exercises wall push up 10x    Other Standing Exercises counter push up 10x      Shoulder Exercises: Power Warden/ranger Exercises standing lat pull downs 35# 15x   "I really like this"     Iontophoresis   Type of Iontophoresis Dexamethasone    Location right sup shoulder    Dose 4 mg/ml patch    Time 4-6 hours                     PT Education - 07/13/21 1014     Education Details wall band scap exs; counter push ups; kneeling I T Y    Person(s) Educated Patient    Methods Explanation;Demonstration;Handout    Comprehension Returned demonstration;Verbalized understanding              PT Short Term Goals - 06/15/21 1308       PT SHORT TERM GOAL #1   Title The patient will demonstrate knowledge of basic strategies for self care to promote healing    Time 6     Period Weeks    Status New    Target Date 07/27/21      PT SHORT TERM GOAL #2   Title The patient will report a 40% improvement in right shoulder pain with hooking her bra, elevating arm    Time 6    Period Weeks    Status New      PT SHORT TERM GOAL #3   Title The patient will have improved right shoulder elevation to 150 degrees    Time 6    Period Weeks    Status New      PT SHORT TERM GOAL #4   Title FOTO score improved to 68%    Time 6    Period Weeks    Status New               PT Long Term Goals - 06/15/21 1310       PT LONG TERM GOAL #1   Title The patient will be independent in safe self progression of HEP    Time 12    Period Weeks    Status New    Target Date 09/07/21      PT LONG TERM GOAL #2   Title The patient will report a 75% improvement in right shoulder pain with elevation, sleeping on right side, picking up her daughter    Time 23    Period Weeks    Status New      PT LONG TERM GOAL #3   Title Right shoulder elevation to 160 degrees    Time 12    Period Weeks    Status New      PT LONG TERM GOAL #4   Title Right shoulder and scapular strength grossly 4+/5 to 5/5 needed for lifting her daughter    Time 12    Period Weeks    Status New      PT LONG TERM GOAL #5   Title FOTO score improved to 75%    Time 12    Period Weeks    Status New                   Plan - 07/13/21 1027  Clinical Impression Statement Excellent improvements in shoulder ROM in all planes of motion.  Significantly less irritability with with Michel Bickers test and no painful arc.  She reports functional improvements with lifting her daughter and pushing her on the swing.  Shifted ionto placement to more superior aspect of glenohumeral joint.  Therapist providing verbal and tactile cues for improved periscapular muscle activation.    Examination-Activity Limitations Lift;Reach Overhead;Carry;Other    Examination-Participation Restrictions Meal  Prep;Cleaning;Occupation;Other    Rehab Potential Good    PT Frequency Biweekly    PT Duration 12 weeks    PT Treatment/Interventions ADLs/Self Care Home Management;Electrical Stimulation;Cryotherapy;Ultrasound;Moist Heat;Iontophoresis 4mg /ml Dexamethasone;Therapeutic exercise;Therapeutic activities;Manual techniques;Dry needling;Taping;Patient/family education    PT Next Visit Plan standing lat pull down; review I, T, Y leaning on counter, progress scapular stability ex's on wall; weight bearing ex; ionto as needed; follow up in 2 weeks    PT [place]             Patient will benefit from skilled therapeutic intervention in order to improve the following deficits and impairments:  Decreased range of motion, Increased fascial restricitons, Increased muscle spasms, Impaired UE functional use, Pain, Decreased strength, Impaired perceived functional ability  Visit Diagnosis: Acute pain of right shoulder  Stiffness of right shoulder, not elsewhere classified  Muscle weakness (generalized)   PHYSICAL THERAPY DISCHARGE SUMMARY  Visits from Start of Care: 3  Current functional level related to goals / functional outcomes: The patient cancelled her last scheduled appt to see a sports med specialist.  She has not called to reschedule in the last 2 months.  Will discharge from PT at this time.     Remaining deficits: As above   Education / Equipment: HEP   Patient agrees to discharge. Patient goals were partially met. Patient is being discharged due to not returning since the last visit.   Problem List Patient Active Problem List   Diagnosis Date Noted   Palpitations 06/30/2020   Tachycardia 06/24/2020   S/P cesarean section 04/19/2018   Pregnant 04/17/2018   Thyroid disease during pregnancy in second trimester    [redacted] weeks gestation of pregnancy    Obesity complicating pregnancy in second trimester    Encounter for screening for coronary artery disease     Premature ovarian failure 05/20/2016   Depression, recurrent ([facility]) 07/17/2015   Weight gain 07/14/2013   Ruben Im, PT 07/13/21 10:32 AM Phone: [phone] Fax: [phone]  Alvera Singh, PT 07/13/2021, 10:32 AM  [facility] [address] [place], [place], [address] Phone: [phone]   Fax:  [phone]  Name: Jessica Silva MRN: [id number] Date of Birth: 1986-04-24

## 2021-07-23 ENCOUNTER — Other Ambulatory Visit: Payer: Self-pay

## 2021-07-23 ENCOUNTER — Ambulatory Visit: Payer: BC Managed Care – PPO | Admitting: Cardiology

## 2021-07-23 ENCOUNTER — Encounter: Payer: Self-pay | Admitting: Cardiology

## 2021-07-23 VITALS — BP 124/77 | HR 86 | Temp 97.8°F | Resp 16 | Ht 61.0 in | Wt 277.0 lb

## 2021-07-23 DIAGNOSIS — I493 Ventricular premature depolarization: Secondary | ICD-10-CM

## 2021-07-23 DIAGNOSIS — R931 Abnormal findings on diagnostic imaging of heart and coronary circulation: Secondary | ICD-10-CM

## 2021-07-23 DIAGNOSIS — R7982 Elevated C-reactive protein (CRP): Secondary | ICD-10-CM

## 2021-07-23 MED ORDER — METOPROLOL SUCCINATE ER 25 MG PO TB24: 25.0000 mg | ORAL_TABLET | Freq: Every day | ORAL | 3 refills | Status: DC

## 2021-07-23 MED ORDER — ROSUVASTATIN CALCIUM 10 MG PO TABS: 10.0000 mg | ORAL_TABLET | Freq: Every day | ORAL | 3 refills | Status: DC

## 2021-07-23 MED ORDER — METOPROLOL SUCCINATE ER 50 MG PO TB24: 25.0000 mg | ORAL_TABLET | Freq: Every day | ORAL | 3 refills | Status: DC

## 2021-07-23 NOTE — Progress Notes (Signed)
Patient referred by Eulas Post, MD for palpitations  Subjective:   Jessica Silva, female    DOB: 14-Apr-1986, 35 y.o.   MRN: [id number]   Chief Complaint  Patient presents with   Palpitations   Follow-up     HPI  35 y.o. Caucasian female with h/o premature ovarian failure at age 63, h/o hashimoto thyroiditis, and Grave's disease s/p radioiodine ablation, obesity, elevated calcium score, symptomatic PVC  Patient is doing well. Palpitations improved with metoprolol succinate, tolerating well in spite of ongoing use of bupropion. She feels better when her resting heart rate is <80 bpm and would like to continue metoprolol. She is also tolerating Crestor well.  On a separate note, she reports that she does not have any plans for pregnancy in the near future.   Initial consultation visit 2021:  Patient is a therapist, has her own practice. Her physical activity has been limited due to work stress. She is trying weight loss programs. She has had intermittent episodes of palpitations, lasting for a few min, not associated with chest pain, shortness of breath, presyncope or syncope.  During her thyroid toxicity.  With Graves' disease, she had resting tachycardia, but did not have any other tachyarrhythmias.  Patient recently underwent CRP testing through her weight loss program, it was mildly elevated at 8.  She had recently had her Covid booster few days prior, and also has a tooth that she has undergone root canal for without crown placement.    Current Outpatient Medications on File Prior to Visit  Medication Sig Dispense Refill   acetaminophen (TYLENOL) 325 MG tablet Take 325 mg by mouth every 6 (six) hours as needed for headache.     buPROPion (WELLBUTRIN XL) 300 MG 24 hr tablet TAKE 1 TABLET BY MOUTH EVERY DAY 90 tablet 0   ciprofloxacin-dexamethasone (CIPRODEX) OTIC suspension Place 4 drops into the left ear 2 (two) times daily. 7.5 mL 0   ibuprofen (ADVIL,MOTRIN) 800 MG  tablet Take 1 tablet (800 mg total) by mouth every 8 (eight) hours as needed. 45 tablet 1   levothyroxine (SYNTHROID) 175 MCG tablet Take 175 mcg by mouth daily.     metoprolol succinate (TOPROL-XL) 50 MG 24 hr tablet Take 0.5 tablets (25 mg total) by mouth daily. Take with or immediately following a meal. 180 tablet 1   Multiple Vitamins-Minerals (MULTIVITAMIN ADULTS PO) Take 1 tablet by mouth daily.     neomycin-polymyxin-hydrocortisone (CORTISPORIN) OTIC solution Place 3 drops into the left ear 4 (four) times daily. 10 mL 0   Probiotic Product (PROBIOTIC-10 PO) Take 1 tablet by mouth daily.     rosuvastatin (CRESTOR) 10 MG tablet Take 1 tablet (10 mg total) by mouth daily. 30 tablet 0   No current facility-administered medications on file prior to visit.    Cardiovascular and other pertinent studies:  Mobile cardiac telemetry 13 days 06/30/2020 - 07/14/2020: Dominant rhythm: Sinus. HR 55-155 bpm. Avg HR 84 bpm. Rare isolated SVE, no couplet/triplets. Rare isolated VE, no couplet/triplets. No atrial fibrillation/atrial flutter/SVT/VT/high grade AV block, sinus pause >3sec noted. 6 patient triggered events, correlate with ventricular ectopy.   CT cardiac scoring 07/2020: Total score: 6 LM: 1 LAD: 4 Lcx: 1 RCA: 0 Minimal calcified coronary artery plaque however this puts the patient over the 90th percentile for age  EKG 06/30/2020: Sinus rhythm 77 bpm  Normal EKG  Recent labs: 05/25/2021: Glucose 87, BUN/Cr 14/1.0. EGFR 73. Na/K 138/4.0. Rest of the CMP normal H/H 14/41.  MCV 88. Platelets 212 HbA1C 5.3% TSH 1.7 normal  9/.21/2021: TSH 3.2  02/25/2020: TSH 56 high Free T4 1.2  10/22/2019: Glucose 107, BUN/Cr 16/0.72. EGFR >60. Na/K 141/3.9. Rest of the CMP normal H/H 14/42. MCV 87. Platelets 225 TSH <0.001 low Trop HS <2  05/2019: Chol 164, TG 203, HDL 46, LDL 97    Review of Systems  Cardiovascular:  Negative for chest pain, dyspnea on exertion, leg swelling,  palpitations and syncope.        Vitals:   07/23/21 1119  BP: 124/77  Pulse: 86  Resp: 16  Temp: 97.8 F (36.6 C)  SpO2: 100%     Body mass index is 52.34 kg/m. Filed Weights   07/23/21 1119  Weight: 277 lb (125.6 kg)     Objective:   Physical Exam Vitals and nursing note reviewed.  Constitutional:      General: She is not in acute distress.    Appearance: She is obese.  Neck:     Vascular: No JVD.  Cardiovascular:     Rate and Rhythm: Normal rate and regular rhythm.     Heart sounds: Normal heart sounds. No murmur heard. Pulmonary:     Effort: Pulmonary effort is normal.     Breath sounds: Normal breath sounds. No wheezing or rales.  Musculoskeletal:     Right lower leg: No edema.     Left lower leg: No edema.          Assessment & Recommendations:    35 y.o. Caucasian female with h/o premature ovarian failure at age 30, h/o hashimoto thyroiditis, and Grave's disease s/p radioiodine ablation, obesity, elevated calcium score, symptomatic PVC  Elevated calcium score: Score of 6, 90th percentile. Continue Crestor 20 mg daily. Check lipid panel, lipoprotein (a), CRP-which was previously elevated. Should she get pregnant, I recommend holding statin until after lactation is completed.  Symptomatic PVC: Well controlled with 25 mg metoprolol succinate.  F/u in 1 year   Nigel Mormon, MD Pager: [phone] Office: [phone]

## 2021-07-27 ENCOUNTER — Encounter: Payer: BC Managed Care – PPO | Admitting: Physical Therapy

## 2021-07-27 DIAGNOSIS — R931 Abnormal findings on diagnostic imaging of heart and coronary circulation: Secondary | ICD-10-CM | POA: Diagnosis not present

## 2021-07-27 DIAGNOSIS — R7982 Elevated C-reactive protein (CRP): Secondary | ICD-10-CM | POA: Diagnosis not present

## 2021-07-30 LAB — LIPID PANEL
Chol/HDL Ratio: 2.6 ratio (ref 0.0–4.4)
Cholesterol, Total: 110 mg/dL (ref 100–199)
HDL: 43 mg/dL (ref >39)
LDL Chol Calc (NIH): 49 mg/dL (ref 0–99)
Triglycerides: 91 mg/dL (ref 0–149)
VLDL Cholesterol Cal: 18 mg/dL (ref 5–40)

## 2021-07-30 LAB — C-REACTIVE PROTEIN: CRP: 8 mg/L (ref 0–10)

## 2021-07-30 LAB — LIPOPROTEIN A (LPA): Lipoprotein (a): 50.8 nmol/L (ref <75.0)

## 2021-08-10 ENCOUNTER — Encounter: Payer: BC Managed Care – PPO | Admitting: Physical Therapy

## 2021-08-21 ENCOUNTER — Other Ambulatory Visit: Payer: Self-pay | Admitting: Family Medicine

## 2021-08-26 ENCOUNTER — Telehealth: Payer: BC Managed Care – PPO | Admitting: Family

## 2021-08-26 DIAGNOSIS — J069 Acute upper respiratory infection, unspecified: Secondary | ICD-10-CM | POA: Diagnosis not present

## 2021-08-26 MED ORDER — CETIRIZINE HCL 10 MG PO TABS: 10.0000 mg | ORAL_TABLET | Freq: Every day | ORAL | 11 refills | Status: DC

## 2021-08-26 MED ORDER — FLUTICASONE PROPIONATE 50 MCG/ACT NA SUSP: 2.0000 | Freq: Every day | NASAL | 6 refills | Status: DC

## 2021-08-26 NOTE — Progress Notes (Signed)

## 2021-09-05 ENCOUNTER — Telehealth: Payer: BC Managed Care – PPO | Admitting: Family

## 2021-09-05 DIAGNOSIS — U071 COVID-19: Secondary | ICD-10-CM

## 2021-09-05 MED ORDER — BENZONATATE 100 MG PO CAPS
100.0000 mg | ORAL_CAPSULE | Freq: Three times a day (TID) | ORAL | 0 refills | Status: DC | PRN
Start: 2021-09-05 — End: 2022-07-06

## 2021-09-05 MED ORDER — ALBUTEROL SULFATE HFA 108 (90 BASE) MCG/ACT IN AERS: 2.0000 | INHALATION_SPRAY | Freq: Four times a day (QID) | RESPIRATORY_TRACT | 0 refills | Status: DC | PRN

## 2021-09-05 MED ORDER — FLUTICASONE PROPIONATE 50 MCG/ACT NA SUSP: 2.0000 | Freq: Every day | NASAL | 6 refills | Status: DC

## 2021-09-05 NOTE — Progress Notes (Signed)
  E-Visit  for Positive Covid Test Result  We are sorry you are not feeling well. We are here to help!  You have tested positive for COVID-19, meaning that you were infected with the novel coronavirus and could give the virus to others.  It is vitally important that you stay home so you do not spread it to others.      Please continue isolation at home, for at least 10 days since the start of your symptoms and until you have had 24 hours with no fever (without taking a fever reducer) and with improving of symptoms.  If you have no symptoms but tested positive (or all symptoms resolve after 5 days and you have no fever) you can leave your house but continue to wear a mask around others for an additional 5 days. If you have a fever,continue to stay home until you have had 24 hours of no fever. Most cases improve 5-10 days from onset but we have seen a small number of patients who have gotten worse after the 10 days.  Please be sure to watch for worsening symptoms and remain taking the proper precautions.   Go to the nearest hospital ED for assessment if fever/cough/breathlessness are severe or illness seems like a threat to life.    The following symptoms may appear 2-14 days after exposure: Fever Cough Shortness of breath or difficulty breathing Chills Repeated shaking with chills Muscle pain Headache Sore throat New loss of taste or smell Fatigue Congestion or runny nose Nausea or vomiting Diarrhea  You have been enrolled in MyChart Home Monitoring for COVID-19. Daily you will receive a questionnaire within the MyChart website. Our COVID-19 response team will be monitoring your responses daily.  You can use medication such as prescription cough medication called Tessalon Perles 100 mg. You may take 1-2 capsules every 8 hours as needed for cough,  prescription inhaler called Albuterol MDI 90 mcg /actuation 2 puffs every 4 hours as needed for shortness of breath, wheezing, cough, and  prescription for Fluticasone nasal spray 2 sprays in each nostril one time per day  You may also take acetaminophen (Tylenol) as needed for fever.  HOME CARE: Only take medications as instructed by your medical team. Drink plenty of fluids and get plenty of rest. A steam or ultrasonic humidifier can help if you have congestion.   GET HELP RIGHT AWAY IF YOU HAVE EMERGENCY WARNING SIGNS.  Call 911 or proceed to your closest emergency facility if: You develop worsening high fever. Trouble breathing Bluish lips or face Persistent pain or pressure in the chest New confusion Inability to wake or stay awake You cough up blood. Your symptoms become more severe Inability to hold down food or fluids  This list is not all possible symptoms. Contact your medical provider for any symptoms that are severe or concerning to you.    Your e-visit answers were reviewed by a board certified advanced clinical practitioner to complete your personal care plan.  Depending on the condition, your plan could have included both over the counter or prescription medications.  If there is a problem please reply once you have received a response from your provider.  Your safety is important to us.  If you have drug allergies check your prescription carefully.    You can use MyChart to ask questions about today's visit, request a non-urgent call back, or ask for a work or school excuse for 24 hours related to this e-Visit. If it has   been greater than 24 hours you will need to follow up with your provider, or enter a new e-Visit to address those concerns. You will get an e-mail in the next two days asking about your experience.  I hope that your e-visit has been valuable and will speed your recovery. Thank you for using e-visits.    Approximately 5 minutes was spent documenting and reviewing patient's chart.    

## 2021-09-07 ENCOUNTER — Encounter: Payer: Self-pay | Admitting: Family Medicine

## 2021-09-07 MED ORDER — MOLNUPIRAVIR EUA 200MG CAPSULE: 4.0000 | ORAL_CAPSULE | Freq: Two times a day (BID) | ORAL | 0 refills | Status: AC

## 2021-09-11 ENCOUNTER — Telehealth: Payer: BC Managed Care – PPO | Admitting: Physician Assistant

## 2021-09-11 DIAGNOSIS — B9689 Other specified bacterial agents as the cause of diseases classified elsewhere: Secondary | ICD-10-CM

## 2021-09-11 MED ORDER — DOXYCYCLINE HYCLATE 100 MG PO TABS: 100.0000 mg | ORAL_TABLET | Freq: Two times a day (BID) | ORAL | 0 refills | Status: DC

## 2021-09-11 NOTE — Progress Notes (Signed)

## 2021-10-12 ENCOUNTER — Ambulatory Visit: Payer: BC Managed Care – PPO | Admitting: Family Medicine

## 2021-10-12 VITALS — BP 120/70 | HR 90 | Temp 98.0°F | Wt 264.6 lb

## 2021-10-12 DIAGNOSIS — J029 Acute pharyngitis, unspecified: Secondary | ICD-10-CM | POA: Diagnosis not present

## 2021-10-12 LAB — POCT RAPID STREP A (OFFICE): Rapid Strep A Screen: NEGATIVE

## 2021-10-12 NOTE — Progress Notes (Signed)
Established Patient Office Visit  Subjective:  Patient ID: Jessica Silva, female    DOB: June 08, 1986  Age: 36 y.o. MRN: [id number]  CC:  Chief Complaint  Patient presents with   Sore Throat    X 1 month off ad on, sore throat, productive cough, drainage.     HPI MERYLE PUGMIRE presents for predominantly sore throat symptoms for the past 5 to 6 days.  She has had some very mild cough and some postnasal drainage.  She has a young daughter who has had a few upper respiratory infections recently and wonders if she may be getting stuff from her.  No definite fever with recent sore throat.  Cough has been relatively mild.  No daily headaches.  No localizing facial pain.  She has taken Tylenol with some relief.  She is aware that several children have had strep throat recently at her daughter's daycare  Past Medical History:  Diagnosis Date   Abnormal uterine bleeding    Amenorrhea    Anxiety    Depression    GERD (gastroesophageal reflux disease)    Hashimoto's thyroiditis    Hormone disorder    Infertility, female    PONV (postoperative nausea and vomiting)    Premature ovarian failure 2002   Psychogenic air hunger     Past Surgical History:  Procedure Laterality Date   CESAREAN SECTION N/A 04/19/2018   Procedure: CESAREAN SECTION;  Surgeon: Sherian Rein, MD;  Location: [facility];  Service: Obstetrics;  Laterality: N/A;   DILATATION & CURETTAGE/HYSTEROSCOPY WITH MYOSURE N/A 05/07/2016   Procedure: DILATATION & CURETTAGE/HYSTEROSCOPY WITH MYOSURE;  Surgeon: Romualdo Bolk, MD;  Location: [facility];  Service: Gynecology;  Laterality: N/A;   DILATION AND CURETTAGE OF UTERUS     radioactive iodine treatment     thyroid   TONSILLECTOMY     TONSILLECTOMY AND ADENOIDECTOMY     WISDOM TOOTH EXTRACTION      Family History  Problem Relation Age of Onset   Hiatal hernia Mother    Atrial fibrillation Father    Hypertension Sister    Polycystic ovary syndrome Sister     Irritable bowel syndrome Brother    Hiatal hernia Maternal Grandmother    Irritable bowel syndrome Brother     Social History   Socioeconomic History   Marital status: Married    Spouse name: Not on file   Number of children: 1   Years of education: Not on file   Highest education level: Master's degree (e.g., MA, MS, MEng, MEd, MSW, MBA)  Occupational History   Occupation: lake Psychologist, forensic: [employer]  Tobacco Use   Smoking status: Former    Packs/day: 0.10    Years: 1.00    Pack years: 0.10    Types: Cigarettes    Quit date: 2015    Years since quitting: 8.0   Smokeless tobacco: Never  Vaping Use   Vaping Use: Never used  Substance and Sexual Activity   Alcohol use: Yes    Alcohol/week: 0.0 - 1.0 standard drinks    Comment: occasional   Drug use: No   Sexual activity: Yes    Partners: Male    Birth control/protection: None    Comment: has premature ovarian failure  Other Topics Concern   Not on file  Social History Narrative   Not on file   Social Determinants of Health   Financial Resource Strain: Low Risk    Difficulty of Paying Living  Expenses: Not very hard  Food Insecurity: No Food Insecurity   Worried About Programme researcher, broadcasting/film/videounning Out of Food in the Last Year: Never true   Ran Out of Food in the Last Year: Never true  Transportation Needs: No Transportation Needs   Lack of Transportation (Medical): No   Lack of Transportation (Non-Medical): No  Physical Activity: Insufficiently Active   Days of Exercise per Week: 2 days   Minutes of Exercise per Session: 20 min  Stress: No Stress Concern Present   Feeling of Stress : Only a little  Social Connections: Press photographerocially Integrated   Frequency of Communication with Friends and Family: More than three times a week   Frequency of Social Gatherings with Friends and Family: Once a week   Attends Religious Services: 1 to 4 times per year   Active Member of [employer] or Organizations: Yes   Attends Hospital doctorClub or Organization  Meetings: More than 4 times per year   Marital Status: Married  Catering managerntimate Partner Violence: Not on file    Outpatient Medications Prior to Visit  Medication Sig Dispense Refill   acetaminophen (TYLENOL) 325 MG tablet Take 325 mg by mouth every 6 (six) hours as needed for headache.     albuterol (VENTOLIN HFA) 108 (90 Base) MCG/ACT inhaler Inhale 2 puffs into the lungs every 6 (six) hours as needed for wheezing or shortness of breath. 8 g 0   benzonatate (TESSALON PERLES) 100 MG capsule Take 1 capsule (100 mg total) by mouth 3 (three) times daily as needed. 20 capsule 0   buPROPion (WELLBUTRIN XL) 300 MG 24 hr tablet TAKE 1 TABLET BY MOUTH EVERY DAY 90 tablet 0   cetirizine (ZYRTEC) 10 MG tablet Take 1 tablet (10 mg total) by mouth daily. 30 tablet 11   doxycycline (VIBRA-TABS) 100 MG tablet Take 1 tablet (100 mg total) by mouth 2 (two) times daily. 20 tablet 0   fluticasone (FLONASE) 50 MCG/ACT nasal spray Place 2 sprays into both nostrils daily. 16 g 6   ibuprofen (ADVIL,MOTRIN) 800 MG tablet Take 1 tablet (800 mg total) by mouth every 8 (eight) hours as needed. 45 tablet 1   levothyroxine (SYNTHROID) 175 MCG tablet Take 175 mcg by mouth daily.     metoprolol succinate (TOPROL-XL) 25 MG 24 hr tablet Take 1 tablet (25 mg total) by mouth daily. Take with or immediately following a meal. 90 tablet 3   MOUNJARO 7.5 MG/0.5ML Pen SMARTSIG:7.5 Milligram(s) SUB-Q Once a Week     Multiple Vitamins-Minerals (MULTIVITAMIN ADULTS PO) Take 1 tablet by mouth daily.     Probiotic Product (PROBIOTIC-10 PO) Take 1 tablet by mouth daily.     rosuvastatin (CRESTOR) 10 MG tablet Take 1 tablet (10 mg total) by mouth daily. 90 tablet 3   Vitamin D, Cholecalciferol, 25 MCG (1000 UT) CAPS Take by mouth.     No facility-administered medications prior to visit.    Allergies  Allergen Reactions   Bee Venom Hives   Penicillins Hives    Has patient had a PCN reaction causing immediate rash, facial/tongue/throat  swelling, SOB or lightheadedness with hypotension: unknown Has patient had a PCN reaction causing severe rash involving mucus membranes or skin necrosis: unknown Has patient had a PCN reaction that required hospitalization no Has patient had a PCN reaction occurring within the last 10 years: no If all of the above answers are "NO", then may proceed with Cephalosporin use.     ROS Review of Systems  Constitutional:  Negative for chills  and fever.  HENT:  Positive for sore throat. Negative for ear pain.   Respiratory:  Positive for cough. Negative for shortness of breath and wheezing.      Objective:    Physical Exam Vitals reviewed.  Constitutional:      General: She is not in acute distress.    Appearance: She is well-developed. She is not ill-appearing.  HENT:     Right Ear: Tympanic membrane normal.     Left Ear: Tympanic membrane normal.     Mouth/Throat:     Mouth: Mucous membranes are moist.     Comments: Mild posterior pharynx erythema.  No exudate. Cardiovascular:     Rate and Rhythm: Normal rate and regular rhythm.  Pulmonary:     Effort: Pulmonary effort is normal.     Breath sounds: Normal breath sounds.  Musculoskeletal:     Cervical back: Neck supple.  Lymphadenopathy:     Cervical: No cervical adenopathy.  Neurological:     Mental Status: She is alert.    BP 120/70 (BP Location: Left Arm, Patient Position: Sitting, Cuff Size: Normal)    Pulse 90    Temp 98 F (36.7 C) (Oral)    Wt 264 lb 9.6 oz (120 kg)    SpO2 98%    BMI 50.00 kg/m  Wt Readings from Last 3 Encounters:  10/12/21 264 lb 9.6 oz (120 kg)  07/23/21 277 lb (125.6 kg)  05/22/21 288 lb 3.2 oz (130.7 kg)     Health Maintenance Due  Topic Date Due   Hepatitis C Screening  Never done   PAP SMEAR-Modifier  04/12/2019   COVID-19 Vaccine (2 - Pfizer series) 07/06/2021    There are no preventive care reminders to display for this patient.  Lab Results  Component Value Date   TSH 1.77  05/25/2021   Lab Results  Component Value Date   WBC 7.2 05/25/2021   HGB 14.1 05/25/2021   HCT 41.8 05/25/2021   MCV 88.9 05/25/2021   PLT 212.0 05/25/2021   Lab Results  Component Value Date   NA 138 05/25/2021   K 4.0 05/25/2021   CO2 28 05/25/2021   GLUCOSE 87 05/25/2021   BUN 14 05/25/2021   CREATININE 1.00 05/25/2021   BILITOT 0.7 05/25/2021   ALKPHOS 42 05/25/2021   AST 18 05/25/2021   ALT 25 05/25/2021   PROT 6.7 05/25/2021   ALBUMIN 4.0 05/25/2021   CALCIUM 9.1 05/25/2021   ANIONGAP 9 08/11/2020   GFR 73.52 05/25/2021   Lab Results  Component Value Date   CHOL 110 07/27/2021   Lab Results  Component Value Date   HDL 43 07/27/2021   Lab Results  Component Value Date   LDLCALC 49 07/27/2021   Lab Results  Component Value Date   TRIG 91 07/27/2021   Lab Results  Component Value Date   CHOLHDL 2.6 07/27/2021   Lab Results  Component Value Date   HGBA1C 5.3 05/25/2021      Assessment & Plan:   Problem List Items Addressed This Visit   None Visit Diagnoses     Sore throat    -  Primary   Relevant Orders   POCT rapid strep A (Completed)     Rapid strep negative.  Suspect viral.  Treat symptomatically at this time.  Be in touch over the next week or so if not improving.  Continue Tylenol as needed  No orders of the defined types were placed in this encounter.  Follow-up: No follow-ups on file.    Carolann Littler, MD

## 2021-11-06 ENCOUNTER — Telehealth: Payer: BC Managed Care – PPO | Admitting: Physician Assistant

## 2021-11-06 DIAGNOSIS — H1032 Unspecified acute conjunctivitis, left eye: Secondary | ICD-10-CM

## 2021-11-06 MED ORDER — POLYMYXIN B-TRIMETHOPRIM 10000-0.1 UNIT/ML-% OP SOLN: OPHTHALMIC | 0 refills | Status: DC

## 2021-11-06 NOTE — Progress Notes (Signed)

## 2021-11-06 NOTE — Progress Notes (Signed)
I have spent 5 minutes in review of e-visit questionnaire, review and updating patient chart, medical decision making and response to patient.   Aspen Lawrance Cody Loucinda Croy, PA-C    

## 2021-11-21 ENCOUNTER — Other Ambulatory Visit: Payer: Self-pay

## 2021-11-21 DIAGNOSIS — I493 Ventricular premature depolarization: Secondary | ICD-10-CM

## 2021-11-21 DIAGNOSIS — R931 Abnormal findings on diagnostic imaging of heart and coronary circulation: Secondary | ICD-10-CM

## 2021-11-21 DIAGNOSIS — R7982 Elevated C-reactive protein (CRP): Secondary | ICD-10-CM

## 2021-11-21 MED ORDER — BUPROPION HCL ER (XL) 300 MG PO TB24: 300.0000 mg | ORAL_TABLET | Freq: Every day | ORAL | 0 refills | Status: DC

## 2021-11-21 MED ORDER — ROSUVASTATIN CALCIUM 10 MG PO TABS: 10.0000 mg | ORAL_TABLET | Freq: Every day | ORAL | 3 refills | Status: DC

## 2021-11-21 MED ORDER — METOPROLOL SUCCINATE ER 25 MG PO TB24: 25.0000 mg | ORAL_TABLET | Freq: Every day | ORAL | 3 refills | Status: DC

## 2021-11-22 ENCOUNTER — Other Ambulatory Visit: Payer: Self-pay | Admitting: Family Medicine

## 2021-12-03 ENCOUNTER — Telehealth: Payer: BC Managed Care – PPO | Admitting: Physician Assistant

## 2021-12-03 DIAGNOSIS — J020 Streptococcal pharyngitis: Secondary | ICD-10-CM

## 2021-12-03 MED ORDER — CEPHALEXIN 500 MG PO CAPS: 500.0000 mg | ORAL_CAPSULE | Freq: Two times a day (BID) | ORAL | 0 refills | Status: AC

## 2021-12-03 NOTE — Progress Notes (Signed)

## 2022-01-25 DIAGNOSIS — E785 Hyperlipidemia, unspecified: Secondary | ICD-10-CM | POA: Diagnosis not present

## 2022-01-25 DIAGNOSIS — E288 Other ovarian dysfunction: Secondary | ICD-10-CM | POA: Diagnosis not present

## 2022-01-25 DIAGNOSIS — E890 Postprocedural hypothyroidism: Secondary | ICD-10-CM | POA: Diagnosis not present

## 2022-01-28 DIAGNOSIS — R000 Tachycardia, unspecified: Secondary | ICD-10-CM | POA: Diagnosis not present

## 2022-01-28 DIAGNOSIS — E890 Postprocedural hypothyroidism: Secondary | ICD-10-CM | POA: Diagnosis not present

## 2022-01-28 DIAGNOSIS — E288 Other ovarian dysfunction: Secondary | ICD-10-CM | POA: Diagnosis not present

## 2022-01-28 DIAGNOSIS — E785 Hyperlipidemia, unspecified: Secondary | ICD-10-CM | POA: Diagnosis not present

## 2022-03-08 DIAGNOSIS — F411 Generalized anxiety disorder: Secondary | ICD-10-CM | POA: Diagnosis not present

## 2022-03-21 DIAGNOSIS — F411 Generalized anxiety disorder: Secondary | ICD-10-CM | POA: Diagnosis not present

## 2022-04-04 DIAGNOSIS — F411 Generalized anxiety disorder: Secondary | ICD-10-CM | POA: Diagnosis not present

## 2022-04-06 ENCOUNTER — Other Ambulatory Visit: Payer: Self-pay | Admitting: Family Medicine

## 2022-04-17 ENCOUNTER — Telehealth: Payer: BC Managed Care – PPO | Admitting: Physician Assistant

## 2022-04-17 DIAGNOSIS — J020 Streptococcal pharyngitis: Secondary | ICD-10-CM

## 2022-04-17 MED ORDER — CEPHALEXIN 500 MG PO CAPS: 500.0000 mg | ORAL_CAPSULE | Freq: Two times a day (BID) | ORAL | 0 refills | Status: DC

## 2022-04-17 NOTE — Progress Notes (Signed)
E-Visit for Sore Throat - Strep Symptoms  We are sorry that you are not feeling well.  Here is how we plan to help!  Based on what you have shared with me it is likely that you have strep pharyngitis.  Strep pharyngitis is inflammation and infection in the back of the throat.  This is an infection cause by bacteria and is treated with antibiotics.  I have prescribed Cephalexin 500 mg twice a day for 10 days. For throat pain, we recommend over the counter oral pain relief medications such as acetaminophen or aspirin, or anti-inflammatory medications such as ibuprofen or naproxen sodium. Topical treatments such as oral throat lozenges or sprays may be used as needed. Strep infections are not as easily transmitted as other respiratory infections, however we still recommend that you avoid close contact with loved ones, especially the very young and elderly.  Remember to wash your hands thoroughly throughout the day as this is the number one way to prevent the spread of infection and wipe down door knobs and counters with disinfectant.   Home Care: Only take medications as instructed by your medical team. Complete the entire course of an antibiotic. Do not take these medications with alcohol. A steam or ultrasonic humidifier can help congestion.  You can place a towel over your head and breathe in the steam from hot water coming from a faucet. Avoid close contacts especially the very young and the elderly. Cover your mouth when you cough or sneeze. Always remember to wash your hands.  Get Help Right Away If: You develop worsening fever or sinus pain. You develop a severe head ache or visual changes. Your symptoms persist after you have completed your treatment plan.  Make sure you Understand these instructions. Will watch your condition. Will get help right away if you are not doing well or get worse.   Thank you for choosing an e-visit.  Your e-visit answers were reviewed by a board  certified advanced clinical practitioner to complete your personal care plan. Depending upon the condition, your plan could have included both over the counter or prescription medications.  Please review your pharmacy choice. Make sure the pharmacy is open so you can pick up prescription now. If there is a problem, you may contact your provider through MyChart messaging and have the prescription routed to another pharmacy.  Your safety is important to us. If you have drug allergies check your prescription carefully.   For the next 24 hours you can use MyChart to ask questions about today's visit, request a non-urgent call back, or ask for a work or school excuse. You will get an email in the next two days asking about your experience. I hope that your e-visit has been valuable and will speed your recovery.  I provided 5 minutes of non face-to-face time during this encounter for chart review and documentation.   

## 2022-04-18 DIAGNOSIS — F411 Generalized anxiety disorder: Secondary | ICD-10-CM | POA: Diagnosis not present

## 2022-05-02 DIAGNOSIS — F411 Generalized anxiety disorder: Secondary | ICD-10-CM | POA: Diagnosis not present

## 2022-05-28 DIAGNOSIS — E890 Postprocedural hypothyroidism: Secondary | ICD-10-CM | POA: Diagnosis not present

## 2022-05-30 DIAGNOSIS — F411 Generalized anxiety disorder: Secondary | ICD-10-CM | POA: Diagnosis not present

## 2022-05-31 DIAGNOSIS — E785 Hyperlipidemia, unspecified: Secondary | ICD-10-CM | POA: Diagnosis not present

## 2022-05-31 DIAGNOSIS — E890 Postprocedural hypothyroidism: Secondary | ICD-10-CM | POA: Diagnosis not present

## 2022-05-31 DIAGNOSIS — E669 Obesity, unspecified: Secondary | ICD-10-CM | POA: Diagnosis not present

## 2022-06-09 ENCOUNTER — Encounter: Payer: Self-pay | Admitting: Cardiology

## 2022-06-10 NOTE — Telephone Encounter (Signed)
From patient.

## 2022-06-27 DIAGNOSIS — F419 Anxiety disorder, unspecified: Secondary | ICD-10-CM | POA: Diagnosis not present

## 2022-06-27 DIAGNOSIS — F5081 Binge eating disorder: Secondary | ICD-10-CM | POA: Diagnosis not present

## 2022-06-27 DIAGNOSIS — F902 Attention-deficit hyperactivity disorder, combined type: Secondary | ICD-10-CM | POA: Diagnosis not present

## 2022-06-28 DIAGNOSIS — F411 Generalized anxiety disorder: Secondary | ICD-10-CM | POA: Diagnosis not present

## 2022-07-05 ENCOUNTER — Other Ambulatory Visit: Payer: Self-pay | Admitting: Family Medicine

## 2022-07-06 ENCOUNTER — Telehealth: Payer: BC Managed Care – PPO | Admitting: Family

## 2022-07-06 DIAGNOSIS — J208 Acute bronchitis due to other specified organisms: Secondary | ICD-10-CM | POA: Diagnosis not present

## 2022-07-06 DIAGNOSIS — B9689 Other specified bacterial agents as the cause of diseases classified elsewhere: Secondary | ICD-10-CM

## 2022-07-06 MED ORDER — PREDNISONE 10 MG (21) PO TBPK: ORAL_TABLET | ORAL | 0 refills | Status: DC

## 2022-07-06 MED ORDER — DOXYCYCLINE HYCLATE 100 MG PO TABS: 100.0000 mg | ORAL_TABLET | Freq: Two times a day (BID) | ORAL | 0 refills | Status: DC

## 2022-07-06 MED ORDER — BENZONATATE 100 MG PO CAPS: 100.0000 mg | ORAL_CAPSULE | Freq: Three times a day (TID) | ORAL | 0 refills | Status: DC | PRN

## 2022-07-06 NOTE — Progress Notes (Signed)
We are sorry that you are not feeling well.  Here is how we plan to help! ° °Based on your presentation I believe you most likely have A cough due to bacteria.  When patients have a fever and a productive cough with a change in color or increased sputum production, we are concerned about bacterial bronchitis.  If left untreated it can progress to pneumonia.  If your symptoms do not improve with your treatment plan it is important that you contact your provider.   I have prescribed Doxycycline 100 mg twice a day for 7 days   °  °In addition you may use A non-prescription cough medication called Robitussin DAC. Take 2 teaspoons every 8 hours or Delsym: take 2 teaspoons every 12 hours., A non-prescription cough medication called Mucinex DM: take 2 tablets every 12 hours., and A prescription cough medication called Tessalon Perles 100mg. You may take 1-2 capsules every 8 hours as needed for your cough. ° °Prednisone 10 mg daily for 6 days (see taper instructions below) ° °Directions for 6 day taper: °Day 1: 2 tablets before breakfast, 1 after both lunch & dinner and 2 at bedtime °Day 2: 1 tab before breakfast, 1 after both lunch & dinner and 2 at bedtime °Day 3: 1 tab at each meal & 1 at bedtime °Day 4: 1 tab at breakfast, 1 at lunch, 1 at bedtime °Day 5: 1 tab at breakfast & 1 tab at bedtime °Day 6: 1 tab at breakfast ° °From your responses in the eVisit questionnaire you describe inflammation in the upper respiratory tract which is causing a significant cough.  This is commonly called Bronchitis and has four common causes:   °Allergies °Viral Infections °Acid Reflux °Bacterial Infection °Allergies, viruses and acid reflux are treated by controlling symptoms or eliminating the cause. An example might be a cough caused by taking certain blood pressure medications. You stop the cough by changing the medication. Another example might be a cough caused by acid reflux. Controlling the reflux helps control the cough. ° °USE  OF BRONCHODILATOR ("RESCUE") INHALERS: °There is a risk from using your bronchodilator too frequently.  The risk is that over-reliance on a medication which only relaxes the muscles surrounding the breathing tubes can reduce the effectiveness of medications prescribed to reduce swelling and congestion of the tubes themselves.  Although you feel brief relief from the bronchodilator inhaler, your asthma may actually be worsening with the tubes becoming more swollen and filled with mucus.  This can delay other crucial treatments, such as oral steroid medications. If you need to use a bronchodilator inhaler daily, several times per day, you should discuss this with your provider.  There are probably better treatments that could be used to keep your asthma under control.  °   °HOME CARE °Only take medications as instructed by your medical team. °Complete the entire course of an antibiotic. °Drink plenty of fluids and get plenty of rest. °Avoid close contacts especially the very young and the elderly °Cover your mouth if you cough or cough into your sleeve. °Always remember to wash your hands °A steam or ultrasonic humidifier can help congestion.  ° °GET HELP RIGHT AWAY IF: °You develop worsening fever. °You become short of breath °You cough up blood. °Your symptoms persist after you have completed your treatment plan °MAKE SURE YOU  °Understand these instructions. °Will watch your condition. °Will get help right away if you are not doing well or get worse. °  ° °Thank you   for choosing an e-visit. ° °Your e-visit answers were reviewed by a board certified advanced clinical practitioner to complete your personal care plan. Depending upon the condition, your plan could have included both over the counter or prescription medications. ° °Please review your pharmacy choice. Make sure the pharmacy is open so you can pick up prescription now. If there is a problem, you may contact your provider through MyChart messaging and have  the prescription routed to another pharmacy.  Your safety is important to us. If you have drug allergies check your prescription carefully.  ° °For the next 24 hours you can use MyChart to ask questions about today's visit, request a non-urgent call back, or ask for a work or school excuse. °You will get an email in the next two days asking about your experience. I hope that your e-visit has been valuable and will speed your recovery. ° °Approximately 5 minutes was spent documenting and reviewing patient's chart.  ° ° °

## 2022-07-18 DIAGNOSIS — F5081 Binge eating disorder: Secondary | ICD-10-CM | POA: Diagnosis not present

## 2022-07-18 DIAGNOSIS — F419 Anxiety disorder, unspecified: Secondary | ICD-10-CM | POA: Diagnosis not present

## 2022-07-18 DIAGNOSIS — F902 Attention-deficit hyperactivity disorder, combined type: Secondary | ICD-10-CM | POA: Diagnosis not present

## 2022-07-24 ENCOUNTER — Ambulatory Visit: Payer: BC Managed Care – PPO | Admitting: Cardiology

## 2022-08-02 ENCOUNTER — Other Ambulatory Visit: Payer: Self-pay | Admitting: Cardiology

## 2022-08-02 DIAGNOSIS — R931 Abnormal findings on diagnostic imaging of heart and coronary circulation: Secondary | ICD-10-CM

## 2022-08-02 DIAGNOSIS — R7982 Elevated C-reactive protein (CRP): Secondary | ICD-10-CM

## 2022-08-05 ENCOUNTER — Encounter: Payer: Self-pay | Admitting: Cardiology

## 2022-08-05 ENCOUNTER — Ambulatory Visit: Payer: BC Managed Care – PPO | Admitting: Cardiology

## 2022-08-05 VITALS — BP 120/77 | HR 88 | Resp 15 | Ht 61.0 in | Wt 232.0 lb

## 2022-08-05 DIAGNOSIS — R002 Palpitations: Secondary | ICD-10-CM | POA: Diagnosis not present

## 2022-08-05 DIAGNOSIS — R931 Abnormal findings on diagnostic imaging of heart and coronary circulation: Secondary | ICD-10-CM

## 2022-08-05 DIAGNOSIS — I493 Ventricular premature depolarization: Secondary | ICD-10-CM

## 2022-08-05 MED ORDER — METOPROLOL SUCCINATE ER 50 MG PO TB24: 50.0000 mg | ORAL_TABLET | Freq: Every day | ORAL | 3 refills | Status: DC

## 2022-08-05 NOTE — Progress Notes (Signed)
Patient referred by Eulas Post, MD for palpitations  Subjective:   Jessica Silva, female    DOB: May 06, 1986, 36 y.o.   MRN: [id number]   Chief Complaint  Patient presents with   Palpitations   Follow-up    1 year     HPI  36 y.o. Caucasian female with h/o premature ovarian failure at age 55, h/o hashimoto thyroiditis, and Grave's disease s/p radioiodine ablation, obesity, elevated calcium score, symptomatic PVC  Patient I now on Vyvanse. She is unsure if this correlates with her symptoms, but she has noticed palpitations, and occasional lightheadedness. She is currently on metoprolol succinate 25 mg daily.   Initial consultation visit 2021:  Patient is a therapist, has her own practice. Her physical activity has been limited due to work stress. She is trying weight loss programs. She has had intermittent episodes of palpitations, lasting for a few min, not associated with chest pain, shortness of breath, presyncope or syncope.  During her thyroid toxicity.  With Graves' disease, she had resting tachycardia, but did not have any other tachyarrhythmias.  Patient recently underwent CRP testing through her weight loss program, it was mildly elevated at 8.  She had recently had her Covid booster few days prior, and also has a tooth that she has undergone root canal for without crown placement.    Current Outpatient Medications:    acetaminophen (TYLENOL) 325 MG tablet, Take 325 mg by mouth every 6 (six) hours as needed for headache., Disp: , Rfl:    albuterol (VENTOLIN HFA) 108 (90 Base) MCG/ACT inhaler, Inhale 2 puffs into the lungs every 6 (six) hours as needed for wheezing or shortness of breath., Disp: 8 g, Rfl: 0   benzonatate (TESSALON PERLES) 100 MG capsule, Take 1 capsule (100 mg total) by mouth 3 (three) times daily as needed., Disp: 20 capsule, Rfl: 0   buPROPion (WELLBUTRIN XL) 300 MG 24 hr tablet, Take 1 tablet by mouth daily., Disp: 90 tablet, Rfl: 0   cephALEXin  (KEFLEX) 500 MG capsule, Take 1 capsule (500 mg total) by mouth 2 (two) times daily., Disp: 20 capsule, Rfl: 0   cetirizine (ZYRTEC) 10 MG tablet, Take 1 tablet (10 mg total) by mouth daily., Disp: 30 tablet, Rfl: 11   doxycycline (VIBRA-TABS) 100 MG tablet, Take 1 tablet (100 mg total) by mouth 2 (two) times daily., Disp: 20 tablet, Rfl: 0   fluticasone (FLONASE) 50 MCG/ACT nasal spray, Place 2 sprays into both nostrils daily., Disp: 16 g, Rfl: 6   ibuprofen (ADVIL,MOTRIN) 800 MG tablet, Take 1 tablet (800 mg total) by mouth every 8 (eight) hours as needed., Disp: 45 tablet, Rfl: 1   levothyroxine (SYNTHROID) 175 MCG tablet, Take 175 mcg by mouth daily., Disp: , Rfl:    metoprolol succinate (TOPROL-XL) 25 MG 24 hr tablet, Take 1 tablet (25 mg total) by mouth daily. Take with or immediately following a meal., Disp: 90 tablet, Rfl: 3   MOUNJARO 7.5 MG/0.5ML Pen, SMARTSIG:7.5 Milligram(s) SUB-Q Once a Week, Disp: , Rfl:    Multiple Vitamins-Minerals (MULTIVITAMIN ADULTS PO), Take 1 tablet by mouth daily., Disp: , Rfl:    predniSONE (STERAPRED UNI-PAK 21 TAB) 10 MG (21) TBPK tablet, Use as directed, Disp: 21 tablet, Rfl: 0   Probiotic Product (PROBIOTIC-10 PO), Take 1 tablet by mouth daily., Disp: , Rfl:    rosuvastatin (CRESTOR) 10 MG tablet, Take 1 tablet (10 mg total) by mouth daily., Disp: 90 tablet, Rfl: 3   trimethoprim-polymyxin  b (POLYTRIM) ophthalmic solution, Apply 1-2 drops into affected eye QID x 5 days., Disp: 10 mL, Rfl: 0   Vitamin D, Cholecalciferol, 25 MCG (1000 UT) CAPS, Take by mouth., Disp: , Rfl:    Cardiovascular and other pertinent studies:  EKG 08/05/2022: Sinus rhythm 88 bpm Frequent ectopic ventricular beats  Low voltage in precordial leads  Echocardiogram 12/15/2020:  Study Quality: Technically difficult study.  Normal LV systolic function with visual EF 55-60%. Left ventricle cavity  is normal in size. Normal global wall motion. Normal diastolic filling  pattern,  normal LAP. Mild left ventricular hypertrophy.  Mild tricuspid regurgitation. No evidence of pulmonary hypertension.  No prior study for comparison.   Mobile cardiac telemetry 13 days 06/30/2020 - 07/14/2020: Dominant rhythm: Sinus. HR 55-155 bpm. Avg HR 84 bpm. Rare isolated SVE, no couplet/triplets. Rare isolated VE, no couplet/triplets. No atrial fibrillation/atrial flutter/SVT/VT/high grade AV block, sinus pause >3sec noted. 6 patient triggered events, correlate with ventricular ectopy.   CT cardiac scoring 07/2020: Total score: 6 LM: 1 LAD: 4 Lcx: 1 RCA: 0 Minimal calcified coronary artery plaque however this puts the patient over the 90th percentile for age  EKG 06/30/2020: Sinus rhythm 77 bpm  Normal EKG  Recent labs: 05/25/2021: Glucose 87, BUN/Cr 14/1.0. EGFR 73. Na/K 138/4.0. Rest of the CMP normal H/H 14/41. MCV 88. Platelets 212 HbA1C 5.3% TSH 1.7 normal  9/.21/2021: TSH 3.2  02/25/2020: TSH 56 high Free T4 1.2  10/22/2019: Glucose 107, BUN/Cr 16/0.72. EGFR >60. Na/K 141/3.9. Rest of the CMP normal H/H 14/42. MCV 87. Platelets 225 TSH <0.001 low Trop HS <2  05/2019: Chol 164, TG 203, HDL 46, LDL 97    Review of Systems  Cardiovascular:  Positive for palpitations. Negative for chest pain, dyspnea on exertion, leg swelling and syncope.         Vitals:   08/05/22 1136  BP: 120/77  Pulse: 88  Resp: 15  SpO2: 97%     Body mass index is 43.84 kg/m. Filed Weights   08/05/22 1136  Weight: 232 lb (105.2 kg)     Objective:   Physical Exam Vitals and nursing note reviewed.  Constitutional:      General: She is not in acute distress.    Appearance: She is obese.  Neck:     Vascular: No JVD.  Cardiovascular:     Rate and Rhythm: Normal rate and regular rhythm. Occasional Extrasystoles are present.    Heart sounds: Normal heart sounds. No murmur heard. Pulmonary:     Effort: Pulmonary effort is normal.     Breath sounds: Normal breath  sounds. No wheezing or rales.  Musculoskeletal:     Right lower leg: No edema.     Left lower leg: No edema.           Assessment & Recommendations:    36 y.o. Caucasian female with h/o premature ovarian failure at age 23, h/o hashimoto thyroiditis, and Grave's disease s/p radioiodine ablation, obesity, elevated calcium score, symptomatic PVC  Elevated calcium score: Score of 6, 90th percentile. Lipids well controlled. Lipoprotein (a) normal. Continue Crestor 20 mg daily. Should she get pregnant, I recommend holding statin until after lactation is completed.  Symptomatic PVC: Recent increase in symptoms. Increase metoprolol succinate to 50 mg daily. Patient will send my a MyChart message in 4 weeks. If symptoms improved, continue the same. If not, will repeat 1 week cardiac telemetry.   F/u in 1 year   Nigel Mormon, MD Pager:  [phone] Office: [phone]

## 2022-08-15 ENCOUNTER — Other Ambulatory Visit: Payer: Self-pay

## 2022-08-15 DIAGNOSIS — I493 Ventricular premature depolarization: Secondary | ICD-10-CM

## 2022-08-15 MED ORDER — METOPROLOL SUCCINATE ER 50 MG PO TB24: 50.0000 mg | ORAL_TABLET | Freq: Every day | ORAL | 3 refills | Status: DC

## 2022-08-22 DIAGNOSIS — F5081 Binge eating disorder: Secondary | ICD-10-CM | POA: Diagnosis not present

## 2022-08-22 DIAGNOSIS — F902 Attention-deficit hyperactivity disorder, combined type: Secondary | ICD-10-CM | POA: Diagnosis not present

## 2022-08-23 DIAGNOSIS — F411 Generalized anxiety disorder: Secondary | ICD-10-CM | POA: Diagnosis not present

## 2022-08-24 ENCOUNTER — Encounter: Payer: Self-pay | Admitting: Cardiology

## 2022-08-26 ENCOUNTER — Other Ambulatory Visit: Payer: Self-pay | Admitting: Cardiology

## 2022-08-26 DIAGNOSIS — I493 Ventricular premature depolarization: Secondary | ICD-10-CM

## 2022-08-26 NOTE — Telephone Encounter (Signed)
From patient.

## 2022-08-30 DIAGNOSIS — I493 Ventricular premature depolarization: Secondary | ICD-10-CM | POA: Diagnosis not present

## 2022-08-31 ENCOUNTER — Encounter: Payer: Self-pay | Admitting: Cardiology

## 2022-08-31 LAB — BASIC METABOLIC PANEL
BUN/Creatinine Ratio: 15 (ref 9–23)
BUN: 12 mg/dL (ref 6–20)
CO2: 25 mmol/L (ref 20–29)
Calcium: 9 mg/dL (ref 8.7–10.2)
Chloride: 105 mmol/L (ref 96–106)
Creatinine, Ser: 0.79 mg/dL (ref 0.57–1.00)
Glucose: 87 mg/dL (ref 70–99)
Potassium: 4.6 mmol/L (ref 3.5–5.2)
Sodium: 143 mmol/L (ref 134–144)
eGFR: 100 mL/min/{1.73_m2} (ref >59)

## 2022-08-31 LAB — MAGNESIUM: Magnesium: 2.4 mg/dL — ABNORMAL HIGH (ref 1.6–2.3)

## 2022-08-31 LAB — TSH: TSH: 0.627 u[IU]/mL (ref 0.450–4.500)

## 2022-09-04 NOTE — Telephone Encounter (Signed)
From patient.

## 2022-09-10 NOTE — Telephone Encounter (Signed)
We can hold off monitor for now.  Thanks MJP

## 2022-09-11 ENCOUNTER — Telehealth: Payer: BC Managed Care – PPO | Admitting: Family

## 2022-09-11 DIAGNOSIS — U071 COVID-19: Secondary | ICD-10-CM | POA: Diagnosis not present

## 2022-09-11 MED ORDER — BENZONATATE 100 MG PO CAPS: 100.0000 mg | ORAL_CAPSULE | Freq: Three times a day (TID) | ORAL | 0 refills | Status: DC | PRN

## 2022-09-11 MED ORDER — NIRMATRELVIR/RITONAVIR (PAXLOVID)TABLET: 3.0000 | ORAL_TABLET | Freq: Two times a day (BID) | ORAL | 0 refills | Status: AC

## 2022-09-11 NOTE — Progress Notes (Signed)
Virtual Visit Consent   Jessica Silva, you are scheduled for a virtual visit with a [name] provider today. Just as with appointments in the office, your consent must be obtained to participate. Your consent will be active for this visit and any virtual visit you may have with one of our providers in the next 365 days. If you have a MyChart account, a copy of this consent can be sent to you electronically.  As this is a virtual visit, video technology does not allow for your provider to perform a traditional examination. This may limit your provider's ability to fully assess your condition. If your provider identifies any concerns that need to be evaluated in person or the need to arrange testing (such as labs, EKG, etc.), we will make arrangements to do so. Although advances in technology are sophisticated, we cannot ensure that it will always work on either your end or our end. If the connection with a video visit is poor, the visit may have to be switched to a telephone visit. With either a video or telephone visit, we are not always able to ensure that we have a secure connection.  By engaging in this virtual visit, you consent to the provision of healthcare and authorize for your insurance to be billed (if applicable) for the services provided during this visit. Depending on your insurance coverage, you may receive a charge related to this service.  I need to obtain your verbal consent now. Are you willing to proceed with your visit today? [name] has provided verbal consent on 09/11/2022 for a virtual visit (video or telephone). Jessica Dun, FNP  Date: 09/11/2022 10:19 AM  Virtual Visit via Video Note   I, Jessica Silva, connected with  Jessica Silva  ([id number], 08-Feb-1986) on 09/11/22 at 10:30 AM EST by a video-enabled telemedicine application and verified that I am speaking with the correct person using two identifiers.  Location: Patient: Virtual Visit Location Patient:  Home Provider: Virtual Visit Location Provider: Home Office   I discussed the limitations of evaluation and management by telemedicine and the availability of in person appointments. The patient expressed understanding and agreed to proceed.    History of Present Illness: Jessica Silva is a 37 y.o. who identifies as a female who was assigned female at birth, and is being seen today for [place]. Reports her symptoms started yesterday and tested positive yesterday.   HPI: URI  This is a new problem. The current episode started yesterday. The problem has been unchanged. Maximum temperature: sweating. Associated symptoms include congestion, coughing, ear pain, headaches, rhinorrhea, sinus pain, sneezing and a sore throat. Pertinent negatives include no joint pain or neck pain. She has tried acetaminophen for the symptoms. The treatment provided mild relief.    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   Depression, recurrent ([place]) 07/17/2015   Weight gain 07/14/2013    Allergies:  Allergies  Allergen Reactions   Bee Venom Hives   Penicillins Hives    Has patient had  a PCN reaction causing immediate rash, facial/tongue/throat swelling, SOB or lightheadedness with hypotension: unknown Has patient had a PCN reaction causing severe rash involving mucus membranes or skin necrosis: unknown Has patient had a PCN reaction that required hospitalization no Has patient had a PCN reaction occurring within the last 10 years: no If all  of the above answers are "NO", then may proceed with Cephalosporin use.    Medications:  Current Outpatient Medications:    benzonatate (TESSALON PERLES) 100 MG capsule, Take 1 capsule (100 mg total) by mouth 3 (three) times daily as needed., Disp: 20 capsule, Rfl: 0   nirmatrelvir/ritonavir (PAXLOVID) 20 x 150 MG & 10 x 100MG  TABS, Take 3 tablets by mouth 2 (two) times daily for 5 days., Disp: 30 tablet, Rfl: 0   Levothyroxine Sodium 150 MCG CAPS, Take 150 mcg by mouth daily., Disp: , Rfl:    lisdexamfetamine (VYVANSE) 30 MG capsule, Take 30 mg by mouth every morning., Disp: , Rfl:    metoprolol succinate (TOPROL-XL) 50 MG 24 hr tablet, Take 1 tablet (50 mg total) by mouth daily. Take with or immediately following a meal., Disp: 90 tablet, Rfl: 3   MOUNJARO 7.5 MG/0.5ML Pen, SMARTSIG:7.5 Milligram(s) SUB-Q Once a Week, Disp: , Rfl:    rosuvastatin (CRESTOR) 10 MG tablet, TAKE 1 TABLET BY MOUTH EVERY DAY, Disp: 90 tablet, Rfl: 3  Observations/Objective: Patient is well-developed, well-nourished in no acute distress.  Resting comfortably  at home.  Head is normocephalic, atraumatic.  No labored breathing.  Speech is clear and coherent with logical content.  Patient is alert and oriented at baseline.    Assessment and Plan: 1. COVID-19 - nirmatrelvir/ritonavir (PAXLOVID) 20 x 150 MG & 10 x 100MG  TABS; Take 3 tablets by mouth 2 (two) times daily for 5 days.  Dispense: 30 tablet; Refill: 0 - benzonatate (TESSALON PERLES) 100 MG capsule; Take 1 capsule (100 mg total) by mouth 3 (three) times daily as needed.  Dispense: 20 capsule; Refill: 0  COVID positive, rest, force fluids, tylenol as needed, Quarantine for at least 5 days and you are fever free, then must wear a mask out in public from day 6-59, report any worsening symptoms such as increased shortness of breath, swelling, or continued high fevers. Possible adverse effects discussed with antivirals.    Follow Up Instructions: I  discussed the assessment and treatment plan with the patient. The patient was provided an opportunity to ask questions and all were answered. The patient agreed with the plan and demonstrated an understanding of the instructions.  A copy of instructions were sent to the patient via MyChart unless otherwise noted below.     The patient was advised to call back or seek an in-person evaluation if the symptoms worsen or if the condition fails to improve as anticipated.  Time:  I spent 6 minutes with the patient via telehealth technology discussing the above problems/concerns.    Jessica Dun, FNP

## 2022-09-20 DIAGNOSIS — F411 Generalized anxiety disorder: Secondary | ICD-10-CM | POA: Diagnosis not present

## 2022-09-28 ENCOUNTER — Telehealth: Payer: BC Managed Care – PPO | Admitting: Nurse Practitioner

## 2022-09-28 DIAGNOSIS — Z20828 Contact with and (suspected) exposure to other viral communicable diseases: Secondary | ICD-10-CM

## 2022-09-28 MED ORDER — OSELTAMIVIR PHOSPHATE 75 MG PO CAPS: 75.0000 mg | ORAL_CAPSULE | Freq: Every day | ORAL | 0 refills | Status: DC

## 2022-09-28 NOTE — Progress Notes (Signed)
E visit for Flu like symptoms   We are sorry that you are not feeling well.  Here is how we plan to help! Based on what you have shared with me it looks like you may have possible exposure to a virus that causes influenza.  Influenza or "the flu" is   an infection caused by a respiratory virus. The flu virus is highly contagious and persons who did not receive their yearly flu vaccination may "catch" the flu from close contact.  We have anti-viral medications to treat the viruses that cause this infection. They are not a "cure" and only shorten the course of the infection. These prescriptions are most effective when they are given within the first 2 days of "flu" symptoms. Antiviral medication are indicated if you have a high risk of complications from the flu. You should  also consider an antiviral medication if you are in close contact with someone who is at risk. These medications can help patients avoid complications from the flu  but have side effects that you should know. Possible side effects from Tamiflu or oseltamivir include nausea, vomiting, diarrhea, dizziness, headaches, eye redness, sleep problems or other respiratory symptoms. You should not take Tamiflu if you have an allergy to oseltamivir or any to the ingredients in Tamiflu.  Based upon your symptoms and potential risk factors I have prescribed Oseltamivir (Tamiflu).  It has been sent to your designated pharmacy.  You will take one 75 mg capsule orally twice a day for the next 5 days.  If you have just been exposed but no symptoms then take tamiflu 1 tabelt daily for 10 days to prevent flu.  ANYONE WHO HAS FLU SYMPTOMS SHOULD: Stay home. The flu is highly contagious and going out or to work exposes others! Be sure to drink plenty of fluids. Water is fine as well as fruit juices, sodas and electrolyte beverages. You may want to stay away from caffeine or alcohol. If you are nauseated, try taking small sips of liquids. How do you  know if you are getting enough fluid? Your urine should be a pale yellow or almost colorless. Get rest. Taking a steamy shower or using a humidifier may help nasal congestion and ease sore throat pain. Using a saline nasal spray works much the same way. Cough drops, hard candies and sore throat lozenges may ease your cough. Line up a caregiver. Have someone check on you regularly.   GET HELP RIGHT AWAY IF: You cannot keep down liquids or your medications. You become short of breath Your fell like you are going to pass out or loose consciousness. Your symptoms persist after you have completed your treatment plan MAKE SURE YOU  Understand these instructions. Will watch your condition. Will get help right away if you are not doing well or get worse.  Your e-visit answers were reviewed by a board certified advanced clinical practitioner to complete your personal care plan.  Depending on the condition, your plan could have included both over the counter or prescription medications.  If there is a problem please reply  once you have received a response from your provider.  Your safety is important to [place].  If you have drug allergies check your prescription carefully.    You can use MyChart to ask questions about today's visit, request a non-urgent call back, or ask for a work or school excuse for 24 hours related to this e-Visit. If it has been greater than 24 hours you will need to  follow up with your provider, or enter a new e-Visit to address those concerns.  You will get an e-mail in the next two days asking about your experience.  I hope that your e-visit has been valuable and will speed your recovery. Thank you for using e-visits.   Mary-Margaret Hassell Done, FNP   5-10 minutes spent reviewing and documenting in chart.

## 2022-10-03 DIAGNOSIS — F419 Anxiety disorder, unspecified: Secondary | ICD-10-CM | POA: Diagnosis not present

## 2022-10-03 DIAGNOSIS — F902 Attention-deficit hyperactivity disorder, combined type: Secondary | ICD-10-CM | POA: Diagnosis not present

## 2022-10-10 ENCOUNTER — Telehealth: Payer: BC Managed Care – PPO | Admitting: Physician Assistant

## 2022-10-10 DIAGNOSIS — L719 Rosacea, unspecified: Secondary | ICD-10-CM

## 2022-10-10 MED ORDER — METRONIDAZOLE 1 % EX GEL: Freq: Every day | CUTANEOUS | 0 refills | Status: DC

## 2022-10-10 NOTE — Progress Notes (Signed)
Message sent to patient requesting further input regarding current symptoms. Awaiting patient response.  

## 2022-10-10 NOTE — Progress Notes (Signed)
E visit for Rosacea We are sorry that you are not feeling well. Here is how we plan to help! Based on what you shared with me it looks like you have Rosacea.  Rosacea is a common chronic skin condition that usually only affects the face and eyes.  Occasionally, the neck, chest, or other areas may be involved.  Characterized by redness, pimples, and broken blood vessels, rosacea tends to begin after middle age (between the ages of 35 and 54).  It is more common in fair-skinned people and women in menopause. It may appear differently in dark skinned people but rosacea effects all ethnic groups.  The cause of rosacea is not fully understood. We do know that rosacea is worsened by various trigger factors including, spicy or hot foods, hot beverages such as coffee or tea, alcohol, and sun exposure just to name a few.  Signs of rosacea may vary greatly from person to person. In some individuals it may only flare up from time to time.   I have prescribed: A topical antibiotic called Metronidazole 1%.  Apply a thin film to affected area once daily.  Wash your hands before and after use. Make sure your skin is clean and dry.  Rub in gently and completely over the affected areas.  I want you to schedule an in-person follow-up with your PCP for further assessment.    HOME CARE: Keep a record of triggers, such as stress, weather, or certain foods or drinks. Consider limiting hot or spicy foods and alcohol. Always use sunscreen that protects against UVA and UVB rays and has a sun-protecting factor (SPF) of 15 or higher. Avoid putting steroids on the skin sores. Steroids may make rosacea worse.  You may use small amounts of water based cosmetic while using this medication.  Apply cosmetics after cream has dried. If you shave your face, use an electric razor Don't scrub your skin or use sponges, brushes, or other abrasive tools. Doing so can irritate your skin. GET HELP RIGHT AWAY IF: If your rosacea gets  worse or is not better within 4 weeks. If a new skin condition or rash develops. Loss of feeling or tingling of treated area Nausea  MAKE SURE YOU   Understand these instructions. Will watch your condition. Will get help right away if you are not doing well or get worse.   Thank you for choosing an e-visit.  Your e-visit answers were reviewed by a board certified advanced clinical practitioner to complete your personal care plan. Depending upon the condition, your plan could have included both over the counter or prescription medications.  Please review your pharmacy choice. Make sure the pharmacy is open so you can pick up prescription now. If there is a problem, you may contact your provider through [employer] and have the prescription routed to another pharmacy.  Your safety is important to [place]. If you have drug allergies check your prescription carefully.   For the next 24 hours you can use MyChart to ask questions about today's visit, request a non-urgent call back, or ask for a work or school excuse. You will get an email in the next two days asking about your experience. I hope that your e-visit has been valuable and will speed your recovery.

## 2022-10-10 NOTE — Progress Notes (Signed)
I have spent 5 minutes in review of e-visit questionnaire, review and updating patient chart, medical decision making and response to patient.   Lynk Marti Cody Steel Kerney, PA-C    

## 2022-10-12 MED ORDER — METRONIDAZOLE 1 % EX GEL: Freq: Every day | CUTANEOUS | 0 refills | Status: DC

## 2022-10-12 NOTE — Addendum Note (Signed)
Addended by: Geryl Rankins on: 10/12/2022 07:28 AM   Modules accepted: Orders

## 2022-10-18 DIAGNOSIS — F411 Generalized anxiety disorder: Secondary | ICD-10-CM | POA: Diagnosis not present

## 2022-10-31 ENCOUNTER — Other Ambulatory Visit (HOSPITAL_BASED_OUTPATIENT_CLINIC_OR_DEPARTMENT_OTHER): Payer: Self-pay

## 2022-10-31 DIAGNOSIS — F419 Anxiety disorder, unspecified: Secondary | ICD-10-CM | POA: Diagnosis not present

## 2022-10-31 DIAGNOSIS — F902 Attention-deficit hyperactivity disorder, combined type: Secondary | ICD-10-CM | POA: Diagnosis not present

## 2022-10-31 MED ORDER — LISDEXAMFETAMINE DIMESYLATE 30 MG PO CAPS
30.0000 mg | ORAL_CAPSULE | Freq: Every morning | ORAL | 0 refills | Status: DC
  Filled 2022-10-31: qty 30, 30d supply, fill #0

## 2022-11-01 ENCOUNTER — Other Ambulatory Visit (HOSPITAL_BASED_OUTPATIENT_CLINIC_OR_DEPARTMENT_OTHER): Payer: Self-pay

## 2022-11-01 DIAGNOSIS — E785 Hyperlipidemia, unspecified: Secondary | ICD-10-CM | POA: Diagnosis not present

## 2022-11-01 DIAGNOSIS — E288 Other ovarian dysfunction: Secondary | ICD-10-CM | POA: Diagnosis not present

## 2022-11-01 DIAGNOSIS — E669 Obesity, unspecified: Secondary | ICD-10-CM | POA: Diagnosis not present

## 2022-11-01 DIAGNOSIS — E890 Postprocedural hypothyroidism: Secondary | ICD-10-CM | POA: Diagnosis not present

## 2022-11-01 MED ORDER — MOUNJARO 15 MG/0.5ML ~~LOC~~ SOAJ
15.0000 mg | SUBCUTANEOUS | 0 refills | Status: DC
  Filled 2022-11-01 – 2022-11-05 (×4): qty 2, 28d supply, fill #0

## 2022-11-02 ENCOUNTER — Other Ambulatory Visit: Payer: Self-pay | Admitting: Cardiology

## 2022-11-02 DIAGNOSIS — R7982 Elevated C-reactive protein (CRP): Secondary | ICD-10-CM

## 2022-11-02 DIAGNOSIS — R931 Abnormal findings on diagnostic imaging of heart and coronary circulation: Secondary | ICD-10-CM

## 2022-11-05 ENCOUNTER — Other Ambulatory Visit: Payer: Self-pay

## 2022-11-05 ENCOUNTER — Other Ambulatory Visit (HOSPITAL_BASED_OUTPATIENT_CLINIC_OR_DEPARTMENT_OTHER): Payer: Self-pay

## 2022-11-05 ENCOUNTER — Other Ambulatory Visit (HOSPITAL_COMMUNITY): Payer: Self-pay

## 2022-11-05 DIAGNOSIS — R7982 Elevated C-reactive protein (CRP): Secondary | ICD-10-CM

## 2022-11-05 DIAGNOSIS — R931 Abnormal findings on diagnostic imaging of heart and coronary circulation: Secondary | ICD-10-CM

## 2022-11-05 DIAGNOSIS — I493 Ventricular premature depolarization: Secondary | ICD-10-CM

## 2022-11-05 MED ORDER — METOPROLOL SUCCINATE ER 50 MG PO TB24: 50.0000 mg | ORAL_TABLET | Freq: Every day | ORAL | 3 refills | Status: DC

## 2022-11-05 MED ORDER — ROSUVASTATIN CALCIUM 10 MG PO TABS: ORAL_TABLET | ORAL | 2 refills | Status: DC

## 2022-11-08 DIAGNOSIS — E288 Other ovarian dysfunction: Secondary | ICD-10-CM | POA: Diagnosis not present

## 2022-11-08 DIAGNOSIS — E890 Postprocedural hypothyroidism: Secondary | ICD-10-CM | POA: Diagnosis not present

## 2022-11-08 DIAGNOSIS — E785 Hyperlipidemia, unspecified: Secondary | ICD-10-CM | POA: Diagnosis not present

## 2022-11-15 DIAGNOSIS — F411 Generalized anxiety disorder: Secondary | ICD-10-CM | POA: Diagnosis not present

## 2022-11-23 ENCOUNTER — Other Ambulatory Visit (HOSPITAL_COMMUNITY): Payer: Self-pay

## 2022-11-26 IMAGING — US US ABDOMEN LIMITED
2 series · 14 of 22 positions shown · non-contrast
Comparison: None.

CLINICAL DATA: Abdominal pain

EXAM:
ULTRASOUND ABDOMEN LIMITED RIGHT UPPER QUADRANT

[Series 1: us abdomen limited · 13 of 21 slices shown (1 of 2)]
[im 1/21]
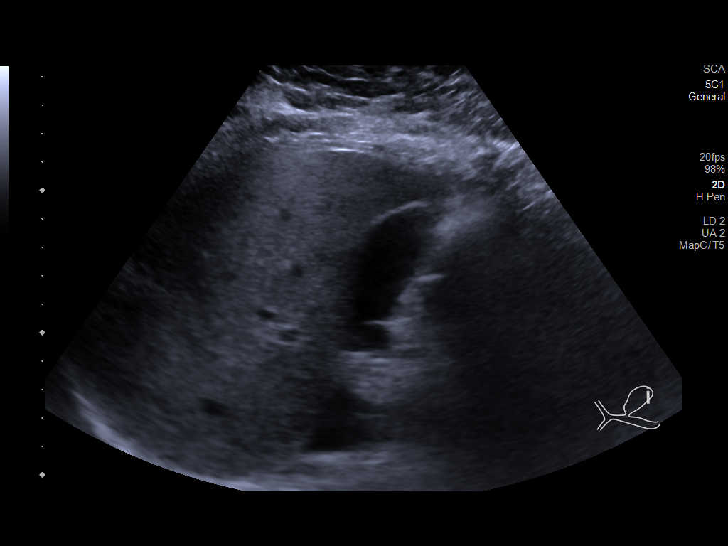
[im 3/21]
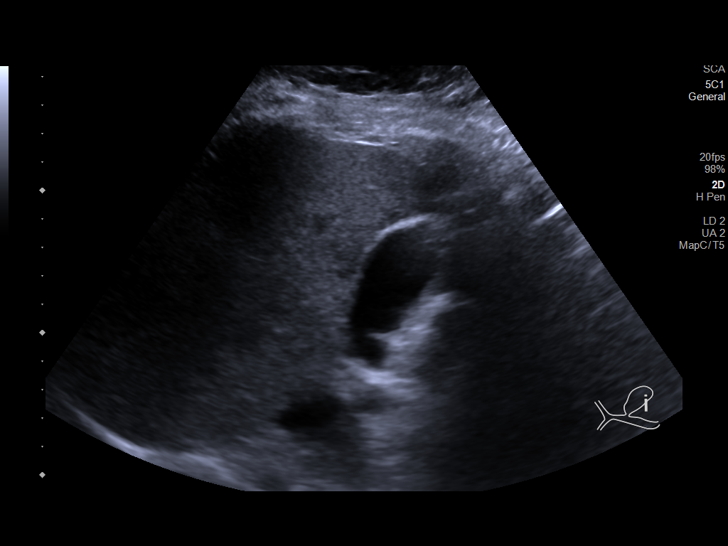
[im 4/21]
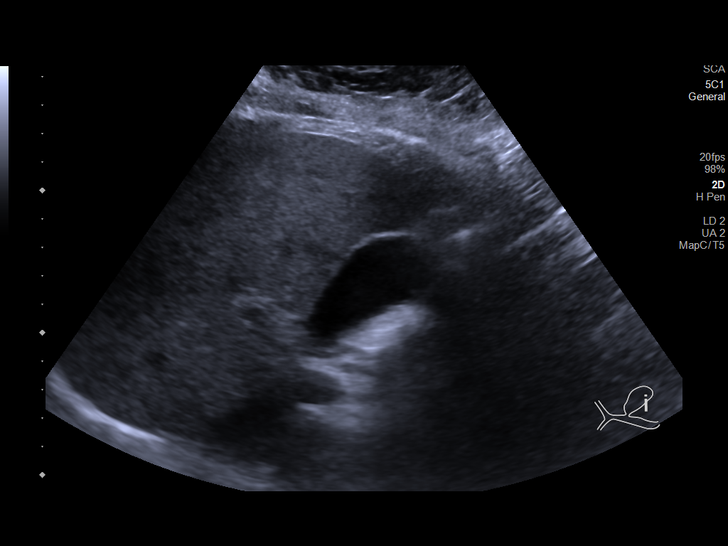
[im 6/21]
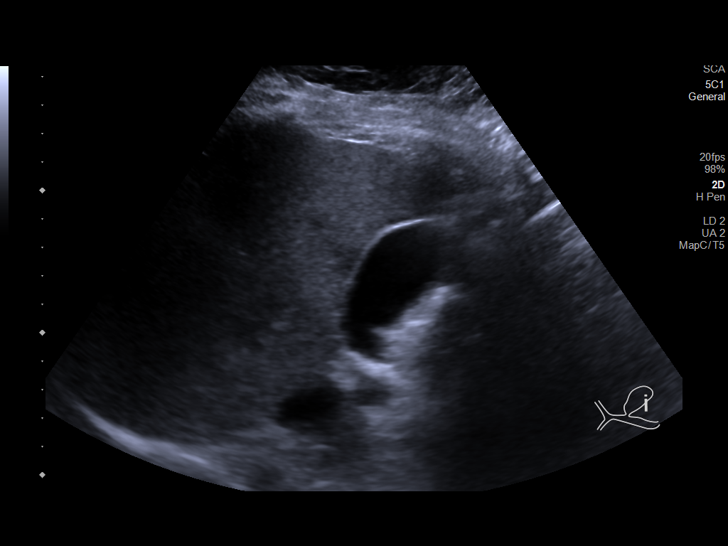
[im 8/21]
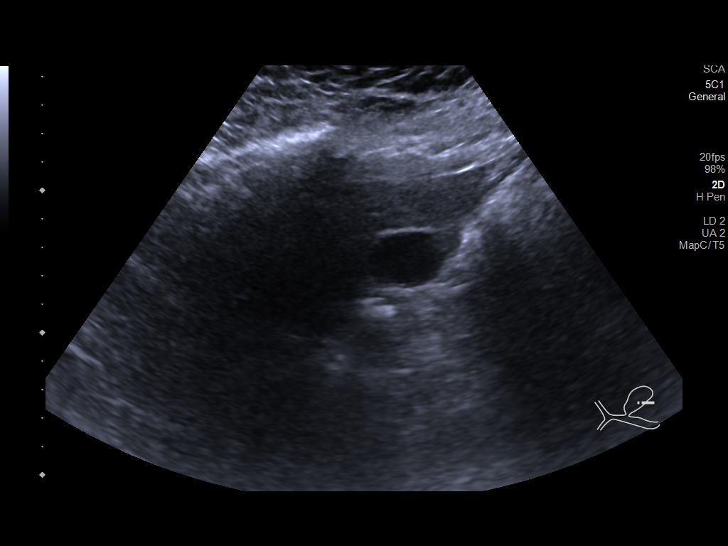
[im 9/21]
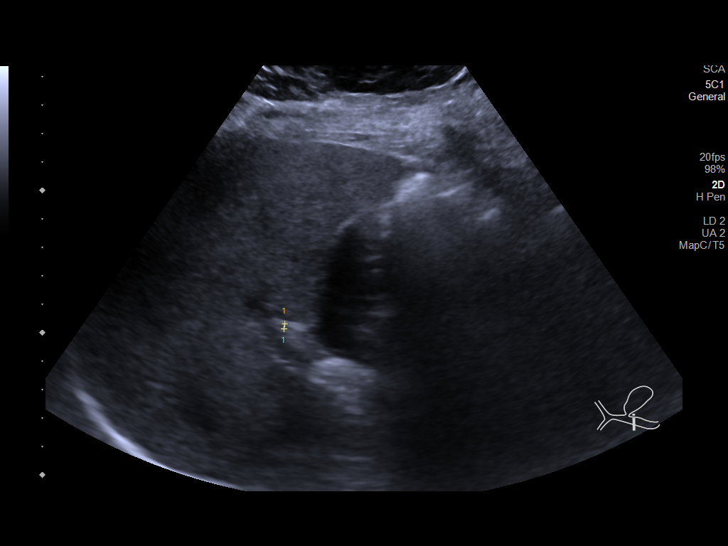
[im 11/21]
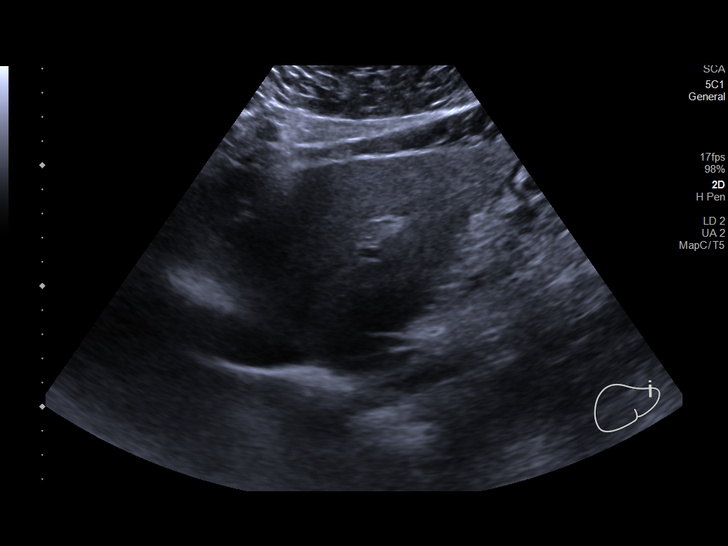
[im 12/21]
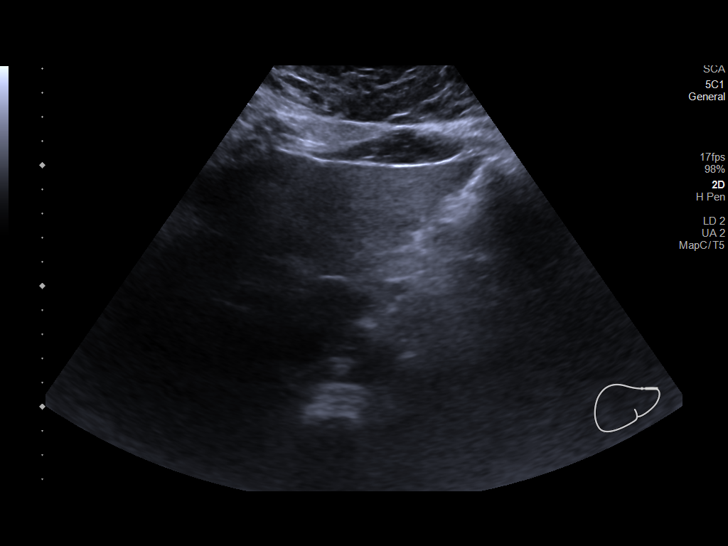
[im 14/21]
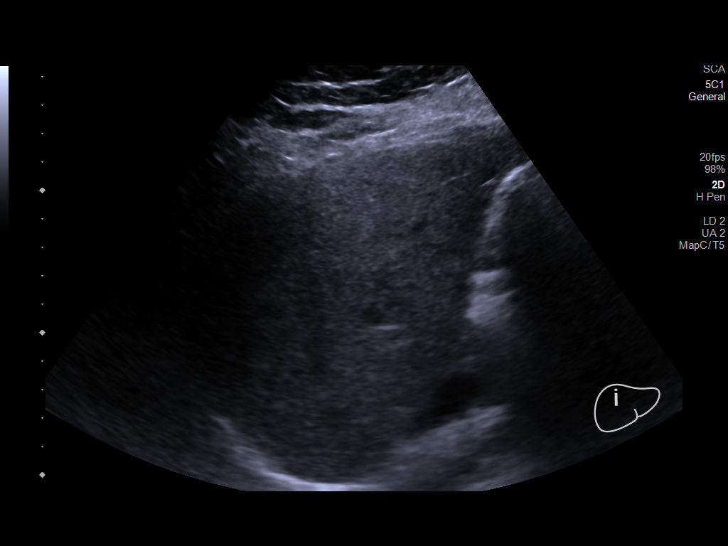
[im 15/21]
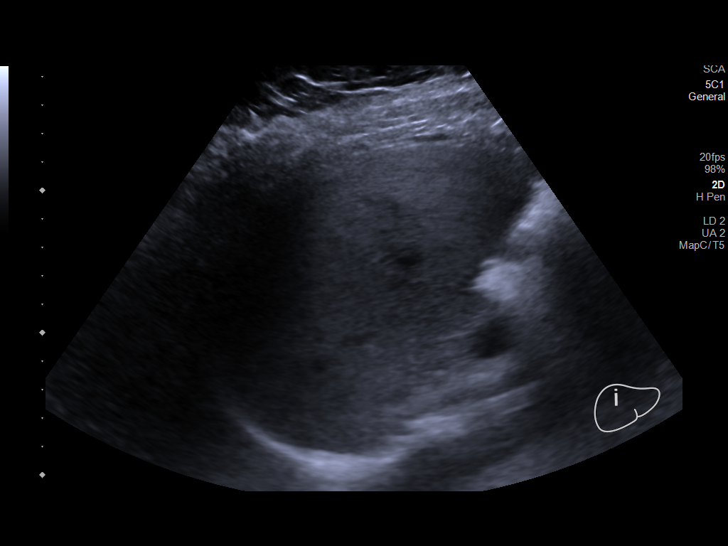
[im 17/21]
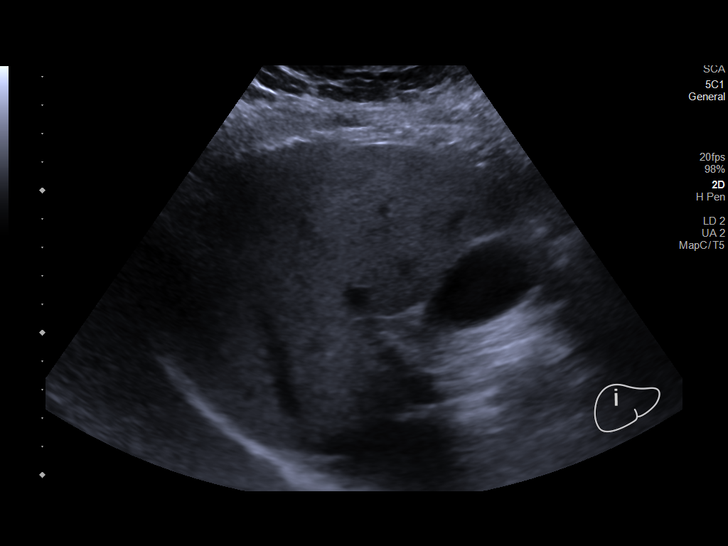
[im 19/21]
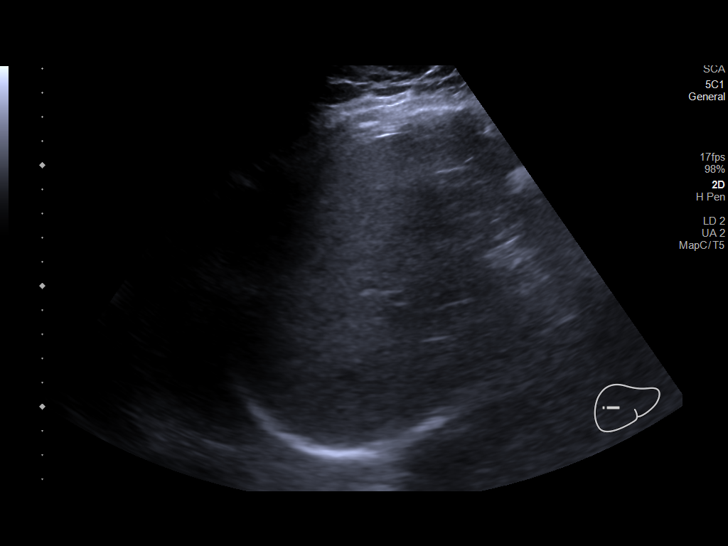
[im 20/21]
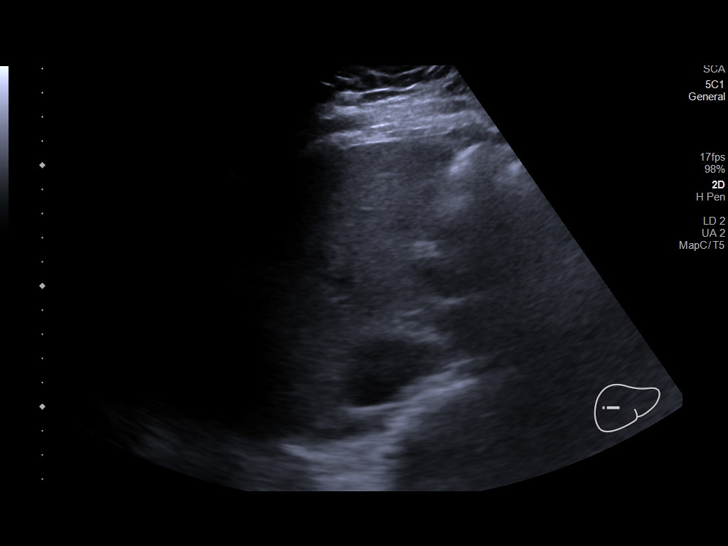

[Series 1001: us abdomen limited · 1 of 1 slices shown (2 of 2)]
[im 1/1]
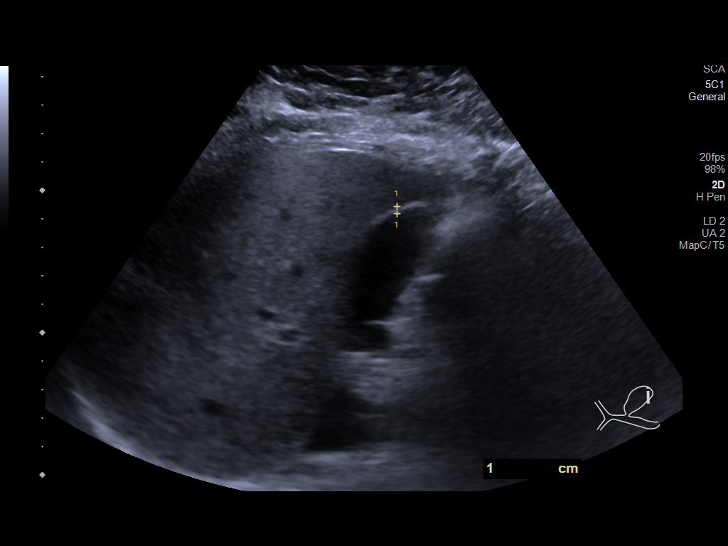

[14 of 22 positions shown; findings below may reference images not displayed]

FINDINGS: Gallbladder:

No gallstones or wall thickening visualized. No sonographic Murphy
sign noted by sonographer.

Common bile duct:

Diameter: 2 mm, normal

Liver:

No focal lesion identified. Increased parenchymal echogenicity.
Portal vein is patent on color Doppler imaging with normal direction
of blood flow towards the liver.

Other: None.
IMPRESSION: Increased liver echogenicity likely reflects steatosis. Gallbladder
is unremarkable.

## 2022-12-01 ENCOUNTER — Other Ambulatory Visit (HOSPITAL_BASED_OUTPATIENT_CLINIC_OR_DEPARTMENT_OTHER): Payer: Self-pay

## 2022-12-01 MED ORDER — MOUNJARO 15 MG/0.5ML ~~LOC~~ SOAJ
15.0000 mg | SUBCUTANEOUS | 0 refills | Status: DC
  Filled 2022-12-01 – 2023-04-16 (×5): qty 2, 28d supply, fill #0

## 2022-12-02 ENCOUNTER — Other Ambulatory Visit: Payer: Self-pay

## 2022-12-02 ENCOUNTER — Encounter (HOSPITAL_BASED_OUTPATIENT_CLINIC_OR_DEPARTMENT_OTHER): Payer: Self-pay | Admitting: Pharmacist

## 2022-12-02 ENCOUNTER — Other Ambulatory Visit (HOSPITAL_COMMUNITY): Payer: Self-pay

## 2022-12-02 ENCOUNTER — Other Ambulatory Visit (HOSPITAL_BASED_OUTPATIENT_CLINIC_OR_DEPARTMENT_OTHER): Payer: Self-pay

## 2022-12-02 MED ORDER — LISDEXAMFETAMINE DIMESYLATE 30 MG PO CAPS
30.0000 mg | ORAL_CAPSULE | Freq: Every morning | ORAL | 0 refills | Status: DC
  Filled 2022-12-02: qty 30, 30d supply, fill #0

## 2022-12-05 ENCOUNTER — Other Ambulatory Visit (HOSPITAL_BASED_OUTPATIENT_CLINIC_OR_DEPARTMENT_OTHER): Payer: Self-pay

## 2022-12-05 MED ORDER — MOUNJARO 10 MG/0.5ML ~~LOC~~ SOAJ
SUBCUTANEOUS | 0 refills | Status: DC
  Filled 2022-12-05: qty 2, 28d supply, fill #0

## 2022-12-06 ENCOUNTER — Other Ambulatory Visit (HOSPITAL_COMMUNITY): Payer: Self-pay

## 2022-12-13 DIAGNOSIS — F411 Generalized anxiety disorder: Secondary | ICD-10-CM | POA: Diagnosis not present

## 2022-12-20 DIAGNOSIS — E890 Postprocedural hypothyroidism: Secondary | ICD-10-CM | POA: Diagnosis not present

## 2022-12-23 ENCOUNTER — Other Ambulatory Visit (HOSPITAL_BASED_OUTPATIENT_CLINIC_OR_DEPARTMENT_OTHER): Payer: Self-pay

## 2022-12-30 ENCOUNTER — Other Ambulatory Visit (HOSPITAL_COMMUNITY): Payer: Self-pay

## 2023-01-01 ENCOUNTER — Other Ambulatory Visit (HOSPITAL_BASED_OUTPATIENT_CLINIC_OR_DEPARTMENT_OTHER): Payer: Self-pay

## 2023-01-01 MED ORDER — MOUNJARO 15 MG/0.5ML ~~LOC~~ SOAJ
15.0000 mg | SUBCUTANEOUS | 0 refills | Status: DC
  Filled 2023-01-01 – 2023-01-03 (×2): qty 2, 28d supply, fill #0

## 2023-01-02 ENCOUNTER — Other Ambulatory Visit (HOSPITAL_BASED_OUTPATIENT_CLINIC_OR_DEPARTMENT_OTHER): Payer: Self-pay

## 2023-01-02 DIAGNOSIS — F902 Attention-deficit hyperactivity disorder, combined type: Secondary | ICD-10-CM | POA: Diagnosis not present

## 2023-01-02 DIAGNOSIS — F419 Anxiety disorder, unspecified: Secondary | ICD-10-CM | POA: Diagnosis not present

## 2023-01-02 MED ORDER — LISDEXAMFETAMINE DIMESYLATE 30 MG PO CAPS
30.0000 mg | ORAL_CAPSULE | Freq: Every morning | ORAL | 0 refills | Status: DC
  Filled 2023-01-02: qty 30, 30d supply, fill #0

## 2023-01-03 ENCOUNTER — Other Ambulatory Visit (HOSPITAL_BASED_OUTPATIENT_CLINIC_OR_DEPARTMENT_OTHER): Payer: Self-pay

## 2023-01-04 ENCOUNTER — Other Ambulatory Visit (HOSPITAL_BASED_OUTPATIENT_CLINIC_OR_DEPARTMENT_OTHER): Payer: Self-pay

## 2023-01-07 ENCOUNTER — Other Ambulatory Visit (HOSPITAL_BASED_OUTPATIENT_CLINIC_OR_DEPARTMENT_OTHER): Payer: Self-pay

## 2023-01-07 MED ORDER — MOUNJARO 12.5 MG/0.5ML ~~LOC~~ SOAJ
12.5000 mg | SUBCUTANEOUS | 0 refills | Status: DC
  Filled 2023-01-07 – 2023-01-08 (×2): qty 2, 28d supply, fill #0

## 2023-01-08 ENCOUNTER — Other Ambulatory Visit (HOSPITAL_BASED_OUTPATIENT_CLINIC_OR_DEPARTMENT_OTHER): Payer: Self-pay

## 2023-01-15 ENCOUNTER — Other Ambulatory Visit (HOSPITAL_BASED_OUTPATIENT_CLINIC_OR_DEPARTMENT_OTHER): Payer: Self-pay

## 2023-01-23 ENCOUNTER — Other Ambulatory Visit (HOSPITAL_BASED_OUTPATIENT_CLINIC_OR_DEPARTMENT_OTHER): Payer: Self-pay

## 2023-01-29 ENCOUNTER — Other Ambulatory Visit (HOSPITAL_BASED_OUTPATIENT_CLINIC_OR_DEPARTMENT_OTHER): Payer: Self-pay

## 2023-02-11 ENCOUNTER — Other Ambulatory Visit (HOSPITAL_BASED_OUTPATIENT_CLINIC_OR_DEPARTMENT_OTHER): Payer: Self-pay

## 2023-02-11 MED ORDER — LISDEXAMFETAMINE DIMESYLATE 30 MG PO CAPS
30.0000 mg | ORAL_CAPSULE | Freq: Every morning | ORAL | 0 refills | Status: DC
  Filled 2023-02-11: qty 30, 30d supply, fill #0

## 2023-02-17 DIAGNOSIS — F411 Generalized anxiety disorder: Secondary | ICD-10-CM | POA: Diagnosis not present

## 2023-03-03 ENCOUNTER — Telehealth: Payer: BC Managed Care – PPO | Admitting: Physician Assistant

## 2023-03-03 DIAGNOSIS — J020 Streptococcal pharyngitis: Secondary | ICD-10-CM | POA: Diagnosis not present

## 2023-03-03 MED ORDER — AZITHROMYCIN 250 MG PO TABS: ORAL_TABLET | ORAL | 0 refills | Status: AC

## 2023-03-03 NOTE — Progress Notes (Signed)

## 2023-03-16 ENCOUNTER — Other Ambulatory Visit (HOSPITAL_BASED_OUTPATIENT_CLINIC_OR_DEPARTMENT_OTHER): Payer: Self-pay

## 2023-03-17 ENCOUNTER — Other Ambulatory Visit (HOSPITAL_BASED_OUTPATIENT_CLINIC_OR_DEPARTMENT_OTHER): Payer: Self-pay

## 2023-03-17 DIAGNOSIS — F411 Generalized anxiety disorder: Secondary | ICD-10-CM | POA: Diagnosis not present

## 2023-03-17 MED ORDER — LISDEXAMFETAMINE DIMESYLATE 30 MG PO CAPS
30.0000 mg | ORAL_CAPSULE | Freq: Every morning | ORAL | 0 refills | Status: DC
  Filled 2023-03-17: qty 30, 30d supply, fill #0

## 2023-03-28 ENCOUNTER — Telehealth: Payer: BC Managed Care – PPO | Admitting: Nurse Practitioner

## 2023-03-28 DIAGNOSIS — L089 Local infection of the skin and subcutaneous tissue, unspecified: Secondary | ICD-10-CM

## 2023-03-28 MED ORDER — MUPIROCIN 2 % EX OINT
1.0000 | TOPICAL_OINTMENT | Freq: Two times a day (BID) | CUTANEOUS | 0 refills | Status: DC
Start: 2023-03-28 — End: 2023-08-15

## 2023-03-28 NOTE — Progress Notes (Signed)
E Visit for Rash   Jessica Silva, I am assuming this may be a delayed topical reaction to injection sites.  I am going to prescribe a topical antibiotic ointment and would suggest taking an allergy medication like zyrtec tonight.  If the area becomes dark, if new areas open open up, with fever or spreading of redness I would suggest in person evaluation   Meds ordered this encounter  Medications   mupirocin ointment (BACTROBAN) 2 %    Sig: Apply 1 Application topically 2 (two) times daily.    Dispense:  22 g    Refill:  0       GET HELP RIGHT AWAY IF:  Symptoms don't go away after treatment. Severe itching that persists. If you rash spreads or swells. If you rash begins to smell. If it blisters and opens or develops a yellow-brown crust. You develop a fever. You have a sore throat. You become short of breath.  MAKE SURE YOU:  Understand these instructions. Will watch your condition. Will get help right away if you are not doing well or get worse.  Thank you for choosing an e-visit.  Your e-visit answers were reviewed by a board certified advanced clinical practitioner to complete your personal care plan. Depending upon the condition, your plan could have included both over the counter or prescription medications.  Please review your pharmacy choice. Make sure the pharmacy is open so you can pick up prescription now. If there is a problem, you may contact your provider through [employer] and have the prescription routed to another pharmacy.  Your safety is important to [place]. If you have drug allergies check your prescription carefully.   For the next 24 hours you can use MyChart to ask questions about today's visit, request a non-urgent call back, or ask for a work or school excuse. You will get an email in the next two days asking about your experience. I hope that your e-visit has been valuable and will speed your recovery.   Meds ordered this encounter  Medications    mupirocin ointment (BACTROBAN) 2 %    Sig: Apply 1 Application topically 2 (two) times daily.    Dispense:  22 g    Refill:  0    I spent approximately 5 minutes reviewing the patient's history, current symptoms and coordinating their care today.

## 2023-04-03 ENCOUNTER — Other Ambulatory Visit (HOSPITAL_BASED_OUTPATIENT_CLINIC_OR_DEPARTMENT_OTHER): Payer: Self-pay

## 2023-04-03 DIAGNOSIS — F419 Anxiety disorder, unspecified: Secondary | ICD-10-CM | POA: Diagnosis not present

## 2023-04-03 DIAGNOSIS — F902 Attention-deficit hyperactivity disorder, combined type: Secondary | ICD-10-CM | POA: Diagnosis not present

## 2023-04-03 MED ORDER — GUANFACINE HCL ER 1 MG PO TB24
1.0000 mg | ORAL_TABLET | Freq: Every evening | ORAL | 0 refills | Status: DC
  Filled 2023-04-03: qty 30, 30d supply, fill #0

## 2023-04-03 MED ORDER — LISDEXAMFETAMINE DIMESYLATE 30 MG PO CAPS
30.0000 mg | ORAL_CAPSULE | Freq: Every morning | ORAL | 0 refills | Status: DC
  Filled 2023-04-16: qty 30, 30d supply, fill #0

## 2023-04-14 ENCOUNTER — Other Ambulatory Visit (HOSPITAL_BASED_OUTPATIENT_CLINIC_OR_DEPARTMENT_OTHER): Payer: Self-pay

## 2023-04-14 DIAGNOSIS — F411 Generalized anxiety disorder: Secondary | ICD-10-CM | POA: Diagnosis not present

## 2023-04-16 ENCOUNTER — Other Ambulatory Visit (HOSPITAL_BASED_OUTPATIENT_CLINIC_OR_DEPARTMENT_OTHER): Payer: Self-pay

## 2023-04-16 ENCOUNTER — Other Ambulatory Visit: Payer: Self-pay

## 2023-05-02 ENCOUNTER — Encounter: Payer: Self-pay | Admitting: Family Medicine

## 2023-05-14 ENCOUNTER — Other Ambulatory Visit (HOSPITAL_BASED_OUTPATIENT_CLINIC_OR_DEPARTMENT_OTHER): Payer: Self-pay

## 2023-05-16 DIAGNOSIS — F411 Generalized anxiety disorder: Secondary | ICD-10-CM | POA: Diagnosis not present

## 2023-06-01 ENCOUNTER — Encounter: Payer: Self-pay | Admitting: Cardiology

## 2023-06-02 ENCOUNTER — Other Ambulatory Visit (HOSPITAL_BASED_OUTPATIENT_CLINIC_OR_DEPARTMENT_OTHER): Payer: Self-pay

## 2023-06-02 DIAGNOSIS — E2839 Other primary ovarian failure: Secondary | ICD-10-CM | POA: Diagnosis not present

## 2023-06-02 DIAGNOSIS — Z01419 Encounter for gynecological examination (general) (routine) without abnormal findings: Secondary | ICD-10-CM | POA: Diagnosis not present

## 2023-06-02 DIAGNOSIS — N926 Irregular menstruation, unspecified: Secondary | ICD-10-CM | POA: Diagnosis not present

## 2023-06-02 MED ORDER — LISDEXAMFETAMINE DIMESYLATE 30 MG PO CAPS
30.0000 mg | ORAL_CAPSULE | Freq: Every morning | ORAL | 0 refills | Status: DC
  Filled 2023-06-02: qty 30, 30d supply, fill #0

## 2023-06-09 ENCOUNTER — Other Ambulatory Visit (HOSPITAL_BASED_OUTPATIENT_CLINIC_OR_DEPARTMENT_OTHER): Payer: Self-pay

## 2023-06-09 DIAGNOSIS — F419 Anxiety disorder, unspecified: Secondary | ICD-10-CM | POA: Diagnosis not present

## 2023-06-09 DIAGNOSIS — F902 Attention-deficit hyperactivity disorder, combined type: Secondary | ICD-10-CM | POA: Diagnosis not present

## 2023-06-09 MED ORDER — LISDEXAMFETAMINE DIMESYLATE 30 MG PO CAPS
30.0000 mg | ORAL_CAPSULE | Freq: Every day | ORAL | 0 refills | Status: DC
  Filled 2023-07-06: qty 30, 30d supply, fill #0

## 2023-06-13 DIAGNOSIS — F411 Generalized anxiety disorder: Secondary | ICD-10-CM | POA: Diagnosis not present

## 2023-06-23 ENCOUNTER — Other Ambulatory Visit (HOSPITAL_BASED_OUTPATIENT_CLINIC_OR_DEPARTMENT_OTHER): Payer: Self-pay

## 2023-06-23 MED ORDER — MOUNJARO 15 MG/0.5ML ~~LOC~~ SOAJ
15.0000 mg | SUBCUTANEOUS | 0 refills | Status: DC
  Filled 2023-06-23: qty 2, 28d supply, fill #0

## 2023-07-07 ENCOUNTER — Other Ambulatory Visit (HOSPITAL_BASED_OUTPATIENT_CLINIC_OR_DEPARTMENT_OTHER): Payer: Self-pay

## 2023-07-11 DIAGNOSIS — F411 Generalized anxiety disorder: Secondary | ICD-10-CM | POA: Diagnosis not present

## 2023-07-29 ENCOUNTER — Other Ambulatory Visit (HOSPITAL_BASED_OUTPATIENT_CLINIC_OR_DEPARTMENT_OTHER): Payer: Self-pay

## 2023-07-30 ENCOUNTER — Other Ambulatory Visit (HOSPITAL_BASED_OUTPATIENT_CLINIC_OR_DEPARTMENT_OTHER): Payer: Self-pay

## 2023-07-30 MED ORDER — MOUNJARO 15 MG/0.5ML ~~LOC~~ SOAJ
15.0000 mg | SUBCUTANEOUS | 0 refills | Status: DC
  Filled 2023-07-30: qty 2, 28d supply, fill #0

## 2023-08-01 ENCOUNTER — Encounter: Payer: Self-pay | Admitting: Family Medicine

## 2023-08-01 DIAGNOSIS — F411 Generalized anxiety disorder: Secondary | ICD-10-CM | POA: Diagnosis not present

## 2023-08-05 ENCOUNTER — Other Ambulatory Visit (HOSPITAL_BASED_OUTPATIENT_CLINIC_OR_DEPARTMENT_OTHER): Payer: Self-pay

## 2023-08-06 ENCOUNTER — Other Ambulatory Visit (HOSPITAL_BASED_OUTPATIENT_CLINIC_OR_DEPARTMENT_OTHER): Payer: Self-pay

## 2023-08-06 ENCOUNTER — Ambulatory Visit: Payer: Self-pay | Admitting: Cardiology

## 2023-08-06 MED ORDER — LISDEXAMFETAMINE DIMESYLATE 30 MG PO CAPS
30.0000 mg | ORAL_CAPSULE | Freq: Every morning | ORAL | 0 refills | Status: DC
  Filled 2023-08-06: qty 30, 30d supply, fill #0

## 2023-08-11 ENCOUNTER — Other Ambulatory Visit (HOSPITAL_BASED_OUTPATIENT_CLINIC_OR_DEPARTMENT_OTHER): Payer: Self-pay

## 2023-08-11 ENCOUNTER — Encounter (HOSPITAL_BASED_OUTPATIENT_CLINIC_OR_DEPARTMENT_OTHER): Payer: Self-pay

## 2023-08-14 ENCOUNTER — Other Ambulatory Visit (HOSPITAL_BASED_OUTPATIENT_CLINIC_OR_DEPARTMENT_OTHER): Payer: Self-pay

## 2023-08-15 ENCOUNTER — Other Ambulatory Visit (HOSPITAL_BASED_OUTPATIENT_CLINIC_OR_DEPARTMENT_OTHER): Payer: Self-pay

## 2023-08-15 ENCOUNTER — Encounter: Payer: Self-pay | Admitting: Cardiology

## 2023-08-15 ENCOUNTER — Other Ambulatory Visit: Payer: Self-pay

## 2023-08-15 ENCOUNTER — Ambulatory Visit: Payer: BC Managed Care – PPO | Attending: Cardiology | Admitting: Cardiology

## 2023-08-15 VITALS — BP 112/74 | HR 88 | Resp 16 | Ht 61.0 in | Wt 234.0 lb

## 2023-08-15 DIAGNOSIS — R931 Abnormal findings on diagnostic imaging of heart and coronary circulation: Secondary | ICD-10-CM

## 2023-08-15 DIAGNOSIS — I493 Ventricular premature depolarization: Secondary | ICD-10-CM | POA: Diagnosis not present

## 2023-08-15 DIAGNOSIS — R7982 Elevated C-reactive protein (CRP): Secondary | ICD-10-CM | POA: Diagnosis not present

## 2023-08-15 MED ORDER — METOPROLOL SUCCINATE ER 25 MG PO TB24
25.0000 mg | ORAL_TABLET | Freq: Every day | ORAL | 3 refills | Status: DC
  Filled 2023-08-15: qty 90, 90d supply, fill #0

## 2023-08-15 MED ORDER — ROSUVASTATIN CALCIUM 10 MG PO TABS
10.0000 mg | ORAL_TABLET | Freq: Every day | ORAL | 3 refills | Status: DC
  Filled 2023-08-15: qty 90, 90d supply, fill #0

## 2023-08-15 NOTE — Patient Instructions (Signed)
Medication Instructions:  Please DECREASE your dose of Toprol to 25 mg daily.   *If you need a refill on your cardiac medications before your next appointment, please call your pharmacy*   Lab Work: None.  If you have labs (blood work) drawn today and your tests are completely normal, you will receive your results only by: MyChart Message (if you have MyChart) OR A paper copy in the mail If you have any lab test that is abnormal or we need to change your treatment, we will call you to review the results.   Testing/Procedures: None.   Follow-Up:    Your next appointment:   1 year(s)  Provider:   Dr. Judie Petit. Rosemary Holms, MD

## 2023-08-15 NOTE — Progress Notes (Signed)
  Cardiology Office Note:  .   Date:  08/15/2023  ID:  [name], DOB [birth year], MRN [id number] PCP: Kristian Covey, MD  [facility] Providers Cardiologist:  Truett Mainland, MD PCP: Kristian Covey, MD  Chief Complaint  Patient presents with   Palpitations   Follow-up      History of Present Illness: .    Jessica Silva is a 37 y.o. female with h/o premature ovarian failure at age 44, h/o hashimoto thyroiditis, and Grave's disease s/p radioiodine ablation, obesity, elevated calcium score, symptomatic PVC   Patient is doing well, walking regularly with her new job.  She denies any complaints of chest pain, shortness of breath.  Palpitations and PVCs are well-controlled with metoprolol succinate, that she takes 25 mg daily.  Vitals:   08/15/23 1001  BP: 112/74  Pulse: 88  Resp: 16  SpO2: 99%     ROS:  Review of Systems  Cardiovascular:  Negative for chest pain, dyspnea on exertion, leg swelling, palpitations and syncope.     Studies Reviewed: Marland Kitchen        EKG 08/15/2023: Normal sinus rhythm Low voltage QRS When compared with ECG of 22-Oct-2019 11:16,  rate is slower    Independently interpreted 10/2022: Chol 121, TG 124, HDL 52, LDL 47 HbA1C 5.1% Hb 14.1 Cr 0.7 TSH 1.7   Physical Exam:   Physical Exam Vitals and nursing note reviewed.  Constitutional:      General: She is not in acute distress. Neck:     Vascular: No JVD.  Cardiovascular:     Rate and Rhythm: Normal rate and regular rhythm.     Heart sounds: Normal heart sounds. No murmur heard. Pulmonary:     Effort: Pulmonary effort is normal.     Breath sounds: Normal breath sounds. No wheezing or rales.  Musculoskeletal:     Right lower leg: No edema.     Left lower leg: No edema.      VISIT DIAGNOSES:   ICD-10-CM   1. Elevated coronary artery calcium score  R93.1 EKG 12-Lead    rosuvastatin (CRESTOR) 10 MG tablet    2. PVC (premature ventricular contraction)  I49.3      3. Elevated C-reactive protein (CRP)  R79.82 rosuvastatin (CRESTOR) 10 MG tablet       ASSESSMENT AND PLAN: .    Jessica Silva is a 37 y.o. female with h/o premature ovarian failure at age 5, h/o hashimoto thyroiditis, and Grave's disease s/p radioiodine ablation, obesity, elevated calcium score, symptomatic PVC   Elevated calcium score: Score of 6, 90th percentile. Lipids well controlled. Lipoprotein (a) normal. Continue Crestor 20 mg daily. Should she get pregnant, I recommend holding statin until after lactation is completed.   Symptomatic PVC: Controlled, continue metoprolol succinate 25 mg daily.         No orders of the defined types were placed in this encounter.    F/u in 1 year  Signed, Elder Negus, MD

## 2023-08-18 ENCOUNTER — Ambulatory Visit: Payer: BC Managed Care – PPO | Admitting: Family Medicine

## 2023-08-18 VITALS — BP 128/70 | HR 95 | Temp 98.2°F | Ht 61.0 in | Wt 235.9 lb

## 2023-08-18 DIAGNOSIS — F909 Attention-deficit hyperactivity disorder, unspecified type: Secondary | ICD-10-CM

## 2023-08-18 DIAGNOSIS — F988 Other specified behavioral and emotional disorders with onset usually occurring in childhood and adolescence: Secondary | ICD-10-CM | POA: Insufficient documentation

## 2023-08-18 NOTE — Progress Notes (Signed)
Established Patient Office Visit  Subjective   Patient ID: Jessica Silva, female    DOB: 1986-05-13  Age: 37 y.o. MRN: [id number]  Chief Complaint  Patient presents with   Medication Consultation    /    HPI   Jessica Silva is here regarding attention deficit disorder.  She has previously seen a psychiatrist and no longer has insurance coverage to see that practice.  This would cost her over $300 per visit out-of-pocket.  She was diagnosed after a comprehensive evaluation previously with adult ADD.  Was initially started on Vyvanse 20 mg and currently on 30 mg dose which seems to working very well.  She notices a definite difference when she does not take medication.  She does not take daily-on weekends or days that she does not work.  Denies any adverse side effects.  Other medical problems include history of elevated coronary calcium score for age, history of depression, and obesity.  She is currently treated with Mounjaro 15 mg once weekly.  Initially had some good weight loss but recently has plateaued.  Also takes rosuvastatin 10 mg daily for hyperlipidemia.  She has hypothyroidism and is on replacement and followed per endocrinology  Past Medical History:  Diagnosis Date   Abnormal uterine bleeding    Amenorrhea    Anxiety    Depression    GERD (gastroesophageal reflux disease)    Hashimoto's thyroiditis    Hormone disorder    Infertility, female    PONV (postoperative nausea and vomiting)    Premature ovarian failure 2002   Psychogenic air hunger    Past Surgical History:  Procedure Laterality Date   CESAREAN SECTION N/A 04/19/2018   Procedure: CESAREAN SECTION;  Surgeon: Sherian Rein, MD;  Location: [facility];  Service: Obstetrics;  Laterality: N/A;   DILATATION & CURETTAGE/HYSTEROSCOPY WITH MYOSURE N/A 05/07/2016   Procedure: DILATATION & CURETTAGE/HYSTEROSCOPY WITH MYOSURE;  Surgeon: Romualdo Bolk, MD;  Location: [facility];  Service: Gynecology;   Laterality: N/A;   DILATION AND CURETTAGE OF UTERUS     radioactive iodine treatment     thyroid   TONSILLECTOMY     TONSILLECTOMY AND ADENOIDECTOMY     WISDOM TOOTH EXTRACTION      reports that she quit smoking about 9 years ago. Her smoking use included cigarettes. She started smoking about 10 years ago. She has a 0.1 pack-year smoking history. She has never used smokeless tobacco. She reports current alcohol use. She reports that she does not use drugs. family history includes Atrial fibrillation in her father; Hiatal hernia in her maternal grandmother and mother; Hypertension in her sister; Irritable bowel syndrome in her brother and brother; Polycystic ovary syndrome in her sister. Allergies  Allergen Reactions   Bee Venom Hives   Penicillins Hives    Has patient had a PCN reaction causing immediate rash, facial/tongue/throat swelling, SOB or lightheadedness with hypotension: unknown Has patient had a PCN reaction causing severe rash involving mucus membranes or skin necrosis: unknown Has patient had a PCN reaction that required hospitalization no Has patient had a PCN reaction occurring within the last 10 years: no If all of the above answers are "NO", then may proceed with Cephalosporin use.     Review of Systems  Constitutional:  Negative for malaise/fatigue.  Eyes:  Negative for blurred vision.  Respiratory:  Negative for shortness of breath.   Cardiovascular:  Negative for chest pain.  Neurological:  Negative for dizziness, weakness and headaches.  Objective:     BP 128/70 (BP Location: Left Arm, Patient Position: Sitting, Cuff Size: Large)   Pulse 95   Temp 98.2 F (36.8 C) (Oral)   Ht 5\' 1"  (1.549 m)   Wt 235 lb 14.4 oz (107 kg)   SpO2 97%   BMI 44.57 kg/m  BP Readings from Last 3 Encounters:  08/18/23 128/70  08/15/23 112/74  08/05/22 120/77   Wt Readings from Last 3 Encounters:  08/18/23 235 lb 14.4 oz (107 kg)  08/15/23 234 lb (106.1 kg)  08/05/22  232 lb (105.2 kg)      Physical Exam Vitals reviewed.  Constitutional:      Appearance: Normal appearance.  Cardiovascular:     Rate and Rhythm: Normal rate and regular rhythm.     Heart sounds: No murmur heard.    No gallop.  Pulmonary:     Effort: Pulmonary effort is normal.     Breath sounds: Normal breath sounds.  Neurological:     Mental Status: She is alert.      No results found for any visits on 08/18/23.    The ASCVD Risk score (Arnett DK, et al., 2019) failed to calculate for the following reasons:   The 2019 ASCVD risk score is only valid for ages 59 to 64    Assessment & Plan:   Adult ADD currently stable on Vyvanse 30 mg once daily.  Has previously seen psychiatrist for prescriptions but is requesting transfer to our care.  She is aware that with this being controlled substance needs to be prescribed through 1 practice and 1 provider as much as possible.  We agreed to take over prescribing her Vyvanse.  She does not need refills at this time.  Can refill for up to 3 months at a time.  Recommend 80-month office follow-up to reassess  [name], MD

## 2023-08-25 DIAGNOSIS — Z713 Dietary counseling and surveillance: Secondary | ICD-10-CM | POA: Diagnosis not present

## 2023-08-25 DIAGNOSIS — Z6841 Body Mass Index (BMI) 40.0 and over, adult: Secondary | ICD-10-CM | POA: Diagnosis not present

## 2023-08-26 ENCOUNTER — Other Ambulatory Visit (HOSPITAL_BASED_OUTPATIENT_CLINIC_OR_DEPARTMENT_OTHER): Payer: Self-pay

## 2023-08-26 IMAGING — DX DG SHOULDER 2+V*R*
3 series · 3 of 3 positions shown · non-contrast
Comparison: Chest radiograph dated 10/22/2019.

CLINICAL DATA: Right shoulder pain.

EXAM:
RIGHT SHOULDER - 2+ VIEW

[shoulder ap (1 of 2)]
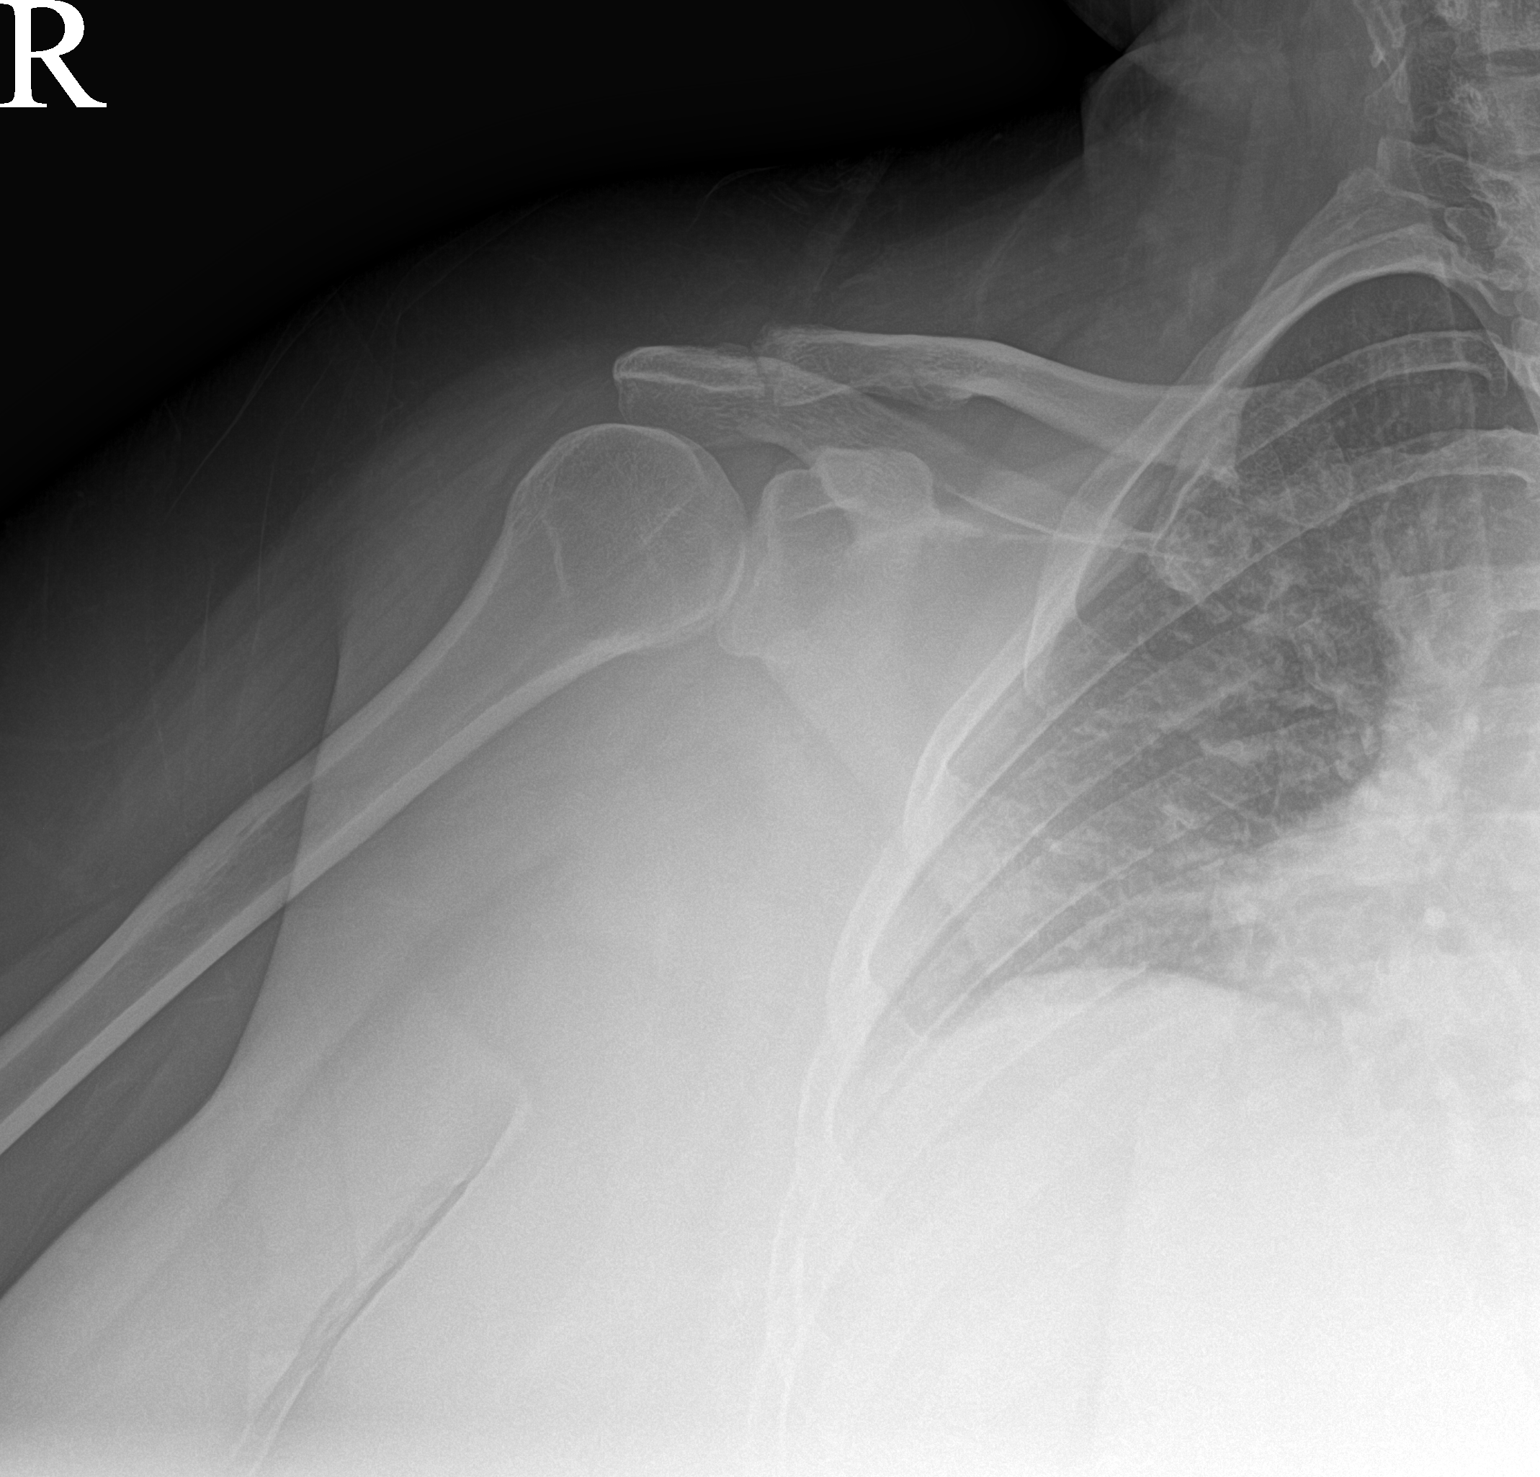

[shoulder ap (2 of 2)]
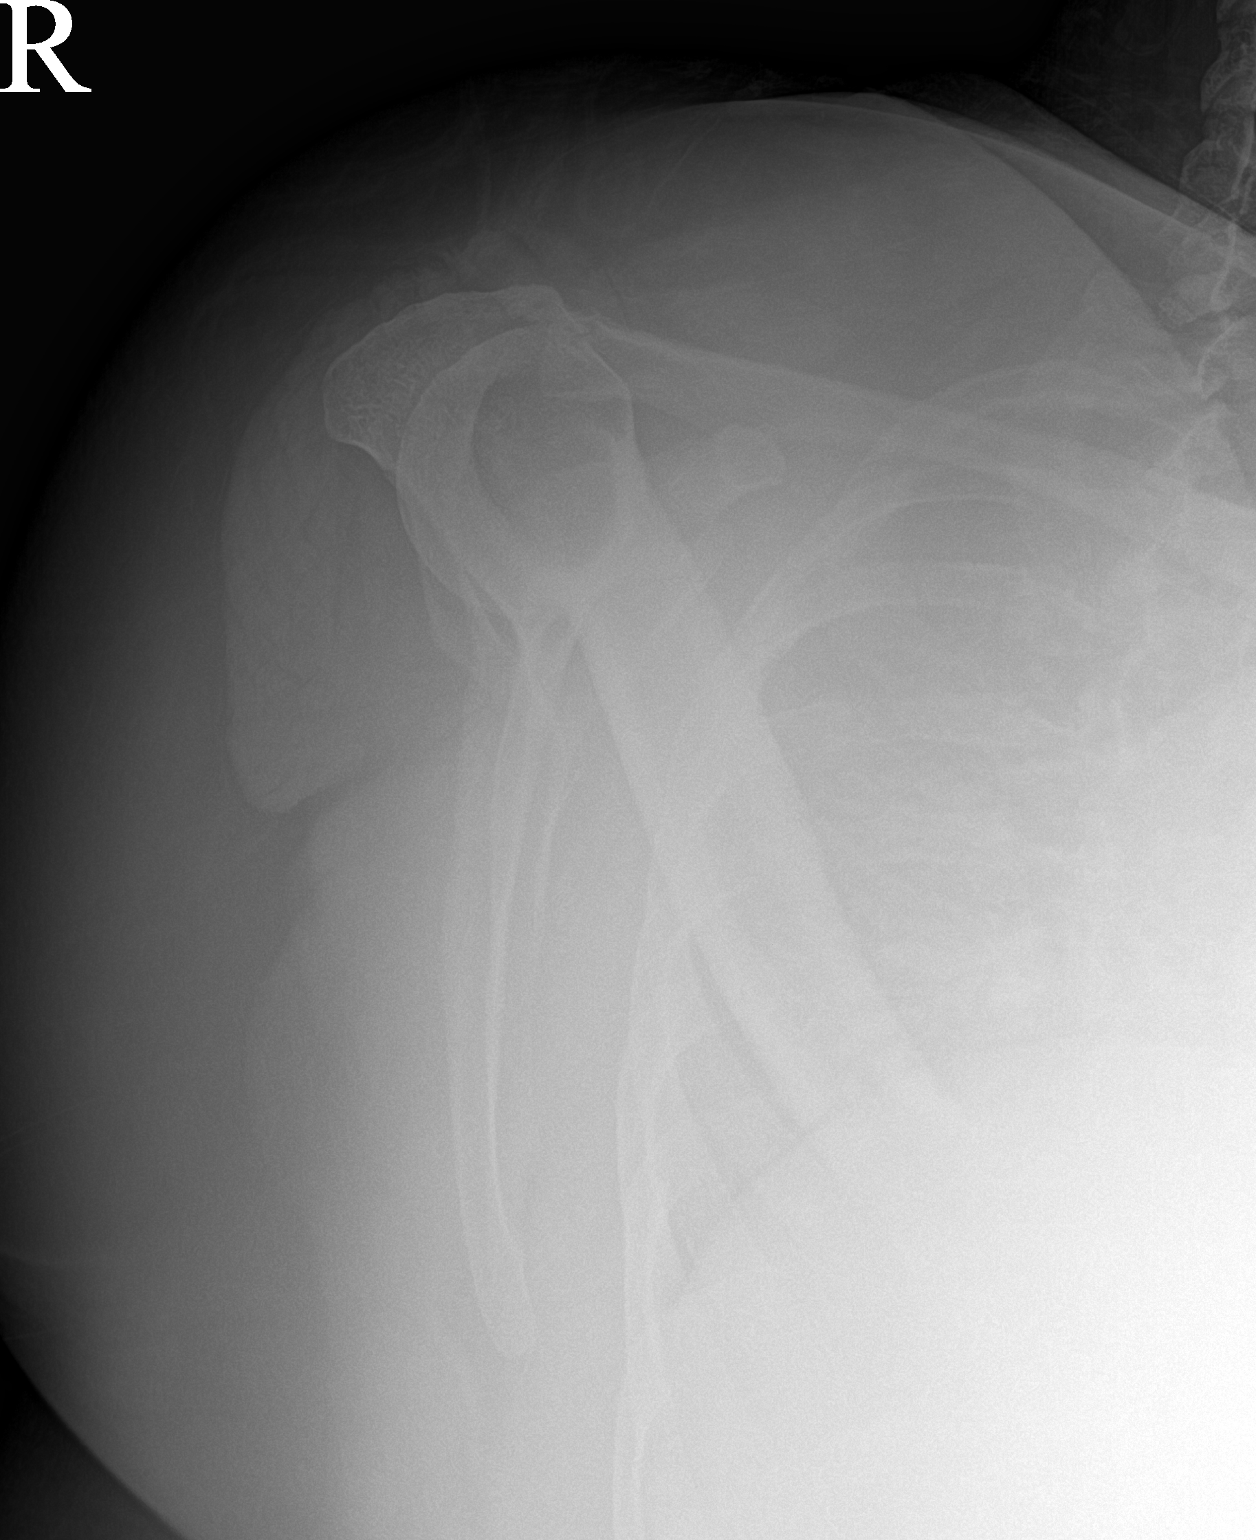

[shoulder axial]
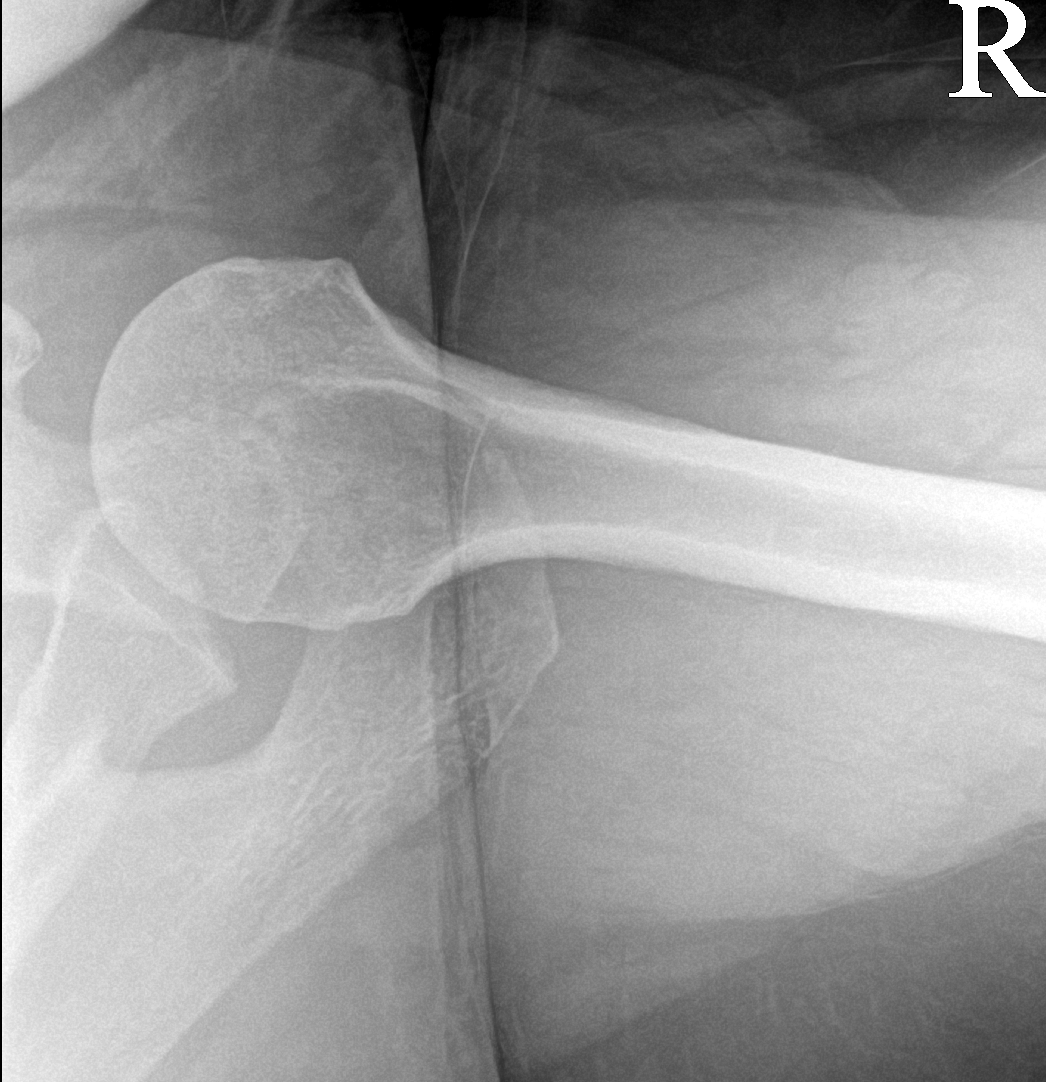

[3 of 3 positions shown; findings below may reference images not displayed]

FINDINGS: There is no acute fracture or dislocation. Mild arthritic changes of
the right AC joint. The soft tissues are unremarkable
IMPRESSION: No acute fracture or dislocation.

## 2023-08-31 ENCOUNTER — Other Ambulatory Visit (HOSPITAL_BASED_OUTPATIENT_CLINIC_OR_DEPARTMENT_OTHER): Payer: Self-pay

## 2023-08-31 MED ORDER — MOUNJARO 15 MG/0.5ML ~~LOC~~ SOAJ
15.0000 mg | SUBCUTANEOUS | 0 refills | Status: DC
  Filled 2023-08-31: qty 2, 28d supply, fill #0

## 2023-09-01 ENCOUNTER — Other Ambulatory Visit (HOSPITAL_BASED_OUTPATIENT_CLINIC_OR_DEPARTMENT_OTHER): Payer: Self-pay

## 2023-09-07 ENCOUNTER — Encounter (HOSPITAL_BASED_OUTPATIENT_CLINIC_OR_DEPARTMENT_OTHER): Payer: Self-pay

## 2023-09-08 ENCOUNTER — Other Ambulatory Visit (HOSPITAL_BASED_OUTPATIENT_CLINIC_OR_DEPARTMENT_OTHER): Payer: Self-pay

## 2023-09-18 ENCOUNTER — Encounter: Payer: Self-pay | Admitting: Family Medicine

## 2023-09-19 ENCOUNTER — Other Ambulatory Visit (HOSPITAL_BASED_OUTPATIENT_CLINIC_OR_DEPARTMENT_OTHER): Payer: Self-pay

## 2023-09-19 MED ORDER — LISDEXAMFETAMINE DIMESYLATE 30 MG PO CAPS
30.0000 mg | ORAL_CAPSULE | Freq: Every morning | ORAL | 0 refills | Status: DC
  Filled 2023-09-19: qty 30, 30d supply, fill #0

## 2023-09-23 ENCOUNTER — Other Ambulatory Visit: Payer: Self-pay | Admitting: Cardiology

## 2023-09-23 ENCOUNTER — Other Ambulatory Visit: Payer: Self-pay

## 2023-09-23 DIAGNOSIS — R7982 Elevated C-reactive protein (CRP): Secondary | ICD-10-CM

## 2023-09-23 DIAGNOSIS — R931 Abnormal findings on diagnostic imaging of heart and coronary circulation: Secondary | ICD-10-CM

## 2023-09-23 MED ORDER — ROSUVASTATIN CALCIUM 10 MG PO TABS: 10.0000 mg | ORAL_TABLET | Freq: Every day | ORAL | 3 refills | Status: DC

## 2023-09-23 MED ORDER — METOPROLOL SUCCINATE ER 25 MG PO TB24: 25.0000 mg | ORAL_TABLET | Freq: Every day | ORAL | 3 refills | Status: AC

## 2023-09-29 ENCOUNTER — Other Ambulatory Visit (HOSPITAL_BASED_OUTPATIENT_CLINIC_OR_DEPARTMENT_OTHER): Payer: Self-pay

## 2023-09-30 ENCOUNTER — Encounter: Payer: Self-pay | Admitting: Family Medicine

## 2023-09-30 ENCOUNTER — Telehealth: Payer: BC Managed Care – PPO | Admitting: Physician Assistant

## 2023-09-30 DIAGNOSIS — Z20818 Contact with and (suspected) exposure to other bacterial communicable diseases: Secondary | ICD-10-CM

## 2023-09-30 DIAGNOSIS — J029 Acute pharyngitis, unspecified: Secondary | ICD-10-CM | POA: Diagnosis not present

## 2023-09-30 MED ORDER — AZITHROMYCIN 250 MG PO TABS: ORAL_TABLET | ORAL | 0 refills | Status: AC

## 2023-09-30 NOTE — Progress Notes (Signed)
E-Visit for Sore Throat - Strep Symptoms  We are sorry that you are not feeling well.  Here is how we plan to help!  Based on what you have shared with me it is likely that you have a pharyngitis.  Your attached photo is not consistent with a strep throat, but doesn't always have to be at onset of symptoms. Giving your exposures I will treat as strep to be cautious. Strep pharyngitis is inflammation and infection in the back of the throat.  This is an infection cause by bacteria and is treated with antibiotics.  I have prescribed Azithromycin 250 mg two tablets today and then one daily for 4 additional days. For throat pain, we recommend over the counter oral pain relief medications such as acetaminophen or aspirin, or anti-inflammatory medications such as ibuprofen or naproxen sodium. Topical treatments such as oral throat lozenges or sprays may be used as needed. Strep infections are not as easily transmitted as other respiratory infections, however we still recommend that you avoid close contact with loved ones, especially the very young and elderly.  Remember to wash your hands thoroughly throughout the day as this is the number one way to prevent the spread of infection and wipe down door knobs and counters with disinfectant.   Home Care: Only take medications as instructed by your medical team. Complete the entire course of an antibiotic. Do not take these medications with alcohol. A steam or ultrasonic humidifier can help congestion.  You can place a towel over your head and breathe in the steam from hot water coming from a faucet. Avoid close contacts especially the very young and the elderly. Cover your mouth when you cough or sneeze. Always remember to wash your hands.  Get Help Right Away If: You develop worsening fever or sinus pain. You develop a severe head ache or visual changes. Your symptoms persist after you have completed your treatment plan.  Make sure you Understand these  instructions. Will watch your condition. Will get help right away if you are not doing well or get worse.   Thank you for choosing an e-visit.  Your e-visit answers were reviewed by a board certified advanced clinical practitioner to complete your personal care plan. Depending upon the condition, your plan could have included both over the counter or prescription medications.  Please review your pharmacy choice. Make sure the pharmacy is open so you can pick up prescription now. If there is a problem, you may contact your provider through [employer] and have the prescription routed to another pharmacy.  Your safety is important to [place]. If you have drug allergies check your prescription carefully.   For the next 24 hours you can use MyChart to ask questions about today's visit, request a non-urgent call back, or ask for a work or school excuse. You will get an email in the next two days asking about your experience. I hope that your e-visit has been valuable and will speed your recovery.

## 2023-09-30 NOTE — Progress Notes (Signed)
I have spent 5 minutes in review of e-visit questionnaire, review and updating patient chart, medical decision making and response to patient.   Mia Milan Cody Jacklynn Dehaas, PA-C    

## 2023-10-01 ENCOUNTER — Telehealth: Payer: BC Managed Care – PPO | Admitting: Physician Assistant

## 2023-10-01 DIAGNOSIS — U071 COVID-19: Secondary | ICD-10-CM

## 2023-10-01 MED ORDER — NIRMATRELVIR/RITONAVIR (PAXLOVID)TABLET: 3.0000 | ORAL_TABLET | Freq: Two times a day (BID) | ORAL | 0 refills | Status: AC

## 2023-10-01 NOTE — Progress Notes (Signed)
Virtual Visit Consent   [name], you are scheduled for a virtual visit with a [facility] provider today. Just as with appointments in the office, your consent must be obtained to participate. Your consent will be active for this visit and any virtual visit you may have with one of our providers in the next 365 days. If you have a MyChart account, a copy of this consent can be sent to you electronically.  As this is a virtual visit, video technology does not allow for your provider to perform a traditional examination. This may limit your provider's ability to fully assess your condition. If your provider identifies any concerns that need to be evaluated in person or the need to arrange testing (such as labs, EKG, etc.), we will make arrangements to do so. Although advances in technology are sophisticated, we cannot ensure that it will always work on either your end or our end. If the connection with a video visit is poor, the visit may have to be switched to a telephone visit. With either a video or telephone visit, we are not always able to ensure that we have a secure connection.  By engaging in this virtual visit, you consent to the provision of healthcare and authorize for your insurance to be billed (if applicable) for the services provided during this visit. Depending on your insurance coverage, you may receive a charge related to this service.  I need to obtain your verbal consent now. Are you willing to proceed with your visit today? Jessica Silva has provided verbal consent on 10/01/2023 for a virtual visit (video or telephone). Gilberto Better, [place]  Date: 10/01/2023 6:21 PM  Virtual Visit via Video Note   I, Gilberto Better, connected with  Jessica Silva  ([id number], 37-Sep-1987) on 10/01/23 at  6:15 PM EST by a video-enabled telemedicine application and verified that I am speaking with the correct person using two identifiers.  Location: Patient: Virtual Visit Location Patient:  Home Provider: Virtual Visit Location Provider: Home Office   I discussed the limitations of evaluation and management by telemedicine and the availability of in person appointments. The patient expressed understanding and agreed to proceed.    History of Present Illness: Jessica Silva is a 38 y.o. who identifies as a female who was assigned female at birth, and is being seen today for positive covid infection.  HPI: 38 y/o F presents via virtual video visit for testing positive for covid yesterday. Severe body aches, headaches, fever. Thought she had strep and was started on Zpack, but she tested positive for covid instead. No h/o kidney disease.   URI     Problems:  Patient Active Problem List   Diagnosis Date Noted   ADD (attention deficit disorder) 08/18/2023   PVC (premature ventricular contraction) 08/05/2022   Elevated coronary artery calcium score 08/05/2022   Palpitations 06/30/2020   Tachycardia 06/24/2020   S/P cesarean section 04/19/2018   Pregnant 04/17/2018   Thyroid disease during pregnancy in second trimester    [redacted] weeks gestation of pregnancy    Obesity complicating pregnancy in second trimester    Encounter for screening for coronary artery disease    Premature ovarian failure 05/20/2016   Depression, recurrent (HCC) 07/17/2015   Weight gain 07/14/2013    Allergies:  Allergies  Allergen Reactions   Bee Venom Hives   Penicillins Hives    Has patient had a PCN reaction causing immediate rash, facial/tongue/throat swelling, SOB or lightheadedness with hypotension: unknown  Has patient had a PCN reaction causing severe rash involving mucus membranes or skin necrosis: unknown Has patient had a PCN reaction that required hospitalization no Has patient had a PCN reaction occurring within the last 10 years: no If all of the above answers are "NO", then may proceed with Cephalosporin use.    Medications:  Current Outpatient Medications:    nirmatrelvir/ritonavir  (PAXLOVID) 20 x 150 MG & 10 x 100MG  TABS, Take 3 tablets by mouth 2 (two) times daily for 5 days. (Take nirmatrelvir 150 mg two tablets twice daily for 5 days and ritonavir 100 mg one tablet twice daily for 5 days) Patient GFR is 73, Disp: 30 tablet, Rfl: 0   azithromycin (ZITHROMAX) 250 MG tablet, Take 2 tablets on day 1, then 1 tablet daily on days 2 through 5, Disp: 6 tablet, Rfl: 0   estradiol (VIVELLE-DOT) 0.05 MG/24HR patch, Place 1 patch onto the skin 2 (two) times a week., Disp: , Rfl:    lisdexamfetamine (VYVANSE) 30 MG capsule, Take 1 capsule (30 mg total) by mouth in the morning., Disp: 30 capsule, Rfl: 0   metoprolol succinate (TOPROL XL) 25 MG 24 hr tablet, Take 1 tablet (25 mg total) by mouth daily., Disp: 90 tablet, Rfl: 3   MOUNJARO 15 MG/0.5ML Pen, Inject 15 mg into the skin once a week., Disp: 2 mL, Rfl: 0   progesterone (PROMETRIUM) 100 MG capsule, Take 100 mg by mouth daily., Disp: , Rfl:    rosuvastatin (CRESTOR) 10 MG tablet, Take 1 tablet (10 mg total) by mouth daily., Disp: 90 tablet, Rfl: 3   Thyroid (LEVOTHYROXINE-LIOTHYRONINE PO), Take 137 mcg by mouth daily., Disp: , Rfl:   Observations/Objective: Patient is well-developed, well-nourished in no acute distress.  Resting comfortably  at home.  Head is normocephalic, atraumatic.  No labored breathing.  Speech is clear and coherent with logical content.  Patient is alert and oriented at baseline.    Assessment and Plan: 1. COVID-19 virus infection (Primary) - nirmatrelvir/ritonavir (PAXLOVID) 20 x 150 MG & 10 x 100MG  TABS; Take 3 tablets by mouth 2 (two) times daily for 5 days. (Take nirmatrelvir 150 mg two tablets twice daily for 5 days and ritonavir 100 mg one tablet twice daily for 5 days) Patient GFR is 73  Dispense: 30 tablet; Refill: 0  Stay well hydrated. Continue with Ibuprofen or Tylenol for fever and body aches. Continue to watch for worsening symptoms. Reviewed Paxlovid side effects and cautioned on the  use of this medicine, but patient requested it.  Pt verbalized understanding and in agreement.     Follow Up Instructions: I discussed the assessment and treatment plan with the patient. The patient was provided an opportunity to ask questions and all were answered. The patient agreed with the plan and demonstrated an understanding of the instructions.  A copy of instructions were sent to the patient via MyChart unless otherwise noted below.   Patient has requested to receive PHI (AVS, Work Notes, etc) pertaining to this video visit through e-mail as they are currently without active MyChart. They have voiced understand that email is not considered secure and their health information could be viewed by someone other than the patient.   The patient was advised to call back or seek an in-person evaluation if the symptoms worsen or if the condition fails to improve as anticipated.    Gilberto Better, PA-C

## 2023-10-01 NOTE — Patient Instructions (Signed)
Jessica Silva, thank you for joining Gilberto Better, PA-C for today's virtual visit.  While this provider is not your primary care provider (PCP), if your PCP is located in our provider database this encounter information will be shared with them immediately following your visit.   A [facility] MyChart account gives you access to today's visit and all your visits, tests, and labs performed at [facility] " click here if you don't have a [facility] MyChart account or go to [email or web address]  Consent: (Patient) Jessica Silva provided verbal consent for this virtual visit at the beginning of the encounter.  Current Medications:  Current Outpatient Medications:    nirmatrelvir/ritonavir (PAXLOVID) 20 x 150 MG & 10 x 100MG  TABS, Take 3 tablets by mouth 2 (two) times daily for 5 days. (Take nirmatrelvir 150 mg two tablets twice daily for 5 days and ritonavir 100 mg one tablet twice daily for 5 days) Patient GFR is 73, Disp: 30 tablet, Rfl: 0   azithromycin (ZITHROMAX) 250 MG tablet, Take 2 tablets on day 1, then 1 tablet daily on days 2 through 5, Disp: 6 tablet, Rfl: 0   estradiol (VIVELLE-DOT) 0.05 MG/24HR patch, Place 1 patch onto the skin 2 (two) times a week., Disp: , Rfl:    lisdexamfetamine (VYVANSE) 30 MG capsule, Take 1 capsule (30 mg total) by mouth in the morning., Disp: 30 capsule, Rfl: 0   metoprolol succinate (TOPROL XL) 25 MG 24 hr tablet, Take 1 tablet (25 mg total) by mouth daily., Disp: 90 tablet, Rfl: 3   MOUNJARO 15 MG/0.5ML Pen, Inject 15 mg into the skin once a week., Disp: 2 mL, Rfl: 0   progesterone (PROMETRIUM) 100 MG capsule, Take 100 mg by mouth daily., Disp: , Rfl:    rosuvastatin (CRESTOR) 10 MG tablet, Take 1 tablet (10 mg total) by mouth daily., Disp: 90 tablet, Rfl: 3   Thyroid (LEVOTHYROXINE-LIOTHYRONINE PO), Take 137 mcg by mouth daily., Disp: , Rfl:    Medications ordered in this encounter:  Meds ordered this encounter  Medications    nirmatrelvir/ritonavir (PAXLOVID) 20 x 150 MG & 10 x 100MG  TABS    Sig: Take 3 tablets by mouth 2 (two) times daily for 5 days. (Take nirmatrelvir 150 mg two tablets twice daily for 5 days and ritonavir 100 mg one tablet twice daily for 5 days) Patient GFR is 73    Dispense:  30 tablet    Refill:  0    Supervising Provider:   Merrilee Jansky [phone]     *If you need refills on other medications prior to your next appointment, please contact your pharmacy*  Follow-Up: Call back or seek an in-person evaluation if the symptoms worsen or if the condition fails to improve as anticipated.  [facility] [phone]  Other Instructions Stay well hydrated. Continue with Ibuprofen or Tylenol for fever and body aches. Continue to watch for worsening symptoms. Reviewed Paxlovid side effects with patient.    If you have been instructed to have an in-person evaluation today at a local Urgent Care facility, please use the link below. It will take you to a list of all of our available [facility], including address, phone number and hours of operation. Please do not delay care.  [facility]  If you or a family member do not have a primary care provider, use the link below to schedule a visit and establish care. When you choose a   primary care physician or advanced practice provider, you gain a long-term partner in health. Find a Primary Care Provider  Learn more about [facility]'s in-office and virtual care options: [facility] - Get Care Now

## 2023-10-10 ENCOUNTER — Other Ambulatory Visit (HOSPITAL_BASED_OUTPATIENT_CLINIC_OR_DEPARTMENT_OTHER): Payer: Self-pay

## 2023-10-10 DIAGNOSIS — E2839 Other primary ovarian failure: Secondary | ICD-10-CM | POA: Diagnosis not present

## 2023-10-10 DIAGNOSIS — N939 Abnormal uterine and vaginal bleeding, unspecified: Secondary | ICD-10-CM | POA: Diagnosis not present

## 2023-10-10 MED ORDER — MOUNJARO 15 MG/0.5ML ~~LOC~~ SOAJ
15.0000 mg | SUBCUTANEOUS | 0 refills | Status: DC
  Filled 2023-10-10: qty 2, 28d supply, fill #0

## 2023-10-17 ENCOUNTER — Other Ambulatory Visit: Payer: Self-pay | Admitting: Cardiology

## 2023-11-08 ENCOUNTER — Other Ambulatory Visit: Payer: Self-pay | Admitting: Family Medicine

## 2023-11-10 MED ORDER — LISDEXAMFETAMINE DIMESYLATE 30 MG PO CAPS
30.0000 mg | ORAL_CAPSULE | Freq: Every morning | ORAL | 0 refills | Status: DC
  Filled 2023-11-10: qty 30, 30d supply, fill #0

## 2023-11-10 MED ORDER — LISDEXAMFETAMINE DIMESYLATE 30 MG PO CAPS
30.0000 mg | ORAL_CAPSULE | Freq: Every day | ORAL | 0 refills | Status: DC
  Filled 2023-11-10: qty 30, 30d supply, fill #0

## 2023-11-11 ENCOUNTER — Other Ambulatory Visit (HOSPITAL_BASED_OUTPATIENT_CLINIC_OR_DEPARTMENT_OTHER): Payer: Self-pay

## 2023-11-11 ENCOUNTER — Other Ambulatory Visit: Payer: Self-pay

## 2023-11-11 MED ORDER — MOUNJARO 15 MG/0.5ML ~~LOC~~ SOAJ
15.0000 mg | SUBCUTANEOUS | 0 refills | Status: DC
Start: 2023-11-11 — End: 2023-12-11
  Filled 2023-11-11: qty 2, 28d supply, fill #0

## 2023-11-14 DIAGNOSIS — E288 Other ovarian dysfunction: Secondary | ICD-10-CM | POA: Diagnosis not present

## 2023-11-14 DIAGNOSIS — E785 Hyperlipidemia, unspecified: Secondary | ICD-10-CM | POA: Diagnosis not present

## 2023-11-14 DIAGNOSIS — E669 Obesity, unspecified: Secondary | ICD-10-CM | POA: Diagnosis not present

## 2023-11-14 DIAGNOSIS — E890 Postprocedural hypothyroidism: Secondary | ICD-10-CM | POA: Diagnosis not present

## 2023-11-17 ENCOUNTER — Other Ambulatory Visit: Payer: Self-pay | Admitting: Cardiology

## 2023-11-17 DIAGNOSIS — E669 Obesity, unspecified: Secondary | ICD-10-CM | POA: Diagnosis not present

## 2023-11-17 DIAGNOSIS — R7982 Elevated C-reactive protein (CRP): Secondary | ICD-10-CM

## 2023-11-17 DIAGNOSIS — R931 Abnormal findings on diagnostic imaging of heart and coronary circulation: Secondary | ICD-10-CM

## 2023-11-17 DIAGNOSIS — E890 Postprocedural hypothyroidism: Secondary | ICD-10-CM | POA: Diagnosis not present

## 2023-11-17 DIAGNOSIS — E288 Other ovarian dysfunction: Secondary | ICD-10-CM | POA: Diagnosis not present

## 2023-11-17 DIAGNOSIS — E785 Hyperlipidemia, unspecified: Secondary | ICD-10-CM | POA: Diagnosis not present

## 2023-12-11 ENCOUNTER — Other Ambulatory Visit (HOSPITAL_BASED_OUTPATIENT_CLINIC_OR_DEPARTMENT_OTHER): Payer: Self-pay

## 2023-12-11 MED ORDER — MOUNJARO 15 MG/0.5ML ~~LOC~~ SOAJ
15.0000 mg | SUBCUTANEOUS | 0 refills | Status: DC
  Filled 2023-12-11: qty 2, 28d supply, fill #0

## 2023-12-29 ENCOUNTER — Other Ambulatory Visit: Payer: Self-pay | Admitting: Family Medicine

## 2024-01-01 MED ORDER — LISDEXAMFETAMINE DIMESYLATE 30 MG PO CAPS
30.0000 mg | ORAL_CAPSULE | Freq: Every day | ORAL | 0 refills | Status: DC
  Filled 2024-01-01: qty 30, 30d supply, fill #0

## 2024-01-01 MED ORDER — LISDEXAMFETAMINE DIMESYLATE 30 MG PO CAPS
30.0000 mg | ORAL_CAPSULE | Freq: Every morning | ORAL | 0 refills | Status: DC
  Filled 2024-01-01: qty 30, 30d supply, fill #0

## 2024-01-02 ENCOUNTER — Other Ambulatory Visit: Payer: Self-pay

## 2024-01-02 ENCOUNTER — Other Ambulatory Visit (HOSPITAL_BASED_OUTPATIENT_CLINIC_OR_DEPARTMENT_OTHER): Payer: Self-pay

## 2024-01-05 DIAGNOSIS — F4312 Post-traumatic stress disorder, chronic: Secondary | ICD-10-CM | POA: Diagnosis not present

## 2024-01-07 ENCOUNTER — Encounter (HOSPITAL_BASED_OUTPATIENT_CLINIC_OR_DEPARTMENT_OTHER): Payer: Self-pay

## 2024-01-07 ENCOUNTER — Other Ambulatory Visit (HOSPITAL_BASED_OUTPATIENT_CLINIC_OR_DEPARTMENT_OTHER): Payer: Self-pay

## 2024-01-08 ENCOUNTER — Other Ambulatory Visit (HOSPITAL_BASED_OUTPATIENT_CLINIC_OR_DEPARTMENT_OTHER): Payer: Self-pay

## 2024-01-09 ENCOUNTER — Other Ambulatory Visit (HOSPITAL_BASED_OUTPATIENT_CLINIC_OR_DEPARTMENT_OTHER): Payer: Self-pay

## 2024-01-09 MED ORDER — MOUNJARO 15 MG/0.5ML ~~LOC~~ SOAJ
15.0000 mg | SUBCUTANEOUS | 0 refills | Status: AC
Start: 2024-01-09 — End: ?
  Filled 2024-01-09: qty 6, 84d supply, fill #0

## 2024-01-19 DIAGNOSIS — F4312 Post-traumatic stress disorder, chronic: Secondary | ICD-10-CM | POA: Diagnosis not present

## 2024-02-02 DIAGNOSIS — F4312 Post-traumatic stress disorder, chronic: Secondary | ICD-10-CM | POA: Diagnosis not present

## 2024-03-01 DIAGNOSIS — F4312 Post-traumatic stress disorder, chronic: Secondary | ICD-10-CM | POA: Diagnosis not present

## 2024-03-15 DIAGNOSIS — F4312 Post-traumatic stress disorder, chronic: Secondary | ICD-10-CM | POA: Diagnosis not present

## 2024-03-19 ENCOUNTER — Telehealth: Admitting: Physician Assistant

## 2024-03-19 DIAGNOSIS — H6690 Otitis media, unspecified, unspecified ear: Secondary | ICD-10-CM

## 2024-03-19 MED ORDER — AZITHROMYCIN 250 MG PO TABS
ORAL_TABLET | ORAL | 0 refills | Status: AC
Start: 2024-03-19 — End: 2024-03-24

## 2024-03-19 NOTE — Progress Notes (Signed)

## 2024-03-25 ENCOUNTER — Encounter (HOSPITAL_BASED_OUTPATIENT_CLINIC_OR_DEPARTMENT_OTHER): Payer: Self-pay

## 2024-03-25 ENCOUNTER — Other Ambulatory Visit (HOSPITAL_BASED_OUTPATIENT_CLINIC_OR_DEPARTMENT_OTHER): Payer: Self-pay

## 2024-03-25 MED ORDER — ZEPBOUND 10 MG/0.5ML ~~LOC~~ SOAJ
10.0000 mg | SUBCUTANEOUS | 0 refills | Status: DC
  Filled 2024-03-25: qty 2, 28d supply, fill #0

## 2024-03-26 ENCOUNTER — Other Ambulatory Visit (HOSPITAL_BASED_OUTPATIENT_CLINIC_OR_DEPARTMENT_OTHER): Payer: Self-pay

## 2024-03-26 MED ORDER — MOUNJARO 10 MG/0.5ML ~~LOC~~ SOAJ
10.0000 mg | SUBCUTANEOUS | 0 refills | Status: DC
  Filled 2024-03-26: qty 2, 28d supply, fill #0

## 2024-03-29 DIAGNOSIS — F4312 Post-traumatic stress disorder, chronic: Secondary | ICD-10-CM | POA: Diagnosis not present

## 2024-03-30 ENCOUNTER — Other Ambulatory Visit: Payer: Self-pay | Admitting: Family Medicine

## 2024-03-31 ENCOUNTER — Other Ambulatory Visit (HOSPITAL_BASED_OUTPATIENT_CLINIC_OR_DEPARTMENT_OTHER): Payer: Self-pay

## 2024-03-31 MED ORDER — LISDEXAMFETAMINE DIMESYLATE 30 MG PO CAPS
30.0000 mg | ORAL_CAPSULE | Freq: Every day | ORAL | 0 refills | Status: DC
  Filled 2024-03-31 – 2024-07-10 (×3): qty 30, 30d supply, fill #0

## 2024-03-31 MED ORDER — LISDEXAMFETAMINE DIMESYLATE 30 MG PO CAPS
30.0000 mg | ORAL_CAPSULE | Freq: Every day | ORAL | 0 refills | Status: DC
  Filled 2024-03-31: qty 30, 30d supply, fill #0

## 2024-03-31 MED ORDER — LISDEXAMFETAMINE DIMESYLATE 30 MG PO CAPS
30.0000 mg | ORAL_CAPSULE | Freq: Every morning | ORAL | 0 refills | Status: DC
  Filled 2024-03-31 – 2024-05-26 (×2): qty 30, 30d supply, fill #0

## 2024-04-12 DIAGNOSIS — F4312 Post-traumatic stress disorder, chronic: Secondary | ICD-10-CM | POA: Diagnosis not present

## 2024-04-25 ENCOUNTER — Other Ambulatory Visit (HOSPITAL_BASED_OUTPATIENT_CLINIC_OR_DEPARTMENT_OTHER): Payer: Self-pay

## 2024-04-25 MED ORDER — MOUNJARO 12.5 MG/0.5ML ~~LOC~~ SOAJ
12.5000 mg | SUBCUTANEOUS | 0 refills | Status: DC
  Filled 2024-04-25: qty 2, 28d supply, fill #0
  Filled 2024-05-26: qty 2, 28d supply, fill #1

## 2024-05-10 DIAGNOSIS — F4312 Post-traumatic stress disorder, chronic: Secondary | ICD-10-CM | POA: Diagnosis not present

## 2024-05-26 ENCOUNTER — Other Ambulatory Visit (HOSPITAL_BASED_OUTPATIENT_CLINIC_OR_DEPARTMENT_OTHER): Payer: Self-pay

## 2024-05-31 ENCOUNTER — Telehealth: Admitting: Physician Assistant

## 2024-05-31 ENCOUNTER — Other Ambulatory Visit (HOSPITAL_BASED_OUTPATIENT_CLINIC_OR_DEPARTMENT_OTHER): Payer: Self-pay

## 2024-05-31 DIAGNOSIS — B370 Candidal stomatitis: Secondary | ICD-10-CM

## 2024-05-31 MED ORDER — NYSTATIN 100000 UNIT/ML MT SUSP
5.0000 mL | Freq: Four times a day (QID) | OROMUCOSAL | 0 refills | Status: AC
  Filled 2024-05-31: qty 100, 5d supply, fill #0

## 2024-05-31 NOTE — Progress Notes (Signed)
 I have spent 5 minutes in review of e-visit questionnaire, review and updating patient chart, medical decision making and response to patient.   Elsie Velma Lunger, PA-C

## 2024-05-31 NOTE — Progress Notes (Signed)
 E-Visit Treatment for Thrush symptoms  We are sorry that you are not feeling well. Here is how we plan to help!  Based on what you have shared with me, it appears that you have Thrush.  Celestino is a fungal (yeast) infection that can grow in your mouth, throat, and other parts of your body. With oral thrush (oral candidiasis), you may develop white, raised, cottage cheese-like lesions (spots) on your tongue and cheeks. Celestino can quickly become irritated and cause mouth pain and redness. Thrush usually develops suddenly. A common sign is the presence of creamy white, slightly raised lesions in your mouth -- usually on your tongue or inner cheeks. You may also have lesions on the roof of your mouth, gums, tonsils, or back of your throat.   Other symptoms may include: Redness and soreness inside and at the corners of your mouth Loss of sense of taste ([place]) Cottony feeling in your mouth  The lesions can hurt and may bleed a little when you scrape them or brush your teeth. In severe cases, the lesions can spread into your esophagus and cause: Pain or difficulty swallowing A feeling that food gets stuck in your throat or mid-chest area Fever, if the infection spreads beyond your esophagus  Most people have small amounts of the Candida fungus in their mouth, digestive tract and skin. When illnesses, stress or medications disturb this balance, the fungus grows out of control and causes thrush.  Medications that can make yeast flourish and cause infection include: Corticosteroids Inhalers Antibiotics Birth Control Pills  Celestino can be contagious to those at risk (like people with weakened immune systems or who take certain medications). In people with healthy immune systems, it's unusual to pass thrush through kissing or other close contact. In most cases, thrush isn't particularly contagious (meaning, it doesn't spread from person to person), but it is transmittable (meaning, you can catch it  in other ways, like through saliva when you are immunocompromised).  I have prescribed I have prescribed an antifungal - Nystatin  suspension 100,000 units/mL Swish and swallow 5mL every 6 hours for up to 10-14 days  Prevention: Practice good oral hygiene. See your dentist regularly. This is especially important if you have diabetes or wear dentures. Limit the amount of sugar and yeast-containing foods you eat. Foods such as bread, beer and wine encourage Candida growth. Avoid smoking and other tobacco use. Ask your healthcare provider about ways to help you quit smoking (We do offer a smoking cessation program through the [facility] that you can schedule on your time through MyChart). With treatment, thrush usually goes away within one to two weeks. But if your symptoms linger or get worse, please seek in-person evaluation.  Home Care: Swish with warm saltwater. Take probiotics. Eat yogurt that contains healthy bacteria.  GET HELP RIGHT AWAY IF: Ulcers that are spreading, are very large or particularly painful Ulcers last longer than one week without improving on treatment If you develop a fever, swollen glands and begin to feel unwell  MAKE SURE YOU: Understand these instructions Will watch your condition. Will get help right away if you are not doing well or get worse.  Thank you for choosing an e-visit!  Your e-visit answers were reviewed by a board certified advanced clinical practitioner to complete your personal care plan. Depending upon the condition, your plan could have included both over the counter or prescription medications.  Please review your pharmacy choice. Make sure the pharmacy is open so  you can pick up prescription now. If there is a problem, you may contact your provider through [employer] and have the prescription routed to another pharmacy. Your safety is important to us . If you have drug allergies, check your prescription  carefully.  For the next 24 hours you can use MyChart to ask questions about today's visit, request a non-urgent call back, or ask for a work or school excuse.  You will get an email in the next two days asking about your experience. I hope that your e-visit has been valuable and will speed your recovery.

## 2024-06-10 ENCOUNTER — Telehealth: Admitting: Physician Assistant

## 2024-06-10 DIAGNOSIS — F4312 Post-traumatic stress disorder, chronic: Secondary | ICD-10-CM | POA: Diagnosis not present

## 2024-06-10 DIAGNOSIS — H1031 Unspecified acute conjunctivitis, right eye: Secondary | ICD-10-CM | POA: Diagnosis not present

## 2024-06-10 MED ORDER — POLYMYXIN B-TRIMETHOPRIM 10000-0.1 UNIT/ML-% OP SOLN: OPHTHALMIC | 0 refills | Status: AC

## 2024-06-10 NOTE — Progress Notes (Signed)
 We are sorry that you are not feeling well.  Here is how we plan to help!  Based on what you have shared with me it looks like you have conjunctivitis.  Conjunctivitis is a common inflammatory or infectious condition of the eye that is often referred to as pink eye.  In most cases it is contagious (viral or bacterial). However, not all conjunctivitis requires antibiotics (ex. Allergic).  We have made appropriate suggestions for you based upon your presentation.  I have prescribed Polytrim  Ophthalmic drops 1-2 drops 4 times a day times 5 days  Pink eye can be highly contagious.  It is typically spread through direct contact with secretions, or contaminated objects or surfaces that one may have touched.  Strict handwashing is suggested with soap and water is urged.  If not available, use alcohol based had sanitizer.  Avoid unnecessary touching of the eye.  If you wear contact lenses, you will need to refrain from wearing them until you see no white discharge from the eye for at least 24 hours after being on medication.  You should see symptom improvement in 1-2 days after starting the medication regimen.  Call us  if symptoms are not improved in 1-2 days.  Home Care: Wash your hands often! Do not wear your contacts until you complete your treatment plan. Avoid sharing towels, bed linen, personal items with a person who has pink eye. See attention for anyone in your home with similar symptoms.  Get Help Right Away If: Your symptoms do not improve. You develop blurred or loss of vision. Your symptoms worsen (increased discharge, pain or redness)  Your e-visit answers were reviewed by a board certified advanced clinical practitioner to complete your personal care plan.  Depending on the condition, your plan could have included both over the counter or prescription medications.  If there is a problem please reply  once you have received a response from your provider.  Your safety is important to  us .  If you have drug allergies check your prescription carefully.    You can use MyChart to ask questions about today's visit, request a non-urgent call back, or ask for a work or school excuse for 24 hours related to this e-Visit. If it has been greater than 24 hours you will need to follow up with your provider, or enter a new e-Visit to address those concerns.   You will get an e-mail in the next two days asking about your experience.  I hope that your e-visit has been valuable and will speed your recovery. Thank you for using e-visits.  I have spent 5 minutes in review of e-visit questionnaire, review and updating patient chart, medical decision making and response to patient.   Elsie Velma Lunger, PA-C

## 2024-06-10 NOTE — Addendum Note (Signed)
 Addended by: GLADIS ELSIE BROCKS on: 06/10/2024 02:19 PM   Modules accepted: Orders

## 2024-06-18 ENCOUNTER — Telehealth: Admitting: Family Medicine

## 2024-06-18 DIAGNOSIS — J208 Acute bronchitis due to other specified organisms: Secondary | ICD-10-CM

## 2024-06-18 DIAGNOSIS — B9689 Other specified bacterial agents as the cause of diseases classified elsewhere: Secondary | ICD-10-CM | POA: Diagnosis not present

## 2024-06-19 MED ORDER — AZITHROMYCIN 250 MG PO TABS: ORAL_TABLET | ORAL | 0 refills | Status: AC

## 2024-06-19 MED ORDER — PROMETHAZINE-DM 6.25-15 MG/5ML PO SYRP: 5.0000 mL | ORAL_SOLUTION | Freq: Four times a day (QID) | ORAL | 0 refills | Status: AC | PRN

## 2024-06-19 NOTE — Progress Notes (Signed)
 We are sorry that you are not feeling well.  Here is how we plan to help!  Based on your presentation I believe you most likely have A cough due to bacteria.  When patients have a fever and a productive cough with a change in color or increased sputum production, we are concerned about bacterial bronchitis.  If left untreated it can progress to pneumonia.  If your symptoms do not improve with your treatment plan it is important that you contact your provider.   I have prescribed Azithromyin 250 mg: two tablets now and then one tablet daily for 4 additonal days    In addition you may use promethazine  dm for cough.   From your responses in the eVisit questionnaire you describe inflammation in the upper respiratory tract which is causing a significant cough.  This is commonly called Bronchitis and has four common causes:   Allergies Viral Infections Acid Reflux Bacterial Infection Allergies, viruses and acid reflux are treated by controlling symptoms or eliminating the cause. An example might be a cough caused by taking certain blood pressure medications. You stop the cough by changing the medication. Another example might be a cough caused by acid reflux. Controlling the reflux helps control the cough.  USE OF BRONCHODILATOR (RESCUE) INHALERS: There is a risk from using your bronchodilator too frequently.  The risk is that over-reliance on a medication which only relaxes the muscles surrounding the breathing tubes can reduce the effectiveness of medications prescribed to reduce swelling and congestion of the tubes themselves.  Although you feel brief relief from the bronchodilator inhaler, your asthma may actually be worsening with the tubes becoming more swollen and filled with mucus.  This can delay other crucial treatments, such as oral steroid medications. If you need to use a bronchodilator inhaler daily, several times per day, you should discuss this with your provider.  There are probably better  treatments that could be used to keep your asthma under control.     HOME CARE Only take medications as instructed by your medical team. Complete the entire course of an antibiotic. Drink plenty of fluids and get plenty of rest. Avoid close contacts especially the very young and the elderly Cover your mouth if you cough or cough into your sleeve. Always remember to wash your hands A steam or ultrasonic humidifier can help congestion.   GET HELP RIGHT AWAY IF: You develop worsening fever. You become short of breath You cough up blood. Your symptoms persist after you have completed your treatment plan MAKE SURE YOU  Understand these instructions. Will watch your condition. Will get help right away if you are not doing well or get worse.  Your e-visit answers were reviewed by a board certified advanced clinical practitioner to complete your personal care plan.  Depending on the condition, your plan could have included both over the counter or prescription medications. If there is a problem please reply  once you have received a response from your provider. Your safety is important to us .  If you have drug allergies check your prescription carefully.    You can use MyChart to ask questions about today's visit, request a non-urgent call back, or ask for a work or school excuse for 24 hours related to this e-Visit. If it has been greater than 24 hours you will need to follow up with your provider, or enter a new e-Visit to address those concerns. You will get an e-mail in the next two days asking about your  experience.  I hope that your e-visit has been valuable and will speed your recovery. Thank you for using e-visits.   I have spent 5 minutes in review of e-visit questionnaire, review and updating patient chart, medical decision making and response to patient.   Kyan Yurkovich, FNP

## 2024-06-24 DIAGNOSIS — F4312 Post-traumatic stress disorder, chronic: Secondary | ICD-10-CM | POA: Diagnosis not present

## 2024-07-04 ENCOUNTER — Telehealth: Admitting: Nurse Practitioner

## 2024-07-04 DIAGNOSIS — K031 Abrasion of teeth: Secondary | ICD-10-CM

## 2024-07-04 DIAGNOSIS — K009 Disorder of tooth development, unspecified: Secondary | ICD-10-CM

## 2024-07-04 MED ORDER — CHLORHEXIDINE GLUCONATE 0.12 % MT SOLN: 15.0000 mL | Freq: Two times a day (BID) | OROMUCOSAL | 0 refills | Status: AC

## 2024-07-04 MED ORDER — IBUPROFEN 800 MG PO TABS: 800.0000 mg | ORAL_TABLET | Freq: Three times a day (TID) | ORAL | 0 refills | Status: DC | PRN

## 2024-07-04 NOTE — Progress Notes (Signed)
 It sounds like you have more of a nerve issue than infection requiring antibiotic. I am sending a prescription antibacterial mouthwash and prescription motrin  for pain. I would recommend you see the dentist for an xray as there appears to be nerve exposure or damage   E-Visit for Dental Pain  We are sorry that you are not feeling well.  Here is how we plan to help!  Based on what you have shared with me in the questionnaire, it sounds like you have dental pain from nerve damage  Ibuprofen  800mg  3 times a day for 7 days for discomfort and peridex for any bacteria around the gumline.  It is imperative that you see a dentist within 10 days of this eVisit to determine the cause of the dental pain and be sure it is adequately treated  A toothache or tooth pain is caused when the nerve in the root of a tooth or surrounding a tooth is irritated. Dental (tooth) infection, decay, injury, or loss of a tooth are the most common causes of dental pain. Pain may also occur after an extraction (tooth is pulled out). Pain sometimes originates from other areas and radiates to the jaw, thus appearing to be tooth pain.Bacteria growing inside your mouth can contribute to gum disease and dental decay, both of which can cause pain. A toothache occurs from inflammation of the central portion of the tooth called pulp. The pulp contains nerve endings that are very sensitive to pain. Inflammation to the pulp or pulpitis may be caused by dental cavities, trauma, and infection.    HOME CARE:   For toothaches: Over-the-counter pain medications such as acetaminophen  or ibuprofen  may be used. Take these as directed on the package while you arrange for a dental appointment. Avoid very cold or hot foods, because they may make the pain worse. You may get relief from biting on a cotton ball soaked in oil of cloves. You can get oil of cloves at most drug stores.  For jaw pain:  Aspirin may be helpful for problems in the joint of  the jaw in adults. If pain happens every time you open your mouth widely, the temporomandibular joint (TMJ) may be the source of the pain. Yawning or taking a large bite of food may worsen the pain. An appointment with your doctor or dentist will help you find the cause.     GET HELP RIGHT AWAY IF:  You have a high fever or chills If you have had a recent head or face injury and develop headache, light headedness, nausea, vomiting, or other symptoms that concern you after an injury to your face or mouth, you could have a more serious injury in addition to your dental injury. A facial rash associated with a toothache: This condition may improve with medication. Contact your doctor for them to decide what is appropriate. Any jaw pain occurring with chest pain: Although jaw pain is most commonly caused by dental disease, it is sometimes referred pain from other areas. People with heart disease, especially people who have had stents placed, people with diabetes, or those who have had heart surgery may have jaw pain as a symptom of heart attack or angina. If your jaw or tooth pain is associated with lightheadedness, sweating, or shortness of breath, you should see a doctor as soon as possible. Trouble swallowing or excessive pain or bleeding from gums: If you have a history of a weakened immune system, diabetes, or steroid use, you may be more susceptible  to infections. Infections can often be more severe and extensive or caused by unusual organisms. Dental and gum infections in people with these conditions may require more aggressive treatment. An abscess may need draining or IV antibiotics, for example.  MAKE SURE YOU   Understand these instructions. Will watch your condition. Will get help right away if you are not doing well or get worse.  Thank you for choosing an e-visit.  Your e-visit answers were reviewed by a board certified advanced clinical practitioner to complete your personal care plan.  Depending upon the condition, your plan could have included both over the counter or prescription medications.  Please review your pharmacy choice. Make sure the pharmacy is open so you can pick up prescription now. If there is a problem, you may contact your provider through [employer] and have the prescription routed to another pharmacy.  Your safety is important to us . If you have drug allergies check your prescription carefully.   For the next 24 hours you can use MyChart to ask questions about today's visit, request a non-urgent call back, or ask for a work or school excuse. You will get an email in the next two days asking about your experience. I hope that your e-visit has been valuable and will speed your recovery.  I have spent 5 minutes in review of e-visit questionnaire, review and updating patient chart, medical decision making and response to patient.   Bellarae Lizer W Ninah Moccio, NP

## 2024-07-05 ENCOUNTER — Other Ambulatory Visit: Payer: Self-pay

## 2024-07-05 ENCOUNTER — Other Ambulatory Visit (HOSPITAL_BASED_OUTPATIENT_CLINIC_OR_DEPARTMENT_OTHER): Payer: Self-pay

## 2024-07-05 MED ORDER — MOUNJARO 15 MG/0.5ML ~~LOC~~ SOAJ
SUBCUTANEOUS | 0 refills | Status: DC
  Filled 2024-07-05: qty 2, 28d supply, fill #0

## 2024-07-08 DIAGNOSIS — F4312 Post-traumatic stress disorder, chronic: Secondary | ICD-10-CM | POA: Diagnosis not present

## 2024-07-10 ENCOUNTER — Other Ambulatory Visit (HOSPITAL_BASED_OUTPATIENT_CLINIC_OR_DEPARTMENT_OTHER): Payer: Self-pay

## 2024-07-22 DIAGNOSIS — F4312 Post-traumatic stress disorder, chronic: Secondary | ICD-10-CM | POA: Diagnosis not present

## 2024-08-11 ENCOUNTER — Encounter

## 2024-08-13 ENCOUNTER — Other Ambulatory Visit (HOSPITAL_BASED_OUTPATIENT_CLINIC_OR_DEPARTMENT_OTHER): Payer: Self-pay

## 2024-08-13 MED ORDER — MOUNJARO 15 MG/0.5ML ~~LOC~~ SOAJ
15.0000 mg | SUBCUTANEOUS | 0 refills | Status: AC
  Filled 2024-08-13: qty 6, 84d supply, fill #0

## 2024-09-07 ENCOUNTER — Other Ambulatory Visit: Payer: Self-pay | Admitting: Cardiology

## 2024-09-07 DIAGNOSIS — R931 Abnormal findings on diagnostic imaging of heart and coronary circulation: Secondary | ICD-10-CM

## 2024-09-07 DIAGNOSIS — R7982 Elevated C-reactive protein (CRP): Secondary | ICD-10-CM

## 2024-09-08 MED ORDER — ROSUVASTATIN CALCIUM 10 MG PO TABS: 10.0000 mg | ORAL_TABLET | Freq: Every day | ORAL | 0 refills | Status: AC

## 2024-09-09 ENCOUNTER — Other Ambulatory Visit: Payer: Self-pay | Admitting: Family Medicine

## 2024-09-10 ENCOUNTER — Other Ambulatory Visit (HOSPITAL_BASED_OUTPATIENT_CLINIC_OR_DEPARTMENT_OTHER): Payer: Self-pay

## 2024-09-10 MED ORDER — LISDEXAMFETAMINE DIMESYLATE 30 MG PO CAPS
30.0000 mg | ORAL_CAPSULE | Freq: Every day | ORAL | 0 refills | Status: DC
  Filled 2024-09-10: qty 30, 30d supply, fill #0

## 2024-09-10 NOTE — Telephone Encounter (Signed)
Refilled once.  Needs office follow-up.  Eulas Post MD [facility] at [facility]

## 2024-09-17 ENCOUNTER — Other Ambulatory Visit (HOSPITAL_BASED_OUTPATIENT_CLINIC_OR_DEPARTMENT_OTHER): Payer: Self-pay

## 2024-09-17 ENCOUNTER — Encounter: Payer: Self-pay | Admitting: Family Medicine

## 2024-09-17 ENCOUNTER — Telehealth: Admitting: Family Medicine

## 2024-09-17 DIAGNOSIS — F909 Attention-deficit hyperactivity disorder, unspecified type: Secondary | ICD-10-CM | POA: Diagnosis not present

## 2024-09-17 MED ORDER — AMPHETAMINE-DEXTROAMPHETAMINE 10 MG PO TABS
10.0000 mg | ORAL_TABLET | Freq: Two times a day (BID) | ORAL | 0 refills | Status: DC
  Filled 2024-09-17: qty 60, 30d supply, fill #0

## 2024-09-17 NOTE — Progress Notes (Signed)
 Virtual Visit via Video Note  I [name] on 09/17/2024 at  8:45 AM EST by a video enabled telemedicine application and verified that I am speaking with the correct person using two identifiers.  Location patient: home Location provider:work or home office Persons participating in the virtual visit: patient, provider  I discussed the limitations of evaluation and management by telemedicine and the availability of in person appointments. The patient expressed understanding and agreed to proceed.   HPI:  Jessica Silva set up this virtual follow-up to reassess attention deficit disorder.  Her other medical problems include history of PVCs, elevated coronary calcium  score of 4 back in 2021, hypothyroidism.  She is followed by endocrinology.  Currently takes Mounjaro  and is on thyroid  replacement as well as Crestor .  That is being followed (ie labs) elsewhere.  She currently takes Vyvanse  30 mg daily.  She feels like though predominantly she needs effect from stimulant medication for focus around 10 AM to 3 PM.  She has also noticed some increased irritability in the evenings when Vyvanse  wears off.  She specifically has questions regarding possible use of immediate release ADD medication.  She has taken Adderall previously but this was the extended release version.  She has no cardiac history in terms of any chest pain or other cardiac concerns other than mildly elevated coronary calcium  score as above.  No history of hypertension  ROS: See pertinent positives and negatives per HPI.  Past Medical History:  Diagnosis Date   Abnormal uterine bleeding    Amenorrhea    Anxiety    Depression    GERD (gastroesophageal reflux disease)    Hashimoto's thyroiditis    Hormone disorder    Infertility, female    PONV (postoperative nausea and vomiting)    Premature ovarian failure 2002   Psychogenic air hunger     Past Surgical History:  Procedure Laterality Date   CESAREAN SECTION N/A  04/19/2018   Procedure: CESAREAN SECTION;  Surgeon: Danielle Rom, MD;  Location: [facility];  Service: Obstetrics;  Laterality: N/A;   DILATATION & CURETTAGE/HYSTEROSCOPY WITH MYOSURE N/A 05/07/2016   Procedure: DILATATION & CURETTAGE/HYSTEROSCOPY WITH MYOSURE;  Surgeon: Kate Hargis Nearing, MD;  Location: [facility];  Service: Gynecology;  Laterality: N/A;   DILATION AND CURETTAGE OF UTERUS     radioactive iodine treatment     thyroid    TONSILLECTOMY     TONSILLECTOMY AND ADENOIDECTOMY     WISDOM TOOTH EXTRACTION      Family History  Problem Relation Age of Onset   Hiatal hernia Mother    Atrial fibrillation Father    Hypertension Sister    Polycystic ovary syndrome Sister    Irritable bowel syndrome Brother    Hiatal hernia Maternal Grandmother    Irritable bowel syndrome Brother     SOCIAL HX: Non-smoker.  Married with 1 daughter  Current Medications[1]  EXAM:  VITALS per patient if applicable:  GENERAL: alert, oriented, appears well and in no acute distress  HEENT: atraumatic, conjunttiva clear, no obvious abnormalities on inspection of external nose and ears  NECK: normal movements of the head and neck  LUNGS: on inspection no signs of respiratory distress, breathing rate appears normal, no obvious gross SOB, gasping or wheezing  CV: no obvious cyanosis  MS: moves all visible extremities without noticeable abnormality  PSYCH/NEURO: pleasant and cooperative, no obvious depression or anxiety, speech and thought processing grossly intact  ASSESSMENT AND PLAN:  Discussed the following assessment and plan:  Attention deficit hyperactivity disorder (ADHD), unspecified ADHD type currently treated with Vyvanse .  She would like to consider immediate release medication such as Adderall.  We discussed switching her to Adderall 10 mg once or twice daily as needed and she will give some feedback in about a month.  Prescription sent for Adderall 10 mg twice daily  #60     I discussed the assessment and treatment plan with the patient. The patient was provided an opportunity to ask questions and all were answered. The patient agreed with the plan and demonstrated an understanding of the instructions.   The patient was advised to call back or seek an in-person evaluation if the symptoms worsen or if the condition fails to improve as anticipated.     Wolm Scarlet, MD      [1]  Current Outpatient Medications:    amphetamine -dextroamphetamine  (ADDERALL) 10 MG tablet, Take 1 tablet (10 mg total) by mouth 2 (two) times daily., Disp: 60 tablet, Rfl: 0   chlorhexidine  (PERIDEX ) 0.12 % solution, Use as directed 15 mLs in the mouth or throat 2 (two) times daily., Disp: 480 mL, Rfl: 0   estradiol  (VIVELLE -DOT) 0.05 MG/24HR patch, Place 1 patch onto the skin 2 (two) times a week., Disp: , Rfl:    metoprolol  succinate (TOPROL  XL) 25 MG 24 hr tablet, Take 1 tablet (25 mg total) by mouth daily., Disp: 90 tablet, Rfl: 3   MOUNJARO  15 MG/0.5ML Pen, Inject 15 mg into the skin once a week., Disp: 6 mL, Rfl: 0   MOUNJARO  15 MG/0.5ML Pen, Inject 15 mg into the skin once a week., Disp: 6 mL, Rfl: 0   nystatin  (MYCOSTATIN ) 100000 UNIT/ML suspension, Take 5 mLs (500,000 Units total) by mouth 4 (four) times daily., Disp: 100 mL, Rfl: 0   progesterone  (PROMETRIUM ) 100 MG capsule, Take 100 mg by mouth daily., Disp: , Rfl:    rosuvastatin  (CRESTOR ) 10 MG tablet, Take 1 tablet (10 mg total) by mouth daily., Disp: 30 tablet, Rfl: 0   Thyroid  (LEVOTHYROXINE -LIOTHYRONINE PO), Take 137 mcg by mouth daily., Disp: , Rfl:    trimethoprim -polymyxin b  (POLYTRIM ) ophthalmic solution, Apply 1-2 drops into affected eye QID x 5 days., Disp: 10 mL, Rfl: 0

## 2024-10-06 ENCOUNTER — Encounter: Payer: Self-pay | Admitting: Family Medicine

## 2024-10-08 ENCOUNTER — Other Ambulatory Visit (HOSPITAL_BASED_OUTPATIENT_CLINIC_OR_DEPARTMENT_OTHER): Payer: Self-pay

## 2024-10-08 MED ORDER — LISDEXAMFETAMINE DIMESYLATE 20 MG PO CAPS
20.0000 mg | ORAL_CAPSULE | Freq: Every day | ORAL | 0 refills | Status: AC
  Filled 2024-10-08: qty 30, 30d supply, fill #0

## 2024-10-08 NOTE — Telephone Encounter (Signed)
 Prescription sent to [facility]  Wolm LELON Scarlet MD [facility] at [facility]

## 2024-10-29 ENCOUNTER — Ambulatory Visit: Admitting: Cardiology

## 2024-12-23 ENCOUNTER — Ambulatory Visit: Admitting: Cardiology
